# Patient Record
Sex: Female | Born: 1945 | Race: White | Hispanic: No | Marital: Single | Smoking: Never smoker
Health system: Southern US, Community
[De-identification: ages and names within clinical notes are randomized; demographics above are authoritative.]

## PROBLEM LIST (undated history)

## (undated) ENCOUNTER — Inpatient Hospital Stay: Admission: EM | Payer: Self-pay | Source: Home / Self Care

## (undated) DIAGNOSIS — K219 Gastro-esophageal reflux disease without esophagitis: Secondary | ICD-10-CM

## (undated) DIAGNOSIS — C801 Malignant (primary) neoplasm, unspecified: Secondary | ICD-10-CM

## (undated) DIAGNOSIS — G4730 Sleep apnea, unspecified: Secondary | ICD-10-CM

## (undated) DIAGNOSIS — F32A Depression, unspecified: Secondary | ICD-10-CM

## (undated) DIAGNOSIS — I2510 Atherosclerotic heart disease of native coronary artery without angina pectoris: Secondary | ICD-10-CM

## (undated) DIAGNOSIS — J45909 Unspecified asthma, uncomplicated: Secondary | ICD-10-CM

## (undated) DIAGNOSIS — G20 Parkinson's disease: Secondary | ICD-10-CM

## (undated) DIAGNOSIS — I10 Essential (primary) hypertension: Secondary | ICD-10-CM

## (undated) DIAGNOSIS — I8390 Asymptomatic varicose veins of unspecified lower extremity: Secondary | ICD-10-CM

## (undated) DIAGNOSIS — K3184 Gastroparesis: Secondary | ICD-10-CM

## (undated) DIAGNOSIS — H538 Other visual disturbances: Secondary | ICD-10-CM

## (undated) DIAGNOSIS — M1990 Unspecified osteoarthritis, unspecified site: Secondary | ICD-10-CM

## (undated) DIAGNOSIS — M797 Fibromyalgia: Secondary | ICD-10-CM

## (undated) DIAGNOSIS — E785 Hyperlipidemia, unspecified: Secondary | ICD-10-CM

## (undated) DIAGNOSIS — F419 Anxiety disorder, unspecified: Secondary | ICD-10-CM

## (undated) DIAGNOSIS — D333 Benign neoplasm of cranial nerves: Secondary | ICD-10-CM

## (undated) DIAGNOSIS — G20A1 Parkinson's disease without dyskinesia, without mention of fluctuations: Secondary | ICD-10-CM

## (undated) DIAGNOSIS — K449 Diaphragmatic hernia without obstruction or gangrene: Secondary | ICD-10-CM

## (undated) DIAGNOSIS — R51 Headache: Secondary | ICD-10-CM

## (undated) DIAGNOSIS — I209 Angina pectoris, unspecified: Secondary | ICD-10-CM

## (undated) DIAGNOSIS — F329 Major depressive disorder, single episode, unspecified: Secondary | ICD-10-CM

## (undated) HISTORY — PX: COLON SURGERY: SHX602

## (undated) HISTORY — PX: KNEE ARTHROSCOPY: SUR90

## (undated) HISTORY — DX: Anxiety disorder, unspecified: F41.9

## (undated) HISTORY — PX: PORTACATH PLACEMENT: SHX2246

## (undated) HISTORY — PX: HERNIA REPAIR: SHX51

## (undated) HISTORY — PX: MANDIBLE SURGERY: SHX707

## (undated) HISTORY — DX: Fibromyalgia: M79.7

## (undated) HISTORY — PX: CATARACT EXTRACTION: SUR2

## (undated) HISTORY — DX: Malignant (primary) neoplasm, unspecified: C80.1

## (undated) HISTORY — DX: Gastroparesis: K31.84

## (undated) HISTORY — PX: EXCISION MORTON'S NEUROMA: SHX5013

## (undated) HISTORY — PX: TONSILLECTOMY: SUR1361

## (undated) HISTORY — DX: Unspecified osteoarthritis, unspecified site: M19.90

## (undated) HISTORY — PX: JOINT REPLACEMENT: SHX530

## (undated) HISTORY — PX: APPENDECTOMY: SHX54

## (undated) HISTORY — PX: LAPAROTOMY: SHX154

## (undated) HISTORY — PX: CARDIAC CATHETERIZATION: SHX172

## (undated) HISTORY — DX: Hyperlipidemia, unspecified: E78.5

## (undated) HISTORY — DX: Atherosclerotic heart disease of native coronary artery without angina pectoris: I25.10

## (undated) HISTORY — DX: Morbid (severe) obesity due to excess calories: E66.01

## (undated) HISTORY — DX: Depression, unspecified: F32.A

## (undated) HISTORY — DX: Diaphragmatic hernia without obstruction or gangrene: K44.9

## (undated) HISTORY — DX: Gastro-esophageal reflux disease without esophagitis: K21.9

## (undated) HISTORY — DX: Major depressive disorder, single episode, unspecified: F32.9

## (undated) HISTORY — PX: EYE SURGERY: SHX253

## (undated) HISTORY — DX: Essential (primary) hypertension: I10

## (undated) HISTORY — DX: Asymptomatic varicose veins of unspecified lower extremity: I83.90

## (undated) HISTORY — PX: FETAL SURGERY FOR CONGENITAL HERNIA: SHX1618

## (undated) HISTORY — PX: OTHER SURGICAL HISTORY: SHX169

## (undated) HISTORY — PX: REPLACEMENT TOTAL KNEE: SUR1224

## (undated) HISTORY — DX: Other visual disturbances: H53.8

## (undated) HISTORY — PX: FINGER REPLANTATION: SHX639

## (undated) HISTORY — PX: BACK SURGERY: SHX140

---

## 1964-02-26 HISTORY — PX: CHOLECYSTECTOMY: SHX55

## 1991-02-26 HISTORY — PX: ABDOMINAL HYSTERECTOMY: SHX81

## 1991-02-26 HISTORY — PX: HYSTEROTOMY: SHX1776

## 2001-10-09 ENCOUNTER — Encounter: Payer: Self-pay | Admitting: General Surgery

## 2001-10-09 ENCOUNTER — Encounter: Admission: RE | Admit: 2001-10-09 | Discharge: 2001-10-09 | Payer: Self-pay | Admitting: General Surgery

## 2001-10-19 ENCOUNTER — Encounter: Admission: RE | Admit: 2001-10-19 | Discharge: 2001-10-19 | Payer: Self-pay | Admitting: Internal Medicine

## 2001-10-19 ENCOUNTER — Encounter: Payer: Self-pay | Admitting: Internal Medicine

## 2001-11-12 ENCOUNTER — Encounter: Admission: RE | Admit: 2001-11-12 | Discharge: 2001-11-12 | Payer: Self-pay | Admitting: General Surgery

## 2001-11-12 ENCOUNTER — Encounter: Payer: Self-pay | Admitting: General Surgery

## 2001-11-16 ENCOUNTER — Encounter (INDEPENDENT_AMBULATORY_CARE_PROVIDER_SITE_OTHER): Payer: Self-pay | Admitting: Specialist

## 2001-11-16 ENCOUNTER — Ambulatory Visit (HOSPITAL_BASED_OUTPATIENT_CLINIC_OR_DEPARTMENT_OTHER): Admission: RE | Admit: 2001-11-16 | Discharge: 2001-11-17 | Payer: Self-pay | Admitting: General Surgery

## 2001-12-23 ENCOUNTER — Inpatient Hospital Stay (HOSPITAL_COMMUNITY): Admission: RE | Admit: 2001-12-23 | Discharge: 2001-12-30 | Payer: Self-pay | Admitting: General Surgery

## 2002-03-30 ENCOUNTER — Encounter: Payer: Self-pay | Admitting: Internal Medicine

## 2002-03-30 ENCOUNTER — Encounter: Admission: RE | Admit: 2002-03-30 | Discharge: 2002-03-30 | Payer: Self-pay | Admitting: Internal Medicine

## 2002-10-21 ENCOUNTER — Encounter: Payer: Self-pay | Admitting: General Surgery

## 2002-10-21 ENCOUNTER — Encounter: Admission: RE | Admit: 2002-10-21 | Discharge: 2002-10-21 | Payer: Self-pay | Admitting: General Surgery

## 2004-07-06 ENCOUNTER — Ambulatory Visit: Payer: Self-pay | Admitting: Cardiology

## 2004-07-13 ENCOUNTER — Inpatient Hospital Stay (HOSPITAL_BASED_OUTPATIENT_CLINIC_OR_DEPARTMENT_OTHER): Admission: RE | Admit: 2004-07-13 | Discharge: 2004-07-13 | Payer: Self-pay | Admitting: Cardiology

## 2004-07-20 ENCOUNTER — Ambulatory Visit: Payer: Self-pay | Admitting: Cardiology

## 2004-07-25 ENCOUNTER — Ambulatory Visit: Payer: Self-pay | Admitting: Cardiology

## 2004-07-26 ENCOUNTER — Ambulatory Visit: Payer: Self-pay | Admitting: Cardiology

## 2004-08-17 ENCOUNTER — Ambulatory Visit: Payer: Self-pay | Admitting: Cardiology

## 2004-09-07 ENCOUNTER — Ambulatory Visit (HOSPITAL_COMMUNITY): Admission: RE | Admit: 2004-09-07 | Discharge: 2004-09-08 | Payer: Self-pay | Admitting: Cardiology

## 2004-09-07 ENCOUNTER — Ambulatory Visit: Payer: Self-pay | Admitting: Cardiology

## 2004-09-20 ENCOUNTER — Ambulatory Visit: Payer: Self-pay | Admitting: Cardiology

## 2004-09-26 ENCOUNTER — Ambulatory Visit: Payer: Self-pay | Admitting: Cardiology

## 2004-10-04 ENCOUNTER — Ambulatory Visit: Payer: Self-pay | Admitting: Cardiology

## 2005-10-31 ENCOUNTER — Encounter: Admission: RE | Admit: 2005-10-31 | Discharge: 2005-10-31 | Payer: Self-pay | Admitting: General Surgery

## 2005-11-21 ENCOUNTER — Ambulatory Visit: Payer: Self-pay | Admitting: Cardiology

## 2005-11-28 ENCOUNTER — Ambulatory Visit: Payer: Self-pay | Admitting: Cardiology

## 2006-01-13 ENCOUNTER — Ambulatory Visit: Payer: Self-pay

## 2006-01-22 HISTORY — PX: OTHER SURGICAL HISTORY: SHX169

## 2006-01-23 ENCOUNTER — Inpatient Hospital Stay (HOSPITAL_COMMUNITY): Admission: RE | Admit: 2006-01-23 | Discharge: 2006-01-25 | Payer: Self-pay | Admitting: General Surgery

## 2006-03-13 ENCOUNTER — Ambulatory Visit: Payer: Self-pay | Admitting: Cardiology

## 2006-05-20 ENCOUNTER — Ambulatory Visit: Payer: Self-pay | Admitting: Cardiology

## 2006-08-08 ENCOUNTER — Ambulatory Visit: Payer: Self-pay | Admitting: Cardiovascular Disease

## 2006-08-12 ENCOUNTER — Ambulatory Visit: Payer: Self-pay

## 2007-02-06 ENCOUNTER — Encounter: Payer: Self-pay | Admitting: Gastroenterology

## 2007-07-07 ENCOUNTER — Ambulatory Visit (HOSPITAL_COMMUNITY): Admission: RE | Admit: 2007-07-07 | Discharge: 2007-07-07 | Payer: Self-pay | Admitting: Internal Medicine

## 2007-07-13 ENCOUNTER — Encounter (HOSPITAL_COMMUNITY): Admission: RE | Admit: 2007-07-13 | Discharge: 2007-08-12 | Payer: Self-pay | Admitting: Internal Medicine

## 2007-08-05 ENCOUNTER — Ambulatory Visit: Payer: Self-pay | Admitting: Cardiovascular Disease

## 2007-08-05 ENCOUNTER — Inpatient Hospital Stay (HOSPITAL_COMMUNITY): Admission: EM | Admit: 2007-08-05 | Discharge: 2007-08-08 | Payer: Self-pay | Admitting: Emergency Medicine

## 2007-08-06 ENCOUNTER — Encounter: Payer: Self-pay | Admitting: Gastroenterology

## 2007-08-07 ENCOUNTER — Encounter: Payer: Self-pay | Admitting: Internal Medicine

## 2007-08-11 ENCOUNTER — Encounter: Payer: Self-pay | Admitting: Gastroenterology

## 2007-08-12 ENCOUNTER — Ambulatory Visit: Payer: Self-pay | Admitting: Gastroenterology

## 2007-09-22 ENCOUNTER — Encounter: Payer: Self-pay | Admitting: Gastroenterology

## 2007-09-23 ENCOUNTER — Ambulatory Visit: Payer: Self-pay | Admitting: Gastroenterology

## 2007-09-23 DIAGNOSIS — D126 Benign neoplasm of colon, unspecified: Secondary | ICD-10-CM

## 2007-09-23 DIAGNOSIS — R195 Other fecal abnormalities: Secondary | ICD-10-CM | POA: Insufficient documentation

## 2007-09-23 DIAGNOSIS — E119 Type 2 diabetes mellitus without complications: Secondary | ICD-10-CM | POA: Insufficient documentation

## 2007-09-23 DIAGNOSIS — K3184 Gastroparesis: Secondary | ICD-10-CM

## 2007-09-23 DIAGNOSIS — D509 Iron deficiency anemia, unspecified: Secondary | ICD-10-CM | POA: Insufficient documentation

## 2007-09-23 DIAGNOSIS — IMO0001 Reserved for inherently not codable concepts without codable children: Secondary | ICD-10-CM

## 2007-09-23 LAB — CONVERTED CEMR LAB
Basophils Absolute: 0.1 10*3/uL (ref 0.0–0.1)
Basophils Relative: 1.1 % (ref 0.0–3.0)
Eosinophils Absolute: 0.3 10*3/uL (ref 0.0–0.7)
Lymphocytes Relative: 19.5 % (ref 12.0–46.0)
MCHC: 32.6 g/dL (ref 30.0–36.0)
Neutrophils Relative %: 70.4 % (ref 43.0–77.0)
Platelets: 378 10*3/uL (ref 150–400)
RBC: 3.81 M/uL — ABNORMAL LOW (ref 3.87–5.11)
WBC: 8.3 10*3/uL (ref 4.5–10.5)

## 2007-11-11 ENCOUNTER — Encounter: Payer: Self-pay | Admitting: Gastroenterology

## 2007-11-13 ENCOUNTER — Ambulatory Visit: Payer: Self-pay | Admitting: Gastroenterology

## 2007-11-13 ENCOUNTER — Encounter: Payer: Self-pay | Admitting: Gastroenterology

## 2007-12-04 ENCOUNTER — Telehealth: Payer: Self-pay | Admitting: Gastroenterology

## 2007-12-07 DIAGNOSIS — C182 Malignant neoplasm of ascending colon: Secondary | ICD-10-CM

## 2007-12-08 ENCOUNTER — Ambulatory Visit: Payer: Self-pay | Admitting: Gastroenterology

## 2007-12-08 LAB — CONVERTED CEMR LAB: Creatinine, Ser: 1.3 mg/dL — ABNORMAL HIGH (ref 0.4–1.2)

## 2007-12-10 ENCOUNTER — Encounter: Payer: Self-pay | Admitting: Gastroenterology

## 2007-12-17 ENCOUNTER — Encounter: Payer: Self-pay | Admitting: Gastroenterology

## 2007-12-28 ENCOUNTER — Inpatient Hospital Stay (HOSPITAL_COMMUNITY): Admission: RE | Admit: 2007-12-28 | Discharge: 2007-12-31 | Payer: Self-pay | Admitting: Surgery

## 2007-12-28 ENCOUNTER — Encounter (INDEPENDENT_AMBULATORY_CARE_PROVIDER_SITE_OTHER): Payer: Self-pay | Admitting: Surgery

## 2007-12-28 HISTORY — PX: HEMICOLECTOMY: SHX854

## 2008-01-06 ENCOUNTER — Ambulatory Visit: Payer: Self-pay | Admitting: Hematology and Oncology

## 2008-01-07 ENCOUNTER — Inpatient Hospital Stay (HOSPITAL_COMMUNITY): Admission: AD | Admit: 2008-01-07 | Discharge: 2008-01-13 | Payer: Self-pay | Admitting: Surgery

## 2008-01-20 ENCOUNTER — Encounter: Payer: Self-pay | Admitting: Gastroenterology

## 2008-02-02 ENCOUNTER — Encounter: Payer: Self-pay | Admitting: Gastroenterology

## 2008-03-07 ENCOUNTER — Ambulatory Visit (HOSPITAL_COMMUNITY): Admission: RE | Admit: 2008-03-07 | Discharge: 2008-03-07 | Payer: Self-pay | Admitting: Internal Medicine

## 2008-05-25 ENCOUNTER — Encounter: Payer: Self-pay | Admitting: Gastroenterology

## 2008-09-13 ENCOUNTER — Ambulatory Visit: Payer: Self-pay | Admitting: Cardiovascular Disease

## 2008-09-13 ENCOUNTER — Encounter: Payer: Self-pay | Admitting: Cardiovascular Disease

## 2008-09-13 DIAGNOSIS — R079 Chest pain, unspecified: Secondary | ICD-10-CM | POA: Insufficient documentation

## 2008-09-14 DIAGNOSIS — I2510 Atherosclerotic heart disease of native coronary artery without angina pectoris: Secondary | ICD-10-CM | POA: Insufficient documentation

## 2008-09-14 LAB — CONVERTED CEMR LAB
Basophils Absolute: 0 10*3/uL (ref 0.0–0.1)
Basophils Relative: 0.1 % (ref 0.0–3.0)
Calcium: 10 mg/dL (ref 8.4–10.5)
Eosinophils Relative: 4.1 % (ref 0.0–5.0)
GFR calc non Af Amer: 76.39 mL/min (ref >60)
Glucose, Bld: 123 mg/dL — ABNORMAL HIGH (ref 70–99)
HCT: 37.4 % (ref 36.0–46.0)
Hemoglobin: 12.8 g/dL (ref 12.0–15.0)
Lymphocytes Relative: 34.9 % (ref 12.0–46.0)
Lymphs Abs: 2.3 10*3/uL (ref 0.7–4.0)
Monocytes Relative: 5.5 % (ref 3.0–12.0)
Neutro Abs: 3.7 10*3/uL (ref 1.4–7.7)
Potassium: 5.3 meq/L — ABNORMAL HIGH (ref 3.5–5.1)
RBC: 4.11 M/uL (ref 3.87–5.11)
RDW: 13 % (ref 11.5–14.6)
Sodium: 140 meq/L (ref 135–145)
WBC: 6.7 10*3/uL (ref 4.5–10.5)
aPTT: 29.3 s — ABNORMAL HIGH (ref 21.7–28.8)

## 2008-09-15 ENCOUNTER — Ambulatory Visit: Payer: Self-pay | Admitting: Cardiovascular Disease

## 2008-09-15 ENCOUNTER — Inpatient Hospital Stay (HOSPITAL_COMMUNITY): Admission: RE | Admit: 2008-09-15 | Discharge: 2008-09-16 | Payer: Self-pay | Admitting: Cardiovascular Disease

## 2008-09-20 ENCOUNTER — Telehealth (INDEPENDENT_AMBULATORY_CARE_PROVIDER_SITE_OTHER): Payer: Self-pay | Admitting: *Deleted

## 2008-09-21 ENCOUNTER — Encounter (INDEPENDENT_AMBULATORY_CARE_PROVIDER_SITE_OTHER): Payer: Self-pay | Admitting: Nurse Practitioner

## 2008-09-22 ENCOUNTER — Encounter (INDEPENDENT_AMBULATORY_CARE_PROVIDER_SITE_OTHER): Payer: Self-pay | Admitting: *Deleted

## 2008-09-27 ENCOUNTER — Ambulatory Visit: Payer: Self-pay | Admitting: Cardiology

## 2008-09-27 ENCOUNTER — Encounter: Payer: Self-pay | Admitting: Physician Assistant

## 2008-09-27 DIAGNOSIS — I5032 Chronic diastolic (congestive) heart failure: Secondary | ICD-10-CM | POA: Insufficient documentation

## 2008-10-10 ENCOUNTER — Telehealth: Payer: Self-pay | Admitting: Cardiovascular Disease

## 2008-10-12 ENCOUNTER — Encounter: Payer: Self-pay | Admitting: Cardiovascular Disease

## 2008-10-14 ENCOUNTER — Encounter: Payer: Self-pay | Admitting: Cardiovascular Disease

## 2008-10-26 ENCOUNTER — Ambulatory Visit: Payer: Self-pay | Admitting: Pulmonary Disease

## 2008-10-26 DIAGNOSIS — R059 Cough, unspecified: Secondary | ICD-10-CM | POA: Insufficient documentation

## 2008-10-26 DIAGNOSIS — R05 Cough: Secondary | ICD-10-CM

## 2008-11-11 ENCOUNTER — Encounter (INDEPENDENT_AMBULATORY_CARE_PROVIDER_SITE_OTHER): Payer: Self-pay | Admitting: *Deleted

## 2008-11-25 ENCOUNTER — Encounter (INDEPENDENT_AMBULATORY_CARE_PROVIDER_SITE_OTHER): Payer: Self-pay | Admitting: *Deleted

## 2008-12-14 ENCOUNTER — Ambulatory Visit: Payer: Self-pay | Admitting: Gastroenterology

## 2008-12-14 DIAGNOSIS — Z85038 Personal history of other malignant neoplasm of large intestine: Secondary | ICD-10-CM | POA: Insufficient documentation

## 2008-12-19 ENCOUNTER — Telehealth: Payer: Self-pay | Admitting: Gastroenterology

## 2008-12-27 ENCOUNTER — Encounter (INDEPENDENT_AMBULATORY_CARE_PROVIDER_SITE_OTHER): Payer: Self-pay | Admitting: *Deleted

## 2009-01-03 ENCOUNTER — Ambulatory Visit: Payer: Self-pay | Admitting: Gastroenterology

## 2009-01-03 ENCOUNTER — Encounter: Payer: Self-pay | Admitting: Gastroenterology

## 2009-01-12 ENCOUNTER — Ambulatory Visit (HOSPITAL_COMMUNITY): Admission: RE | Admit: 2009-01-12 | Discharge: 2009-01-12 | Payer: Self-pay | Admitting: Hematology and Oncology

## 2009-01-23 ENCOUNTER — Encounter: Payer: Self-pay | Admitting: Gastroenterology

## 2009-01-27 ENCOUNTER — Telehealth: Payer: Self-pay | Admitting: Cardiovascular Disease

## 2009-01-31 ENCOUNTER — Telehealth: Payer: Self-pay | Admitting: Cardiovascular Disease

## 2009-02-01 ENCOUNTER — Encounter (INDEPENDENT_AMBULATORY_CARE_PROVIDER_SITE_OTHER): Payer: Self-pay | Admitting: *Deleted

## 2009-02-06 ENCOUNTER — Ambulatory Visit: Payer: Self-pay | Admitting: Cardiovascular Disease

## 2009-02-06 DIAGNOSIS — I10 Essential (primary) hypertension: Secondary | ICD-10-CM | POA: Insufficient documentation

## 2009-02-07 ENCOUNTER — Telehealth (INDEPENDENT_AMBULATORY_CARE_PROVIDER_SITE_OTHER): Payer: Self-pay | Admitting: *Deleted

## 2009-02-08 ENCOUNTER — Ambulatory Visit: Payer: Self-pay | Admitting: Cardiology

## 2009-02-08 ENCOUNTER — Ambulatory Visit: Payer: Self-pay

## 2009-02-14 ENCOUNTER — Encounter (HOSPITAL_COMMUNITY): Admission: RE | Admit: 2009-02-14 | Discharge: 2009-05-01 | Payer: Self-pay | Admitting: Cardiovascular Disease

## 2009-02-14 ENCOUNTER — Encounter: Payer: Self-pay | Admitting: Cardiology

## 2009-02-14 ENCOUNTER — Ambulatory Visit: Payer: Self-pay

## 2009-02-22 ENCOUNTER — Encounter: Admission: RE | Admit: 2009-02-22 | Discharge: 2009-02-22 | Payer: Self-pay | Admitting: Surgery

## 2009-02-22 ENCOUNTER — Encounter (INDEPENDENT_AMBULATORY_CARE_PROVIDER_SITE_OTHER): Payer: Self-pay | Admitting: *Deleted

## 2009-02-27 ENCOUNTER — Telehealth: Payer: Self-pay | Admitting: Gastroenterology

## 2009-03-14 ENCOUNTER — Ambulatory Visit (HOSPITAL_COMMUNITY): Admission: RE | Admit: 2009-03-14 | Discharge: 2009-03-14 | Payer: Self-pay | Admitting: Anesthesiology

## 2009-03-15 ENCOUNTER — Ambulatory Visit: Payer: Self-pay | Admitting: Gastroenterology

## 2009-03-15 ENCOUNTER — Ambulatory Visit (HOSPITAL_COMMUNITY): Admission: RE | Admit: 2009-03-15 | Discharge: 2009-03-15 | Payer: Self-pay | Admitting: Gastroenterology

## 2009-03-16 ENCOUNTER — Inpatient Hospital Stay (HOSPITAL_COMMUNITY): Admission: RE | Admit: 2009-03-16 | Discharge: 2009-03-21 | Payer: Self-pay | Admitting: Surgery

## 2009-03-16 ENCOUNTER — Encounter (INDEPENDENT_AMBULATORY_CARE_PROVIDER_SITE_OTHER): Payer: Self-pay | Admitting: Surgery

## 2009-03-16 DIAGNOSIS — C189 Malignant neoplasm of colon, unspecified: Secondary | ICD-10-CM | POA: Insufficient documentation

## 2009-03-25 ENCOUNTER — Inpatient Hospital Stay (HOSPITAL_COMMUNITY): Admission: EM | Admit: 2009-03-25 | Discharge: 2009-03-30 | Payer: Self-pay | Admitting: Emergency Medicine

## 2009-03-25 ENCOUNTER — Encounter: Payer: Self-pay | Admitting: Emergency Medicine

## 2009-04-11 ENCOUNTER — Encounter: Payer: Self-pay | Admitting: Gastroenterology

## 2009-04-18 ENCOUNTER — Encounter: Payer: Self-pay | Admitting: Gastroenterology

## 2009-04-24 ENCOUNTER — Encounter: Payer: Self-pay | Admitting: Gastroenterology

## 2009-04-24 ENCOUNTER — Encounter: Payer: Self-pay | Admitting: Cardiovascular Disease

## 2009-04-24 ENCOUNTER — Telehealth: Payer: Self-pay | Admitting: Cardiovascular Disease

## 2009-05-03 ENCOUNTER — Ambulatory Visit (HOSPITAL_COMMUNITY): Admission: RE | Admit: 2009-05-03 | Discharge: 2009-05-03 | Payer: Self-pay | Admitting: Hematology and Oncology

## 2009-05-19 ENCOUNTER — Telehealth: Payer: Self-pay | Admitting: Cardiovascular Disease

## 2009-05-22 ENCOUNTER — Encounter: Payer: Self-pay | Admitting: Gastroenterology

## 2009-06-05 ENCOUNTER — Encounter: Payer: Self-pay | Admitting: Gastroenterology

## 2009-06-05 ENCOUNTER — Encounter: Payer: Self-pay | Admitting: Cardiovascular Disease

## 2009-07-10 ENCOUNTER — Encounter: Payer: Self-pay | Admitting: Gastroenterology

## 2009-08-04 ENCOUNTER — Encounter: Payer: Self-pay | Admitting: Cardiovascular Disease

## 2009-08-04 ENCOUNTER — Encounter: Payer: Self-pay | Admitting: Gastroenterology

## 2009-08-07 ENCOUNTER — Ambulatory Visit: Payer: Self-pay | Admitting: Cardiovascular Disease

## 2009-08-31 ENCOUNTER — Encounter: Payer: Self-pay | Admitting: Gastroenterology

## 2009-09-18 ENCOUNTER — Telehealth (INDEPENDENT_AMBULATORY_CARE_PROVIDER_SITE_OTHER): Payer: Self-pay | Admitting: *Deleted

## 2009-09-20 ENCOUNTER — Ambulatory Visit: Payer: Self-pay | Admitting: Gastroenterology

## 2009-12-22 ENCOUNTER — Ambulatory Visit (HOSPITAL_COMMUNITY): Admission: RE | Admit: 2009-12-22 | Discharge: 2009-12-22 | Payer: Self-pay | Admitting: Family Medicine

## 2010-01-12 ENCOUNTER — Encounter (INDEPENDENT_AMBULATORY_CARE_PROVIDER_SITE_OTHER): Payer: Self-pay | Admitting: *Deleted

## 2010-02-23 ENCOUNTER — Encounter (INDEPENDENT_AMBULATORY_CARE_PROVIDER_SITE_OTHER): Payer: Self-pay | Admitting: *Deleted

## 2010-03-01 ENCOUNTER — Encounter (INDEPENDENT_AMBULATORY_CARE_PROVIDER_SITE_OTHER): Payer: Self-pay | Admitting: *Deleted

## 2010-03-01 ENCOUNTER — Ambulatory Visit
Admission: RE | Admit: 2010-03-01 | Discharge: 2010-03-01 | Payer: Self-pay | Source: Home / Self Care | Attending: Gastroenterology | Admitting: Gastroenterology

## 2010-03-15 ENCOUNTER — Other Ambulatory Visit: Payer: Self-pay | Admitting: Gastroenterology

## 2010-03-15 ENCOUNTER — Ambulatory Visit
Admission: RE | Admit: 2010-03-15 | Discharge: 2010-03-15 | Payer: Self-pay | Source: Home / Self Care | Attending: Gastroenterology | Admitting: Gastroenterology

## 2010-03-18 ENCOUNTER — Encounter: Payer: Self-pay | Admitting: Gastroenterology

## 2010-03-19 LAB — GLUCOSE, CAPILLARY: Glucose-Capillary: 190 mg/dL — ABNORMAL HIGH (ref 70–99)

## 2010-03-21 ENCOUNTER — Encounter: Payer: Self-pay | Admitting: Gastroenterology

## 2010-03-29 NOTE — Miscellaneous (Signed)
Summary: [facility] previsit  Clinical Lists Changes  Medications: Added new medication of MOVIPREP 100 GM  SOLR (PEG-KCL-NACL-NASULF-NA ASC-C) As per prep instructions. - Signed Rx of MOVIPREP 100 GM  SOLR (PEG-KCL-NACL-NASULF-NA ASC-C) As per prep instructions.;  #1 x 0;  Signed;  Entered by: Karl Bales RN;  Authorized by: Louis Meckel MD;  Method used: Electronically to [facility] [address] [place], [place], [place]  [address], Ph: [phone] or [phone], Fax: [phone] Allergies: Removed allergy or adverse reaction of * IVP DYE Observations: Added new observation of ALLERGY REV: Done (03/01/2010 9:23)    Prescriptions: MOVIPREP 100 GM  SOLR (PEG-KCL-NACL-NASULF-NA ASC-C) As per prep instructions.  #1 x 0   Entered by:   Karl Bales RN   Authorized by:   Louis Meckel MD   Signed by:   Karl Bales RN on 03/01/2010   Method used:   Electronically to        [facility] [address] (retail)       [address]       [place], [place]  [address]       Ph: [phone] or [phone]       Fax: [phone]   RxID:   [id number]

## 2010-03-29 NOTE — Letter (Signed)
Summary: [facility]  [facility]   Imported By: Sherian Rein 07/27/2009 15:16:00  _____________________________________________________________________  External Attachment:    Type:   Image     Comment:   External Document

## 2010-03-29 NOTE — Letter (Signed)
Summary: [facility] Note  [facility] Note   Imported By: Roderic Ovens 05/11/2009 11:35:16  _____________________________________________________________________  External Attachment:    Type:   Image     Comment:   External Document

## 2010-03-29 NOTE — Progress Notes (Signed)
Summary: Need Plavix  ordered from [facility]  Phone Note Call from Patient Call back at [facility] Phone [phone]   Caller: Patient Summary of Call: Pt need her Plavix ordeer from [employer]. Initial call taken by: Judie Grieve,  May 19, 2009 1:56 PM  Follow-up for Phone Call        CMA s/w rep at [facility] and CMA ordered plavix.. Rep stated rx should be in our office in 3-5 days. Danielle Rankin, CMA  May 19, 2009 3:28 PM      Appended Document: Need Plavix  ordered from [facility] Plavix arrived in the office.  LMOM for pt that medication has been placed at the front desk for pick-up. Customer PO  number [phone].  Order number [id number].

## 2010-03-29 NOTE — Letter (Signed)
Summary: [facility]   Imported By: Lester [place] 04/28/2009 08:31:32  _____________________________________________________________________  External Attachment:    Type:   Image     Comment:   External Document

## 2010-03-29 NOTE — Procedures (Addendum)
Summary: Colonoscopy  Patient: [name] Note: All result statuses are Final unless otherwise noted.  Tests: (1) Colonoscopy (COL)   COL Colonoscopy           DONE     [facility]     [address]     [place], [place]  [address]           COLONOSCOPY PROCEDURE REPORT           PATIENT:  [name], [name]  MR#:  [id number]     BIRTHDATE:  1945-08-19, 65 yrs. old  GENDER:  female           ENDOSCOPIST:  Barbette Hair. Arlyce Dice, MD     Referred by:  Isabel Caprice, M.D.     [name], M.D.           PROCEDURE DATE:  03/15/2010     PROCEDURE:  Colonoscopy with snare polypectomy     ASA CLASS:  Class III     INDICATIONS:  1) screening  2) history of colon cancer h/o colon     Ca 2010; recurrent Ca 2011;           MEDICATIONS:   Fentanyl 100 mcg IV, Versed 9 mg IV           DESCRIPTION OF PROCEDURE:   After the risks benefits and     alternatives of the procedure were thoroughly explained, informed     consent was obtained.  Digital rectal exam was performed and     revealed no abnormalities.   The LB CF-[id number] endoscope     was introduced through the anus and advanced to the anastomosis,     without limitations.  The quality of the prep was excellent, using     MoviPrep.  The instrument was then slowly withdrawn as the colon     was fully examined.     <<PROCEDUREIMAGES>>           FINDINGS:  A sessile polyp was found in the sigmoid colon. It was     3 mm in size. It was found 28 cm from the point of entry. Polyp     was snared without cautery. Retrieval was successful (see     image11). snare polyp  This was otherwise a normal examination of     the colon (see image1, image2, image3, image6, image7, image12,     and image13).   Retroflexed views in the rectum revealed no     abnormalities.    The time to anastamosis =  5.0  minutes. The     scope was then withdrawn (time =  10.75  min) from the patient and     the procedure completed.           COMPLICATIONS:   None           ENDOSCOPIC IMPRESSION:     1) 3 mm sessile polyp in the sigmoid colon     2) Otherwise normal examination     RECOMMENDATIONS:     1) Colonoscopy 3 years           REPEAT EXAM:   3 year(s) Colonoscopy           ______________________________     Barbette Hair. Arlyce Dice, MD           CC:           n.     eSIGNED:   Barbette Hair. Arlyce Dice at  03/15/2010 09:34 AM           Kendzierski, [name], [phone]  Note: An exclamation mark (!) indicates a result that was not dispersed into the flowsheet. Document Creation Date: 03/15/2010 9:35 AM _______________________________________________________________________  (1) Order result status: Final Collection or observation date-time: 03/15/2010 09:27 Requested date-time:  Receipt date-time:  Reported date-time:  Referring Physician:   Ordering Physician: Melvia Heaps [phone]) Specimen Source:  Source: Launa Grill Order Number: [id number] Lab site:   Appended Document: Colonoscopy     Procedures Next Due Date:    Colonoscopy: 02/2013

## 2010-03-29 NOTE — Miscellaneous (Signed)
  Clinical Lists Changes  Observations: Added new observation of NUCLEAR NOS: Exercise Capacity: Lexiscan study with no exercise. ECG Impression: No significant ST segment change suggestive of ischemia. Overall Impression: There is mild apical thinning but  no sign of scar or ischemia. (02/08/2009 12:24)      Nuclear Study  Procedure date:  02/08/2009  Findings:      Exercise Capacity: Lexiscan study with no exercise. ECG Impression: No significant ST segment change suggestive of ischemia. Overall Impression: There is mild apical thinning but  no sign of scar or ischemia.

## 2010-03-29 NOTE — Progress Notes (Signed)
  Phone Note From Other Clinic   Caller: Lafonda Mosses [facility]  [phone] option [phone] Call For: Dr. Arlyce Dice Initial call taken by: Karna Christmas,  September 18, 2009 3:58 PM

## 2010-03-29 NOTE — Progress Notes (Signed)
Summary: COLON/TATTOO SCHEDULED  Phone Note From Other Clinic   Caller: Dr.Gross nurse--Alisha Summary of Call: Pt. had a Colonoscopy on 01-03-09, recurrent CA found.  Dr.Gross ordered a BE, site not identified. Dr.Gross wants pt. to have a Colonoscopy on 03-15-09 and have Dr.Kaplan tattoo the area for pt. surgery scheduled on 03-16-09. Elease Hashimoto will give pt. prep instructions and Plavix directions. The procedure is scheduled for 03-15-09 at 8:30am at [facility]. Message left for patient to callback.  Initial call taken by: Laureen Ochs LPN,  February 27, 2009 2:23 PM  Follow-up for Phone Call        All instructions reviewed with pt. by phone. Pt. instructed to call back as needed.  Follow-up by: Laureen Ochs LPN,  February 27, 2009 4:14 PM

## 2010-03-29 NOTE — Letter (Signed)
Summary: [facility]  [facility]   Imported By: Lester [place] 06/15/2009 09:49:15  _____________________________________________________________________  External Attachment:    Type:   Image     Comment:   External Document

## 2010-03-29 NOTE — Letter (Signed)
Summary: [facility]  [facility]   Imported By: Lester [place] 04/28/2009 08:49:22  _____________________________________________________________________  External Attachment:    Type:   Image     Comment:   External Document

## 2010-03-29 NOTE — Letter (Signed)
Summary: Results Letter  [facility]  [address] [place], [place] [address]   Phone: [phone]  Fax: [phone]        March 21, 2010 MRN: [id number]    [facility] [address] [place], [place]  [address]    Dear Ms. Keir,  Your colon biopsy results did not show any remarkable findings. In view of your history of colon cancer, however, I recommend followup colonoscopy in 3 years. Please continue with the recommendations previously discussed.  Should you have any further questions or immediate concers, feel free to contact me.  Sincerely,  Barbette Hair. Arlyce Dice, M.D., [facility]          Sincerely,  Louis Meckel MD  This letter has been electronically signed by your physician.  Appended Document: Results Letter LETTER MAILED

## 2010-03-29 NOTE — Assessment & Plan Note (Signed)
Summary: discuss colon//hx of COL cancer--ch.   History of Present Illness Visit Type: Follow-up Visit Primary GI MD: Melvia Heaps MD [facility] Primary Provider: [name], MD Requesting Provider: Isabel Caprice, MD Chief Complaint: Discuss colonoscopy/hx. of Colon Ca  c/o bilateral hernias hurting. History of Present Illness:   Ms. [name] has returned for followup of her colon cancer.  She underwent a segmental resection of the transverse colon in January, 2011.  This was followed by chemotherapy.  She has no specific GI complaints at this time.   GI Review of Systems      Denies abdominal pain, acid reflux, belching, bloating, chest pain, dysphagia with liquids, dysphagia with solids, heartburn, loss of appetite, nausea, vomiting, vomiting blood, weight loss, and  weight gain.        Denies anal fissure, black tarry stools, change in bowel habit, constipation, diarrhea, diverticulosis, fecal incontinence, heme positive stool, hemorrhoids, irritable bowel syndrome, jaundice, light color stool, liver problems, rectal bleeding, and  rectal pain.    Current Medications (verified): 1)  Miralax   Powd (Polyethylene Glycol 3350) .Marland KitchenMarland KitchenMarland Kitchen 17 Grams Daily 2)  Aspirin 81 Mg  Tbec (Aspirin) .... One Tablet Every Day 3)  Lantus 100 Unit/ml  Soln (Insulin Glargine) .... 55 Units Once A Day 4)  Metformin Hcl 1000 Mg Tabs (Metformin Hcl) .... 2 By Mouth Two Times A Day 5)  Multivitamins  Tabs (Multiple Vitamin) .Marland Kitchen.. 1 By Mouth Once Daily 6)  Folic Acid   Powd (Folic Acid) .Marland Kitchen.. 1 By Mouth Once Daily 7)  Fish Oil   Oil (Fish Oil) .Marland Kitchen.. 1 By Mouth Once Daily 8)  Nitroglycerin 0.4 Mg Subl (Nitroglycerin) .... One Tablet Under Tongue Every 5 Minutes As Needed For Chest Pain---May Repeat Times Three 9)  Furosemide 20 Mg Tabs (Furosemide) .... As Needed 10)  Crestor 10 Mg Tabs (Rosuvastatin Calcium) .... One Tablet By Mouth Once Daily 11)  Prozac 40 Mg Caps (Fluoxetine Hcl) .... Take 1 Capsule By Mouth Once A  Day 12)  Losartan Potassium 50 Mg Tabs (Losartan Potassium) .... Take One Tablet By Mouth Daily 13)  Vitamin D3 1000 Unit Tabs (Cholecalciferol) .... Take 1 Tablet By Mouth Once A Day 14)  Vitamin C 500 Mg  Tabs (Ascorbic Acid) .... Take 1 Tablet By Mouth Once A Day  Allergies (verified): 1)  ! Morphine 2)  ! Codeine 3)  ! Penicillin 4)  ! * Ivp Dye 5)  ! * Latex  Past History:  Past Medical History: Reviewed history from 02/06/2009 and no changes required. 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 Cataract Extraction Cholecystectomy Hernia Surgery Knee Arthroscopy Knee Replacement Tonsillectomy Right hemicolectomy 12/28/07 for adenocarcinoma of ascending colon Hysterectomy - 1993 Herniorrhaphy for recurrent ventral hernia -- 01/22/06 Laparotomy for small bowel obstruction resection of acoustic neuroma Stent Surgery   Review of Systems  The patient denies allergy/sinus, anemia, anxiety-new, arthritis/joint pain, back pain, blood in urine, breast changes/lumps, change in vision, confusion, cough, coughing up blood, depression-new, fainting, fatigue, fever, headaches-new, hearing problems, heart murmur, heart rhythm changes, itching,  muscle pains/cramps, night sweats, nosebleeds, shortness of breath, skin rash, sleeping problems, sore throat, swelling of feet/legs, swollen lymph glands, thirst - excessive, urination - excessive, urination  changes/pain, urine leakage, vision changes, and voice change.    Vital Signs:  Patient profile:   65 year old female Height:      65 inches Weight:      254 pounds BMI:     42.42 Pulse rate:   68 / minute Pulse rhythm:   regular BP sitting:   118 / 74  (left arm)  Vitals Entered By: Milford Cage NCMA (September 20, 2009 3:22 PM)   Impression & Recommendations:  Problem # 1:  PERSONAL HISTORY MALIG NEOPLASM LARGE INTESTINE (ICD-V10.05) She had a primary cancer  2 years ago and a metachronous lesion at last colonoscopy.  She will be scheduled  for followup colonoscopy in January, 2012.  Problem # 2:  CAD, NATIVE VESSEL (ICD-414.01) Assessment: Comment Only  Patient Instructions: 1)  Copy sent to : Isabel Caprice, MD/  [name], MD, Estelle Grumbles, MD 2)  Recall for colonoscopy in January 2012 3)  The medication list was reviewed and reconciled.  All changed / newly prescribed medications were explained.  A complete medication list was provided to the patient / caregiver.

## 2010-03-29 NOTE — Progress Notes (Signed)
Summary: come off plavix  Phone Note From Other Clinic   Caller: tina office [phone] Request: Talk with Nurse Details for Reason: pt need to come off plavix 7 days prior for porta cath.  Initial call taken by: Lorne Skeens,  April 24, 2009 2:48 PM  Follow-up for Phone Call        I spoke with Inetta Fermo and this pt is scheduled for porta cath placement on 05/03/09 at [facility].  Per protocol the pt would need to hold Plavix 7 days prior to procedure.  The pt can continue ASA.  Note should be faxed to [phone].  I will discuss this pt with Dr Excell Seltzer.  Julieta Gutting, RN, BSN  April 24, 2009 3:19 PM  Additional Follow-up for Phone Call Additional follow up Details #1::        this is ok. she should start plavix back after port-a-cath is placed Additional Follow-up by: Norva Karvonen, MD,  April 24, 2009 4:40 PM

## 2010-03-29 NOTE — Letter (Signed)
Summary: [facility]   [facility]   Imported By: Roderic Ovens 07/10/2009 13:49:28  _____________________________________________________________________  External Attachment:    Type:   Image     Comment:   External Document

## 2010-03-29 NOTE — Letter (Signed)
Summary: Diabetic Instructions  [facility]  [address]   [place], [place] [address]   Phone: [phone]  Fax: [phone]    [name] [birth year] MRN: [id number]    x    ORAL DIABETIC MEDICATION INSTRUCTIONS  The day before your procedure:   Take your diabetic pill as you do normally  The day of your procedure:   Do not take your diabetic pill    We will check your blood sugar levels during the admission process and again in Recovery before discharging you home  ________________________________________________________________________   x    INSULIN (LONG ACTING) MEDICATION INSTRUCTIONS (Lantus, NPH, 70/30, Humulin, Novolin-N)   The day before your procedure:   Take  your regular evening dose    The day of your procedure:   Do not take your morning dose    x     INSULIN (SHORT ACTING) MEDICATION INSTRUCTIONS (Regular, Humulog, Novolog)   The day before your procedure:   Do not take your evening dose   The day of your procedure:   Do not take your morning dose

## 2010-03-29 NOTE — Procedures (Signed)
Summary: Colonoscopy  Patient: [name] Note: All result statuses are Final unless otherwise noted.  Tests: (1) Colonoscopy (COL)   COL Colonoscopy           DONE     [facility]     [address] [place], [place]  [address]           COLONOSCOPY PROCEDURE REPORT           PATIENT:  [name]  MR#:  [id number]     BIRTHDATE:  1945/05/26, 64 yrs. old  GENDER:  female           ENDOSCOPIST:  Laura Hair. Arlyce Dice, MD     Referred by:           PROCEDURE DATE:  03/15/2009     PROCEDURE:  Colonoscopy with snare polypectomy, Colonoscopy with     submucosal injection     ASA CLASS:  Class II     INDICATIONS:  surgical marking recurrent adenoCa           MEDICATIONS:   Fentanyl 80 mg, Versed 8.5 mg IV, Benadryl 25 mg           DESCRIPTION OF PROCEDURE:   After the risks benefits and     alternatives of the procedure were thoroughly explained, informed     consent was obtained.  Digital rectal exam was performed and     revealed no abnormalities.   The [id number] ([id number]) endoscope was     introduced through the anus and advanced to the anastamosis,     limited by poor preparation.  Large amount of retained solid and     liquid stool  The quality of the prep was poor, using MiraLax.     The instrument was then slowly withdrawn as the colon was fully     examined.     <<PROCEDUREIMAGES>>           FINDINGS:  A mass was found. Previously described mass just distal     to anastamosis. submucosal injection 4cc [place] ink was injected in     4 separate areas around mass (see image002).  A sessile polyp was     found in the distal transverse colon. It was 3 mm in size. Polyp     was snared without cautery. Retrieval was successful. snare polyp     This was otherwise a normal examination of the colon (see image003,     image005, image006, and image008).   Retroflexed views in the     rectum revealed no abnormalities.    The scope was then withdrawn     from the patient  and the procedure completed.           COMPLICATIONS:  None           ENDOSCOPIC IMPRESSION:     1) Malignant Mass just distal to anastamosis     2) 3 mm sessile polyp in the distal transverse colon     3) Otherwise normal examination     RECOMMENDATIONS:     1)Surgery     2) reprep for surgery           REPEAT EXAM:  1 year           ______________________________     Laura Hair. Arlyce Dice, MD           CC:  Laura Soda, MD  n.     eSIGNED:   Barbette Hair. [name] at 03/15/2009 09:41 AM           [name], [id number]  Note: An exclamation mark (!) indicates a result that was not dispersed into the flowsheet. Document Creation Date: 03/15/2009 9:42 AM _______________________________________________________________________  (1) Order result status: Final Collection or observation date-time: 03/15/2009 09:29 Requested date-time:  Receipt date-time:  Reported date-time:  Referring Physician:   Ordering Physician: [name] [phone]) Specimen Source:  Source: Launa Grill Order Number: [id number] Lab site:   Appended Document: Colonoscopy Recall is in IDX for 02/2010.

## 2010-03-29 NOTE — Assessment & Plan Note (Signed)
Summary: 6 month rov   Visit Type:  6 months follow up Referring Provider:  n/a Primary Provider:  [name], MD  CC:  Some chest pains at night.  History of Present Illness: 65 year-old presenting for follow-management of her CAD. She underwent colon resection for recurrent colonic malignancy earlier this year. She finished chemotherapy last week, and is slowly regaining her strength. She denies chest pain, edema, orthopnea, or PND, but does complain of generalized fatigue and dyspnea with exertion.  Current Medications (verified): 1)  Miralax   Powd (Polyethylene Glycol 3350) .Marland KitchenMarland KitchenMarland Kitchen 17 Grams Daily 2)  Aspirin 81 Mg  Tbec (Aspirin) .... One Tablet Every Day 3)  Lantus 100 Unit/ml  Soln (Insulin Glargine) .... 55 Units Once A Day 4)  Metformin Hcl 1000 Mg Tabs (Metformin Hcl) .... 2 By Mouth Two Times A Day 5)  Multivitamins  Tabs (Multiple Vitamin) .Marland Kitchen.. 1 By Mouth Once Daily 6)  Folic Acid   Powd (Folic Acid) .Marland Kitchen.. 1 By Mouth Once Daily 7)  Fish Oil   Oil (Fish Oil) .Marland Kitchen.. 1 By Mouth Once Daily 8)  Plavix 75 Mg Tabs (Clopidogrel Bisulfate) .... Take One Daily 9)  Nitroglycerin 0.4 Mg Subl (Nitroglycerin) .... One Tablet Under Tongue Every 5 Minutes As Needed For Chest Pain---May Repeat Times Three 10)  Furosemide 20 Mg Tabs (Furosemide) .... As Needed 11)  Crestor 10 Mg Tabs (Rosuvastatin Calcium) .... One Tablet By Mouth Once Daily 12)  Prozac 40 Mg Caps (Fluoxetine Hcl) .... Take 1 Capsule By Mouth Once A Day 13)  Losartan Potassium 50 Mg Tabs (Losartan Potassium) .... Take One Tablet By Mouth Daily 14)  Vitamin D3 1000 Unit Tabs (Cholecalciferol) .... Take 1 Tablet By Mouth Once A Day 15)  Vitamin C 500 Mg  Tabs (Ascorbic Acid) .... Take 1 Tablet By Mouth Once A Day  Allergies: 1)  ! Morphine 2)  ! Codeine 3)  ! Penicillin 4)  ! * Ivp Dye 5)  ! * Latex  Past History:  Past medical history reviewed for relevance to current acute and chronic problems.  Past Medical  History: Reviewed history from 02/06/2009 and no changes required. 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 HEENT: normal Neck: normal carotid upstrokes without bruits, JVP normal Lungs: CTA CV: RRR without murmur or gallop Abd: soft, NT, positive BS, no bruit, no organomegaly Ext: no clubbing, cyanosis, or edema. peripheral pulses 2+ and equal Skin: warm and dry without rash  EKG  Procedure date:  08/07/2009  Findings:      NSR, HR 61 bpm, within normal limits  Impression & Recommendations:  Problem # 1:  CAD, NATIVE VESSEL (ICD-414.01) Stable without angina. She remains on dual antiplatelet Rx and seems to be tolerating this well. Continue current meds without changes.  The following medications were removed from the medication list:    Tenormin 50 Mg Tabs (Atenolol) ..... One tablet every day Her updated medication list for this problem includes:    Aspirin 81 Mg Tbec (Aspirin) ..... One tablet every day    Plavix 75 Mg Tabs (Clopidogrel bisulfate) .Marland Kitchen...  Take one daily    Nitroglycerin 0.4 Mg Subl (Nitroglycerin) ..... One tablet under tongue every 5 minutes as needed for chest pain---may repeat times three  Orders: EKG w/ Interpretation (93000)  Problem # 2:  HYPERTENSION, BENIGN (ICD-401.1) BP seems controlled at present.  The following medications were removed from the medication list:    Tenormin 50 Mg Tabs (Atenolol) ..... One tablet every day Her updated medication list for this problem includes:    Aspirin 81 Mg Tbec (Aspirin) ..... One tablet every day    Furosemide 20 Mg Tabs (Furosemide) .Marland Kitchen... As needed    Losartan Potassium 50 Mg Tabs (Losartan potassium) .Marland Kitchen... Take one tablet by mouth daily  BP today: 130/78 Prior BP: 160/80 (02/06/2009)  Labs Reviewed: K+: 5.3 (09/13/2008) Creat: : 0.8 (09/13/2008)     Problem # 3:  DIASTOLIC HEART FAILURE, CHRONIC (ICD-428.32) No signs of volume excess on exam today. Continue Losartan and as needed lasix.  The following medications were removed from the medication list:    Tenormin 50 Mg Tabs (Atenolol) ..... One tablet every day Her updated medication list for this problem includes:    Aspirin 81 Mg Tbec (Aspirin) ..... One tablet every day    Plavix 75 Mg Tabs (Clopidogrel bisulfate) .Marland Kitchen... Take one daily    Nitroglycerin 0.4 Mg Subl (Nitroglycerin) ..... One tablet under tongue every 5 minutes as needed for chest pain---may repeat times three    Furosemide 20 Mg Tabs (Furosemide) .Marland Kitchen... As needed    Losartan Potassium 50 Mg Tabs (Losartan potassium) .Marland Kitchen... Take one tablet by mouth daily  Patient Instructions: 1)  Your physician recommends that you continue on your current medications as directed. Please refer to the Current Medication list given to you today. 2)  Your physician wants you to follow-up in:   6 MONTHS. You will receive a reminder letter in the mail two months in advance. If you don't receive a letter, please call our office to schedule the follow-up appointment.

## 2010-03-29 NOTE — Letter (Signed)
Summary: [facility]   Imported By: Sherian Rein 06/13/2009 10:49:49  _____________________________________________________________________  External Attachment:    Type:   Image     Comment:   External Document

## 2010-03-29 NOTE — Letter (Signed)
Summary: [facility] Instructions  [facility]  [address] [place], [place] [address]   Phone: [phone]  Fax: [phone]       [name]    06/08/1945    MRN: [id number]        Procedure Day /Date: Thursday 03/15/2010     Arrival Time: 8:00AM     Procedure Time: 9:00AM     Location of Procedure:                    x   [facility] (4th Floor)                       PREPARATION FOR COLONOSCOPY WITH MOVIPREP   Starting 5 days prior to your procedure 03/10/2010 do not eat nuts, seeds, popcorn, corn, beans, peas,  salads, or any raw vegetables.  Do not take any fiber supplements (e.g. Metamucil, Citrucel, and Benefiber).  THE DAY BEFORE YOUR PROCEDURE         DATE: 1/18     DAY: Wednesday  1.  Drink clear liquids the entire day-NO SOLID FOOD  2.  Do not drink anything colored red or purple.  Avoid juices with pulp.  No orange juice.  3.  Drink at least 64 oz. (8 glasses) of fluid/clear liquids during the day to prevent dehydration and help the prep work efficiently.  CLEAR LIQUIDS INCLUDE: Water Jello Ice Popsicles Tea (sugar ok, no milk/cream) Powdered fruit flavored drinks Coffee (sugar ok, no milk/cream) Gatorade Juice: apple, white grape, white cranberry  Lemonade Clear bullion, consomm, broth Carbonated beverages (any kind) Strained chicken noodle soup Hard Candy                             4.  In the morning, mix first dose of MoviPrep solution:    Empty 1 Pouch A and 1 Pouch B into the disposable container    Add lukewarm drinking water to the top line of the container. Mix to dissolve    Refrigerate (mixed solution should be used within 24 hrs)  5.  Begin drinking the prep at 5:00 p.m. The MoviPrep container is divided by 4 marks.   Every 15 minutes drink the solution down to the next mark (approximately 8 oz) until the full liter is complete.   6.  Follow completed prep with 16 oz of clear liquid of your choice (Nothing  red or purple).  Continue to drink clear liquids until bedtime.  7.  Before going to bed, mix second dose of MoviPrep solution:    Empty 1 Pouch A and 1 Pouch B into the disposable container    Add lukewarm drinking water to the top line of the container. Mix to dissolve    Refrigerate  THE DAY OF YOUR PROCEDURE      DATE: 1/19     DAY: Thursday  Beginning at 4:00AM (5 hours before procedure):         1. Every 15 minutes, drink the solution down to the next mark (approx 8 oz) until the full liter is complete.  2. Follow completed prep with 16 oz. of clear liquid of your choice.    3. You may drink clear liquids until 7:00AM (2 HOURS BEFORE PROCEDURE).   MEDICATION INSTRUCTIONS  Unless otherwise instructed, you should take regular prescription medications with a small sip of water   as early as possible the  morning of your procedure.  Diabetic patients - see separate instructions.           OTHER INSTRUCTIONS  You will need a responsible adult at least 67 years of age to accompany you and drive you home.   This person must remain in the waiting room during your procedure.  Wear loose fitting clothing that is easily removed.  Leave jewelry and other valuables at home.  However, you may wish to bring a book to read or  an iPod/MP3 player to listen to music as you wait for your procedure to start.  Remove all body piercing jewelry and leave at home.  Total time from sign-in until discharge is approximately 2-3 hours.  You should go home directly after your procedure and rest.  You can resume normal activities the  day after your procedure.  The day of your procedure you should not:   Drive   Make legal decisions   Operate machinery   Drink alcohol   Return to work  You will receive specific instructions about eating, activities and medications before you leave.    The above instructions have been reviewed and explained to me by   Karl Bales RN   March 01, 2010 10:11 AM    I fully understand and can verbalize these instructions _____________________________ Date _________

## 2010-03-29 NOTE — Letter (Signed)
Summary: Pre Visit Letter Revised  [facility]  [address] [place], [place] [address]   Phone: [phone]  Fax: [phone]        01/12/2010 MRN: [id number] [facility] [address] [place], [place]  [address]             Procedure Date:  03-15-10   Welcome to the Gastroenterology Division at [facility].    You are scheduled to see a nurse for your pre-procedure visit on 03-01-10 at 10:00a.m. on the 3rd floor at [facility], [address]  We ask that you try to arrive at our office 15 minutes prior to your appointment time to allow for check-in.  Please take a minute to review the attached form.  If you answer "Yes" to one or more of the questions on the first page, we ask that you call the person listed at your earliest opportunity.  If you answer "No" to all of the questions, please complete the rest of the form and bring it to your appointment.    Your nurse visit will consist of discussing your medical and surgical history, your immediate family medical history, and your medications.   If you are unable to list all of your medications on the form, please bring the medication bottles to your appointment and we will list them.  We will need to be aware of both prescribed and over the counter drugs.  We will need to know exact dosage information as well.    Please be prepared to read and sign documents such as consent forms, a financial agreement, and acknowledgement forms.  If necessary, and with your consent, a friend or relative is welcome to sit-in on the nurse visit with you.  Please bring your insurance card so that we may make a copy of it.  If your insurance requires a referral to see a specialist, please bring your referral form from your primary care physician.  No co-pay is required for this nurse visit.     If you cannot keep your appointment, please call [phone] to cancel or reschedule prior to your appointment date.  This allows [place] the  opportunity to schedule an appointment for another patient in need of care.    Thank you for choosing [facility] for your medical needs.  We appreciate the opportunity to care for you.  Please visit [place] at our website  to learn more about our practice.  Sincerely, The Gastroenterology Division

## 2010-03-29 NOTE — Letter (Signed)
Summary: [facility]  [facility]   Imported By: Lester Live Oak 05/05/2009 11:04:46  _____________________________________________________________________  External Attachment:    Type:   Image     Comment:   External Document

## 2010-04-30 ENCOUNTER — Encounter: Payer: Self-pay | Admitting: Physician Assistant

## 2010-05-03 ENCOUNTER — Ambulatory Visit (INDEPENDENT_AMBULATORY_CARE_PROVIDER_SITE_OTHER): Payer: Medicare Other | Admitting: Physician Assistant

## 2010-05-03 ENCOUNTER — Encounter: Payer: Self-pay | Admitting: Physician Assistant

## 2010-05-03 DIAGNOSIS — Z01810 Encounter for preprocedural cardiovascular examination: Secondary | ICD-10-CM

## 2010-05-03 DIAGNOSIS — I2510 Atherosclerotic heart disease of native coronary artery without angina pectoris: Secondary | ICD-10-CM

## 2010-05-08 NOTE — Assessment & Plan Note (Addendum)
Summary: rov. hernia repair. gd   Visit Type:  Follow-up Referring Provider:  Karie Soda, MD Primary Provider:  [name], MD  CC:  headache-occ.  History of Present Illness: Primary Cardiologist:  Dr. [name]  [name] is a 65 yo female with a h/o CAD, history of drug eluting stent placement to the LAD with cutting balloon and kissing balloon angioplasty to the diagonal in 2006, status post drug-eluting stent placement to the LAD in July 2010, preserved LV function, diastolic heart failure, diabetes, hypertension, hyperlipidemia and colon cancer status post resection and chemotherapy.  Her last Myoview study was in December 2010 and demonstrated no scar or ischemia and EF 76%.  She presents for surgical clearance.  She has had several abdominal surgeries for cancer.  She now has a ventral hernia.  She has been off of Plavix since her last surgery about a year ago.  She had no problems with her surgery.  She is only somewhat active.  She denies exertional chest discomfort.  She has chronic dyspnea with exertion.  She probably describes NYHA class IIb symptoms.  She denies orthopnea, PND.  She has chronic pedal edema without change.  She denies syncope or palpitations.  She is able to achieve 4 METS or greater without chest discomfort or shortness of breath.  Current Medications (verified): 1)  Aspirin 81 Mg  Tbec (Aspirin) .... One Tablet Every Day 2)  Lantus 100 Unit/ml  Soln (Insulin Glargine) .... 55 Units Once A Day 3)  Metformin Hcl 1000 Mg Tabs (Metformin Hcl) .... 2 By Mouth Two Times A Day 4)  Multivitamins  Tabs (Multiple Vitamin) .Marland Kitchen.. 1 By Mouth Once Daily 5)  Fish Oil   Oil (Fish Oil) .Marland Kitchen.. 1 By Mouth Once Daily 6)  Nitroglycerin 0.4 Mg Subl (Nitroglycerin) .... One Tablet Under Tongue Every 5 Minutes As Needed For Chest Pain---May Repeat Times Three 7)  Furosemide 20 Mg Tabs (Furosemide) .... As Needed 8)  Crestor 10 Mg Tabs (Rosuvastatin Calcium) .... One Tablet By  Mouth Once Daily 9)  Prozac 40 Mg Caps (Fluoxetine Hcl) .... Take 1 Capsule By Mouth Once A Day 10)  Atenolol 50 Mg Tabs (Atenolol) .... Once Daily  Allergies (verified): 1)  ! Morphine 2)  ! Codeine 3)  ! Penicillin 4)  ! 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 Daily Caffeine Use-1 No tobacco Occasional alcohol intake Sedentary lifestyle Lives alone in [place] - has very little social support Occupation: Retired-Disability  Review of Systems       As per  the HPI.  All other systems reviewed and negative.   Vital Signs:  Patient profile:   65 year old female Height:      65 inches Weight:      258 pounds BMI:     43.09 Pulse rate:   68 / minute BP sitting:   145 / 55  (left arm) Cuff size:   large  Vitals Entered By: Caralee Ates CMA (May 03, 2010 3:42 PM)  Physical Exam  General:  Well nourished, well developed, in no acute distress HEENT: normal Neck: no JVD Cardiac:  normal S1, S2; RRR; no murmur Lungs:  clear to auscultation bilaterally, no wheezing, rhonchi or rales Abd: soft, nontender, no hepatomegaly Ext: 1+ bilateral edema Vascular: no carotid  bruits Skin: warm and dry Neuro:  CNs 2-12 intact, no focal abnormalities noted  Endo: No thyromegaly Psych: Normal affect   EKG  Procedure date:  05/03/2010  Findings:      normal sinus  rhythm Heart rate 66 Normal axis Nonspecific ST-T wave changes No significant change when compared to prior tracing  Impression & Recommendations:  Problem # 1:  CAD, NATIVE VESSEL (ICD-414.01) She is doing well without anginal symptoms.  She has actually been off of her Plavix for the last year or more.  She's had no problems with this.  She is able to achieve 4 METS without angina.  Her EKG is unchanged.  She had a functional study a little over one year ago that demonstrated no ischemia.  According to ACC/AHA guidelines, she requires no further cardiovascular workup prior to her noncardiac surgery.  If aspirin has to be discontinued for her surgery, this should be reinitiated postoperatively as soon as felt to be safe.  She should remain on her beta blocker throughout the perioperative period and our service is certainly available in the perioperative period as necessary.  Problem # 2:  PREOPERATIVE EXAMINATION (ICD-V72.84) As above.  Problem # 3:  DIASTOLIC HEART FAILURE, CHRONIC (ICD-428.32) Her volume is stable.  Close attention should be paid to her volume status throughout the perioperative period.  Problem # 4:  HYPERTENSION, BENIGN (ICD-401.1) Mildly elevated.  Continue to monitor.  Patient Instructions: 1)  Your physician recommends that you schedule a follow-up appointment in: 3 months with Dr. Excell Seltzer.  2)  Your physician recommends that you continue on your current medications as directed. Please refer to the Current Medication list given to you today.

## 2010-05-08 NOTE — Letter (Signed)
Summary: [facility] - Office Visit  [facility] - Office Visit   Imported By: Marylou Mccoy 05/03/2010 14:32:31  _____________________________________________________________________  External Attachment:    Type:   Image     Comment:   External Document

## 2010-05-13 LAB — CROSSMATCH: Antibody Screen: NEGATIVE

## 2010-05-13 LAB — GLUCOSE, CAPILLARY
Glucose-Capillary: 104 mg/dL — ABNORMAL HIGH (ref 70–99)
Glucose-Capillary: 105 mg/dL — ABNORMAL HIGH (ref 70–99)
Glucose-Capillary: 111 mg/dL — ABNORMAL HIGH (ref 70–99)
Glucose-Capillary: 112 mg/dL — ABNORMAL HIGH (ref 70–99)
Glucose-Capillary: 112 mg/dL — ABNORMAL HIGH (ref 70–99)
Glucose-Capillary: 114 mg/dL — ABNORMAL HIGH (ref 70–99)
Glucose-Capillary: 125 mg/dL — ABNORMAL HIGH (ref 70–99)
Glucose-Capillary: 127 mg/dL — ABNORMAL HIGH (ref 70–99)
Glucose-Capillary: 128 mg/dL — ABNORMAL HIGH (ref 70–99)
Glucose-Capillary: 131 mg/dL — ABNORMAL HIGH (ref 70–99)
Glucose-Capillary: 138 mg/dL — ABNORMAL HIGH (ref 70–99)
Glucose-Capillary: 139 mg/dL — ABNORMAL HIGH (ref 70–99)
Glucose-Capillary: 146 mg/dL — ABNORMAL HIGH (ref 70–99)
Glucose-Capillary: 149 mg/dL — ABNORMAL HIGH (ref 70–99)
Glucose-Capillary: 150 mg/dL — ABNORMAL HIGH (ref 70–99)
Glucose-Capillary: 163 mg/dL — ABNORMAL HIGH (ref 70–99)
Glucose-Capillary: 169 mg/dL — ABNORMAL HIGH (ref 70–99)
Glucose-Capillary: 176 mg/dL — ABNORMAL HIGH (ref 70–99)
Glucose-Capillary: 181 mg/dL — ABNORMAL HIGH (ref 70–99)
Glucose-Capillary: 192 mg/dL — ABNORMAL HIGH (ref 70–99)
Glucose-Capillary: 197 mg/dL — ABNORMAL HIGH (ref 70–99)
Glucose-Capillary: 214 mg/dL — ABNORMAL HIGH (ref 70–99)
Glucose-Capillary: 217 mg/dL — ABNORMAL HIGH (ref 70–99)
Glucose-Capillary: 219 mg/dL — ABNORMAL HIGH (ref 70–99)
Glucose-Capillary: 269 mg/dL — ABNORMAL HIGH (ref 70–99)
Glucose-Capillary: 279 mg/dL — ABNORMAL HIGH (ref 70–99)
Glucose-Capillary: 439 mg/dL — ABNORMAL HIGH (ref 70–99)
Glucose-Capillary: 468 mg/dL — ABNORMAL HIGH (ref 70–99)
Glucose-Capillary: 84 mg/dL (ref 70–99)

## 2010-05-13 LAB — DIFFERENTIAL
Basophils Absolute: 0 10*3/uL (ref 0.0–0.1)
Eosinophils Relative: 2 % (ref 0–5)
Lymphocytes Relative: 8 % — ABNORMAL LOW (ref 12–46)
Neutro Abs: 9.1 10*3/uL — ABNORMAL HIGH (ref 1.7–7.7)
Neutrophils Relative %: 87 % — ABNORMAL HIGH (ref 43–77)

## 2010-05-13 LAB — COMPREHENSIVE METABOLIC PANEL
ALT: 26 U/L (ref 0–35)
ALT: 28 U/L (ref 0–35)
AST: 32 U/L (ref 0–37)
Alkaline Phosphatase: 106 U/L (ref 39–117)
BUN: 17 mg/dL (ref 6–23)
CO2: 26 mEq/L (ref 19–32)
CO2: 27 mEq/L (ref 19–32)
Calcium: 8.2 mg/dL — ABNORMAL LOW (ref 8.4–10.5)
Calcium: 9.9 mg/dL (ref 8.4–10.5)
Creatinine, Ser: 1.05 mg/dL (ref 0.4–1.2)
GFR calc Af Amer: >60 mL/min (ref >60)
GFR calc non Af Amer: 50 mL/min — ABNORMAL LOW (ref >60)
GFR calc non Af Amer: 52 mL/min — ABNORMAL LOW (ref >60)
Glucose, Bld: 133 mg/dL — ABNORMAL HIGH (ref 70–99)
Glucose, Bld: 323 mg/dL — ABNORMAL HIGH (ref 70–99)
Potassium: 4.4 mEq/L (ref 3.5–5.1)
Sodium: 139 mEq/L (ref 135–145)
Total Protein: 4.4 g/dL — ABNORMAL LOW (ref 6.0–8.3)
Total Protein: 6.4 g/dL (ref 6.0–8.3)

## 2010-05-13 LAB — CBC
HCT: 28.1 % — ABNORMAL LOW (ref 36.0–46.0)
HCT: 28.6 % — ABNORMAL LOW (ref 36.0–46.0)
HCT: 30.9 % — ABNORMAL LOW (ref 36.0–46.0)
Hemoglobin: 10.7 g/dL — ABNORMAL LOW (ref 12.0–15.0)
Hemoglobin: 8.1 g/dL — ABNORMAL LOW (ref 12.0–15.0)
Hemoglobin: 9.5 g/dL — ABNORMAL LOW (ref 12.0–15.0)
Hemoglobin: 9.6 g/dL — ABNORMAL LOW (ref 12.0–15.0)
MCHC: 33 g/dL (ref 30.0–36.0)
MCHC: 33.5 g/dL (ref 30.0–36.0)
MCHC: 33.6 g/dL (ref 30.0–36.0)
MCHC: 33.9 g/dL (ref 30.0–36.0)
MCHC: 33.9 g/dL (ref 30.0–36.0)
MCHC: 34 g/dL (ref 30.0–36.0)
MCV: 86.1 fL (ref 78.0–100.0)
MCV: 86.1 fL (ref 78.0–100.0)
MCV: 86.6 fL (ref 78.0–100.0)
MCV: 88.1 fL (ref 78.0–100.0)
Platelets: 141 10*3/uL — ABNORMAL LOW (ref 150–400)
Platelets: 146 10*3/uL — ABNORMAL LOW (ref 150–400)
Platelets: 344 10*3/uL (ref 150–400)
RBC: 2.45 MIL/uL — ABNORMAL LOW (ref 3.87–5.11)
RBC: 2.77 MIL/uL — ABNORMAL LOW (ref 3.87–5.11)
RBC: 3.19 MIL/uL — ABNORMAL LOW (ref 3.87–5.11)
RBC: 3.26 MIL/uL — ABNORMAL LOW (ref 3.87–5.11)
RBC: 3.87 MIL/uL (ref 3.87–5.11)
RDW: 14.3 % (ref 11.5–15.5)
RDW: 14.9 % (ref 11.5–15.5)
RDW: 15 % (ref 11.5–15.5)
RDW: 15.2 % (ref 11.5–15.5)
WBC: 10.4 10*3/uL (ref 4.0–10.5)
WBC: 12.1 10*3/uL — ABNORMAL HIGH (ref 4.0–10.5)
WBC: 7.9 10*3/uL (ref 4.0–10.5)

## 2010-05-13 LAB — SAMPLE TO BLOOD BANK

## 2010-05-13 LAB — CREATININE, SERUM
Creatinine, Ser: 0.94 mg/dL (ref 0.4–1.2)
Creatinine, Ser: 1.42 mg/dL — ABNORMAL HIGH (ref 0.4–1.2)
Creatinine, Ser: 2.12 mg/dL — ABNORMAL HIGH (ref 0.4–1.2)
GFR calc Af Amer: 28 mL/min — ABNORMAL LOW (ref >60)
GFR calc Af Amer: 45 mL/min — ABNORMAL LOW (ref >60)
GFR calc non Af Amer: 60 mL/min — ABNORMAL LOW (ref >60)

## 2010-05-13 LAB — PREPARE PLATELETS

## 2010-05-13 LAB — BASIC METABOLIC PANEL
BUN: 18 mg/dL (ref 6–23)
BUN: 18 mg/dL (ref 6–23)
Calcium: 8.2 mg/dL — ABNORMAL LOW (ref 8.4–10.5)
GFR calc non Af Amer: 30 mL/min — ABNORMAL LOW (ref >60)
GFR calc non Af Amer: >60 mL/min (ref >60)
Glucose, Bld: 124 mg/dL — ABNORMAL HIGH (ref 70–99)
Glucose, Bld: 297 mg/dL — ABNORMAL HIGH (ref 70–99)
Potassium: 4.7 mEq/L (ref 3.5–5.1)

## 2010-05-13 LAB — HEMOGLOBIN AND HEMATOCRIT, BLOOD
HCT: 23.5 % — ABNORMAL LOW (ref 36.0–46.0)
HCT: 32.9 % — ABNORMAL LOW (ref 36.0–46.0)
Hemoglobin: 10.8 g/dL — ABNORMAL LOW (ref 12.0–15.0)
Hemoglobin: 7.7 g/dL — ABNORMAL LOW (ref 12.0–15.0)

## 2010-05-13 LAB — PREALBUMIN: Prealbumin: 5.7 mg/dL — ABNORMAL LOW (ref 18.0–45.0)

## 2010-05-14 LAB — URINALYSIS, ROUTINE W REFLEX MICROSCOPIC
Glucose, UA: NEGATIVE mg/dL
Ketones, ur: 15 mg/dL — AB
Protein, ur: NEGATIVE mg/dL

## 2010-05-14 LAB — COMPREHENSIVE METABOLIC PANEL
Albumin: 2.8 g/dL — ABNORMAL LOW (ref 3.5–5.2)
BUN: 12 mg/dL (ref 6–23)
Creatinine, Ser: 0.85 mg/dL (ref 0.4–1.2)
Potassium: 3.5 mEq/L (ref 3.5–5.1)
Total Protein: 5.9 g/dL — ABNORMAL LOW (ref 6.0–8.3)

## 2010-05-14 LAB — DIFFERENTIAL
Lymphocytes Relative: 8 % — ABNORMAL LOW (ref 12–46)
Lymphs Abs: 0.7 10*3/uL (ref 0.7–4.0)
Monocytes Absolute: 0.3 10*3/uL (ref 0.1–1.0)
Monocytes Relative: 3 % (ref 3–12)
Neutro Abs: 7.8 10*3/uL — ABNORMAL HIGH (ref 1.7–7.7)

## 2010-05-14 LAB — CBC
HCT: 36.7 % (ref 36.0–46.0)
MCV: 87.9 fL (ref 78.0–100.0)
Platelets: 383 10*3/uL (ref 150–400)
RDW: 15.4 % (ref 11.5–15.5)

## 2010-05-16 LAB — CBC
HCT: 24.4 % — ABNORMAL LOW (ref 36.0–46.0)
Hemoglobin: 8.2 g/dL — ABNORMAL LOW (ref 12.0–15.0)
Hemoglobin: 8.4 g/dL — ABNORMAL LOW (ref 12.0–15.0)
Hemoglobin: 9.3 g/dL — ABNORMAL LOW (ref 12.0–15.0)
MCHC: 33.2 g/dL (ref 30.0–36.0)
MCHC: 33.7 g/dL (ref 30.0–36.0)
MCHC: 34.2 g/dL (ref 30.0–36.0)
MCV: 87.9 fL (ref 78.0–100.0)
MCV: 88.2 fL (ref 78.0–100.0)
MCV: 88.6 fL (ref 78.0–100.0)
RBC: 2.79 MIL/uL — ABNORMAL LOW (ref 3.87–5.11)
RBC: 3.15 MIL/uL — ABNORMAL LOW (ref 3.87–5.11)
RDW: 15.6 % — ABNORMAL HIGH (ref 11.5–15.5)

## 2010-05-16 LAB — GLUCOSE, CAPILLARY
Glucose-Capillary: 134 mg/dL — ABNORMAL HIGH (ref 70–99)
Glucose-Capillary: 187 mg/dL — ABNORMAL HIGH (ref 70–99)
Glucose-Capillary: 193 mg/dL — ABNORMAL HIGH (ref 70–99)
Glucose-Capillary: 204 mg/dL — ABNORMAL HIGH (ref 70–99)

## 2010-05-20 LAB — CBC
HCT: 37.2 % (ref 36.0–46.0)
Platelets: 212 10*3/uL (ref 150–400)
WBC: 5.5 10*3/uL (ref 4.0–10.5)

## 2010-05-30 LAB — GLUCOSE, CAPILLARY
Glucose-Capillary: 118 mg/dL — ABNORMAL HIGH (ref 70–99)
Glucose-Capillary: 133 mg/dL — ABNORMAL HIGH (ref 70–99)

## 2010-05-31 ENCOUNTER — Emergency Department (HOSPITAL_COMMUNITY)
Admission: EM | Admit: 2010-05-31 | Discharge: 2010-05-31 | Disposition: A | Discharge status: Home / Self Care | Payer: Medicare Other | Attending: Emergency Medicine | Admitting: Emergency Medicine

## 2010-05-31 ENCOUNTER — Emergency Department (HOSPITAL_COMMUNITY): Payer: Medicare Other

## 2010-05-31 DIAGNOSIS — M545 Low back pain, unspecified: Secondary | ICD-10-CM | POA: Insufficient documentation

## 2010-05-31 DIAGNOSIS — M79609 Pain in unspecified limb: Secondary | ICD-10-CM | POA: Insufficient documentation

## 2010-05-31 DIAGNOSIS — E119 Type 2 diabetes mellitus without complications: Secondary | ICD-10-CM | POA: Insufficient documentation

## 2010-05-31 DIAGNOSIS — I10 Essential (primary) hypertension: Secondary | ICD-10-CM | POA: Insufficient documentation

## 2010-05-31 DIAGNOSIS — F411 Generalized anxiety disorder: Secondary | ICD-10-CM | POA: Insufficient documentation

## 2010-05-31 DIAGNOSIS — Z79899 Other long term (current) drug therapy: Secondary | ICD-10-CM | POA: Insufficient documentation

## 2010-05-31 DIAGNOSIS — Z794 Long term (current) use of insulin: Secondary | ICD-10-CM | POA: Insufficient documentation

## 2010-05-31 LAB — BASIC METABOLIC PANEL
GFR calc Af Amer: >60 mL/min (ref >60)
GFR calc non Af Amer: 55 mL/min — ABNORMAL LOW (ref >60)
Potassium: 4.5 mEq/L (ref 3.5–5.1)
Sodium: 136 mEq/L (ref 135–145)

## 2010-05-31 LAB — CBC
HCT: 39.6 % (ref 36.0–46.0)
Platelets: 203 10*3/uL (ref 150–400)
RDW: 13.6 % (ref 11.5–15.5)
WBC: 8.3 10*3/uL (ref 4.0–10.5)

## 2010-05-31 LAB — DIFFERENTIAL
Basophils Absolute: 0 10*3/uL (ref 0.0–0.1)
Basophils Relative: 1 % (ref 0–1)
Eosinophils Absolute: 0 10*3/uL (ref 0.0–0.7)
Eosinophils Relative: 1 % (ref 0–5)
Lymphocytes Relative: 12 % (ref 12–46)

## 2010-06-03 LAB — CBC
MCHC: 35 g/dL (ref 30.0–36.0)
RBC: 3.87 MIL/uL (ref 3.87–5.11)
WBC: 4.8 10*3/uL (ref 4.0–10.5)

## 2010-06-03 LAB — BASIC METABOLIC PANEL
Calcium: 10.2 mg/dL (ref 8.4–10.5)
Calcium: 9.9 mg/dL (ref 8.4–10.5)
Creatinine, Ser: 0.82 mg/dL (ref 0.4–1.2)
GFR calc Af Amer: >60 mL/min (ref >60)
GFR calc Af Amer: >60 mL/min (ref >60)
GFR calc non Af Amer: >60 mL/min (ref >60)
GFR calc non Af Amer: >60 mL/min (ref >60)
Potassium: 4.6 mEq/L (ref 3.5–5.1)
Sodium: 139 mEq/L (ref 135–145)

## 2010-06-03 LAB — GLUCOSE, CAPILLARY
Glucose-Capillary: 124 mg/dL — ABNORMAL HIGH (ref 70–99)
Glucose-Capillary: 145 mg/dL — ABNORMAL HIGH (ref 70–99)
Glucose-Capillary: 99 mg/dL (ref 70–99)

## 2010-06-03 LAB — POCT I-STAT 3, VENOUS BLOOD GAS (G3P V)
Bicarbonate: 25.7 mEq/L — ABNORMAL HIGH (ref 20.0–24.0)
pH, Ven: 7.357 — ABNORMAL HIGH (ref 7.250–7.300)
pO2, Ven: 43 mmHg (ref 30.0–45.0)

## 2010-06-06 ENCOUNTER — Telehealth: Payer: Self-pay | Admitting: Physician Assistant

## 2010-06-06 NOTE — Telephone Encounter (Signed)
All Cardiac faxed to [facility] @ [phone] 06/06/10/KM

## 2010-06-07 ENCOUNTER — Other Ambulatory Visit (HOSPITAL_COMMUNITY): Payer: Self-pay | Admitting: Internal Medicine

## 2010-06-07 DIAGNOSIS — M5416 Radiculopathy, lumbar region: Secondary | ICD-10-CM

## 2010-06-11 ENCOUNTER — Other Ambulatory Visit: Payer: Self-pay | Admitting: Surgery

## 2010-06-11 ENCOUNTER — Ambulatory Visit (HOSPITAL_COMMUNITY)
Admission: RE | Admit: 2010-06-11 | Discharge: 2010-06-11 | Disposition: A | Discharge status: Home / Self Care | Payer: Medicare Other | Source: Ambulatory Visit | Attending: Internal Medicine | Admitting: Internal Medicine

## 2010-06-11 ENCOUNTER — Encounter (HOSPITAL_COMMUNITY): Payer: Medicare Other | Attending: Surgery

## 2010-06-11 DIAGNOSIS — M5416 Radiculopathy, lumbar region: Secondary | ICD-10-CM

## 2010-06-11 DIAGNOSIS — Z01812 Encounter for preprocedural laboratory examination: Secondary | ICD-10-CM | POA: Insufficient documentation

## 2010-06-11 DIAGNOSIS — M5126 Other intervertebral disc displacement, lumbar region: Secondary | ICD-10-CM | POA: Insufficient documentation

## 2010-06-11 DIAGNOSIS — M79609 Pain in unspecified limb: Secondary | ICD-10-CM | POA: Insufficient documentation

## 2010-06-11 DIAGNOSIS — M545 Low back pain, unspecified: Secondary | ICD-10-CM | POA: Insufficient documentation

## 2010-06-11 DIAGNOSIS — Z01818 Encounter for other preprocedural examination: Secondary | ICD-10-CM | POA: Insufficient documentation

## 2010-06-11 LAB — CBC
MCH: 30.5 pg (ref 26.0–34.0)
Platelets: 184 10*3/uL (ref 150–400)
RBC: 4.4 MIL/uL (ref 3.87–5.11)
WBC: 5.8 10*3/uL (ref 4.0–10.5)

## 2010-06-11 LAB — BASIC METABOLIC PANEL
Chloride: 101 mEq/L (ref 96–112)
Creatinine, Ser: 0.8 mg/dL (ref 0.4–1.2)
GFR calc Af Amer: >60 mL/min (ref >60)

## 2010-06-11 LAB — SURGICAL PCR SCREEN
MRSA, PCR: NEGATIVE
Staphylococcus aureus: POSITIVE — AB

## 2010-06-19 ENCOUNTER — Inpatient Hospital Stay (HOSPITAL_COMMUNITY): Payer: Medicare Other

## 2010-06-19 ENCOUNTER — Inpatient Hospital Stay (HOSPITAL_COMMUNITY)
Admission: RE | Admit: 2010-06-19 | Discharge: 2010-06-20 | DRG: 491 | Disposition: A | Discharge status: Home / Self Care | Payer: Medicare Other | Source: Ambulatory Visit | Attending: Neurosurgery | Admitting: Neurosurgery

## 2010-06-19 ENCOUNTER — Telehealth: Payer: Self-pay | Admitting: Cardiovascular Disease

## 2010-06-19 DIAGNOSIS — Z79899 Other long term (current) drug therapy: Secondary | ICD-10-CM

## 2010-06-19 DIAGNOSIS — Z7982 Long term (current) use of aspirin: Secondary | ICD-10-CM

## 2010-06-19 DIAGNOSIS — M47817 Spondylosis without myelopathy or radiculopathy, lumbosacral region: Secondary | ICD-10-CM | POA: Diagnosis present

## 2010-06-19 DIAGNOSIS — M5126 Other intervertebral disc displacement, lumbar region: Principal | ICD-10-CM | POA: Diagnosis present

## 2010-06-19 DIAGNOSIS — Z01812 Encounter for preprocedural laboratory examination: Secondary | ICD-10-CM

## 2010-06-19 DIAGNOSIS — E669 Obesity, unspecified: Secondary | ICD-10-CM | POA: Diagnosis present

## 2010-06-19 DIAGNOSIS — I10 Essential (primary) hypertension: Secondary | ICD-10-CM | POA: Diagnosis present

## 2010-06-19 DIAGNOSIS — Z794 Long term (current) use of insulin: Secondary | ICD-10-CM

## 2010-06-19 DIAGNOSIS — E119 Type 2 diabetes mellitus without complications: Secondary | ICD-10-CM | POA: Diagnosis present

## 2010-06-19 LAB — GLUCOSE, CAPILLARY
Glucose-Capillary: 152 mg/dL — ABNORMAL HIGH (ref 70–99)
Glucose-Capillary: 151 mg/dL — ABNORMAL HIGH (ref 70–99)

## 2010-06-19 LAB — CBC
MCH: 31 pg (ref 26.0–34.0)
MCHC: 35.1 g/dL (ref 30.0–36.0)
Platelets: 231 10*3/uL (ref 150–400)
RBC: 4.51 MIL/uL (ref 3.87–5.11)
RDW: 14 % (ref 11.5–15.5)

## 2010-06-19 LAB — URINALYSIS, ROUTINE W REFLEX MICROSCOPIC
Bilirubin Urine: NEGATIVE
Glucose, UA: NEGATIVE mg/dL
Hgb urine dipstick: NEGATIVE
Ketones, ur: NEGATIVE mg/dL
Protein, ur: NEGATIVE mg/dL

## 2010-06-19 LAB — PROTIME-INR: Prothrombin Time: 12.9 seconds (ref 11.6–15.2)

## 2010-06-19 LAB — URINE MICROSCOPIC-ADD ON

## 2010-06-19 LAB — BASIC METABOLIC PANEL
Chloride: 104 mEq/L (ref 96–112)
GFR calc Af Amer: >60 mL/min (ref >60)
Potassium: 5.1 mEq/L (ref 3.5–5.1)

## 2010-06-19 NOTE — Telephone Encounter (Signed)
12,Stress faxed to [facility] @ [phone] 06/18/10/KM

## 2010-06-20 LAB — GLUCOSE, CAPILLARY

## 2010-06-21 ENCOUNTER — Ambulatory Visit (HOSPITAL_COMMUNITY): Admission: RE | Admit: 2010-06-21 | Payer: Medicare Other | Source: Ambulatory Visit | Admitting: Surgery

## 2010-06-22 LAB — GLUCOSE, CAPILLARY: Glucose-Capillary: 158 mg/dL — ABNORMAL HIGH (ref 70–99)

## 2010-06-25 NOTE — Op Note (Signed)
[name], [name]                ACCOUNT NO.:  [id number]  MEDICAL RECORD NO.:  [id number]           PATIENT TYPE:  I  LOCATION:  3524                         FACILITY:  [facility]  PHYSICIAN:  Clydene Fake, M.D.  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  PROCEDURES:  Decompressive laminectomy, decompression of right L3, L4, L5 roots (three levels), microdissection with microscope, and diskectomy.  SURGEON:  Clydene Fake, MD  ASSISTANT:  Hewitt Shorts, MD  ANESTHESIA:  General endotracheal tube anesthesia.  ESTIMATED BLOOD LOSS:  Minimal.  BLOOD GIVEN:  None.  DRAINS:  None.  COMPLICATIONS:  None.  REASON FOR PROCEDURE:  The patient is a 65 year old woman who was had back and right leg pain, numbness, and weakness with right dorsiflexion and EHL at 4/5 strength, decreased sensation in the right L5 distribution.  MRI was done showing laterally severe stenosis at the L3- 4 and L4-5 level and on top of that a disk herniation with extruded fragments below the disk space at 3-4 on the right.  The patient was brought in for decompression of lumbar spine.  PROCEDURE IN DETAIL:  The patient was brought to the operating room, general anesthesia was induced.  The patient was placed in prone position on Wilson frame with all pressure points padded.  The patient was prepped and draped in sterile fashion.  Site of incision was injected with 20 mL of 1% lidocaine with epinephrine.  Incision was made in the midline.  Lower lumbar spine incision taken down the fascia. Hemostasis obtained with Bovie cauterization.  The fascia was incised. On the  right side, subperiosteal dissection was done over spinous process of lamina up to the facets and we could see 2 interspaces. Markers were placed at both interspaces and x-rays were obtained showing that the markers were at the L4-5 and L5-S1 space.  We dissected up one more space and dissected the spinous process of the lamina up to the lateral facet at L3-4 and L4-5, and placed self-retaining retractor, so we could see the L3-4 level and L4-5 level, placed markers at these 2 interspaces.  Took another x-ray, this did confirm our positioning. Microscope was brought in for microdissection.  At this point, a high- speed drill was used to start a decompressive semi-hemilaminectomy removing the bottom part of the L3 lamina, removed facet from the top of the L4 lamina, and also at L4-5 level, and the bottom part of the L4 lamina medial facet and top of the L5 lamina.  This was then completed with Kerrison punches decompressing the central canal.  Hypertrophic ligaments and all gutters was also removed, also decompressing the canal and the nerve roots.  We made sure that the foramen with the L3 root was well decompressed and explored the disk space at L3-4 and then found to have a large extruded  fragment caudal to the disk space up under the dura at the 4 root.  Using various nerve hooks, we were able to mobilize the herniated disk and then removed the disk herniation.  Attention also at L4-5 level, removed hypertrophic ligaments decompressing the canal of the L4 root, explored the disk space, and found disk bulge at the L4-5 level that was left intact.  I made sure a foraminotomy was done over the L5 root making sure we removed all the hypertrophic ligaments posteriorly and laterally.  When we were finished, we had good decompression of the central canal in the L3, L4, and L5 roots and there were no further fragments of disk.  Even at L3-4 disk, we did not enter the disk space, just removed  the free fragments.  We irrigated it with antibiotic solution, good hemostasis with Gelfoam and thrombin.  A small piece of Gelfoam was left in the defect where the disk herniation was for hemostasis.  We had good decompression of the nerve roots. Retractors were removed.  We had good hemostasis.  The fascia was closed with 0 Vicryl interrupted sutures.  Subcutaneous tissue closed with 2-0 and 3-0 Vicryl interrupted sutures.  Skin closed with benzoin and Steri- Strips, dressing was placed.  The patient was placed back in the supine position, awoken from anesthesia, and transferred to the recovery room in stable condition.          ______________________________ Clydene Fake, M.D.     JRH/MEDQ  D:  06/19/2010  T:  06/20/2010  Job:  [id number]  Electronically Signed by Colon Branch M.D. on 06/25/2010 08:22:05 AM
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
## 2010-07-10 NOTE — Consult Note (Signed)
[name], [name]                ACCOUNT NO.:  [id number]   MEDICAL RECORD NO.:  [id number]          PATIENT TYPE:   LOCATION:                                 FACILITY:   PHYSICIAN:  Barbette Hair. Arlyce Dice, MD,FACG   DATE OF BIRTH:   DATE OF CONSULTATION:  DATE OF DISCHARGE:                                 CONSULTATION   REASON FOR CONSULTATION:  Anemia.   HISTORY OF PRESENT ILLNESS:  [name] is a 65 year old white female  admitted with shortness of breath and weakness.  She has a history of  coronary artery disease, diabetes, hypertension, and fibromyalgias.  She  was noted to have a microcytic anemia.  In the emergency room,  hemoglobin was 8.6.  It apparently was over 12, six months ago.  Heme-  occult positive stools were noted.  [name] does take Motrin at least  once a week, if not more frequently.  She does complain of dysphagia to  solids and frequent pyrosis.  She underwent a colonoscopy approximately  6 months ago that apparently was negative.  This was preformed at [facility].  She denies overt rectal bleeding, melena, or abdominal  pain.  She has been constipated for several months.  With straining of  her bowel movement, she may see minimal amounts on the toilet tissue.  She takes aspirin daily.  There is on prior history of ulcer disease.   PAST MEDICAL HISTORY:  Pertinent for coronary artery disease, diabetes,  and hypertension.  She is status post right BKA.  She is status post  multiple ventral hernia repair.   MEDICATIONS:  Insulin, atenolol, HCTZ, Cymbalta, Plavix, Lipitor, and  Janumet.   ALLERGIES:  She is allergic to PENICILLIN, CODEINE, and MORPHINE.   PHYSICAL EXAMINATION:  GENERAL:  She is an alert female in acute  distress.  VITAL SIGNS:  Stable.  She is anicteric.  HEENT:  Within normal limits.  CHEST:  Clear.  There is 1/6 early systolic murmur at the left sternal  border.  ABDOMEN:  Without mass, tenderness, or organomegaly.  EXTREMITIES:  There is no peripheral cyanosis, clubbing, or edema.  RECTAL:  Deferred.   LABORATORY DATA:  Pertinent for hemoglobin is 8.6, MCV of 69, and  platelet count 371,000.   IMPRESSION:  1. Iron deficiency anemia secondary to chronic gastrointestinal bleed      loss.  I suspect this may be NSAID - related are causing her active      peptic disease or ulceration or erosions anywhere along the GI      track.  AVMs are less likely.  It is unlikely that she has a      colonic neoplasm in view of her recent colonoscopy.  2. Dysphagia.  There is rule out esophageal stricture, be it peptic or      malignant.  3. Coronary artery disease.  4. Hypertension.  5. Diabetes.   RECOMMENDATIONS:  1. Hold Plavix.  2. Protonix 40 mg a day.  3. Upper endoscopy with possible dilation.  4. To consider small bowel enteroscopy and/or capsule endoscopy  pending results of the above.      Barbette Hair. Arlyce Dice, MD,FACG  Electronically Signed     RDK/MEDQ  D:  08/05/2007  T:  08/06/2007  Job:  [id number]

## 2010-07-10 NOTE — Discharge Summary (Signed)
[name], [name]                ACCOUNT NO.:  [id number]   MEDICAL RECORD NO.:  [id number]          PATIENT TYPE:  INP   LOCATION:  3737                         FACILITY:  [facility]   PHYSICIAN:  [name], MDDATE OF BIRTH:  04-21-1945   DATE OF ADMISSION:  08/05/2007  DATE OF DISCHARGE:  08/08/2007                               DISCHARGE SUMMARY   ADDENDUM   In addition to the previously listed medications, she will be on Nu-Iron  150 mg p.o. b.i.d.      Theodore Demark, PA-C      Laura Buckles. Bensimhon, MD  Electronically Signed    RB/MEDQ  D:  08/08/2007  T:  08/08/2007  Job:  [id number]
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
## 2010-07-10 NOTE — Op Note (Signed)
[name], [name]                ACCOUNT NO.:  [id number]   MEDICAL RECORD NO.:  [id number]          PATIENT TYPE:  INP   LOCATION:  3737                         FACILITY:  [facility]   PHYSICIAN:  Iva Boop, MD,FACGDATE OF BIRTH:  02-27-1945   DATE OF PROCEDURE:  08/07/2007  DATE OF DISCHARGE:  08/08/2007                               OPERATIVE REPORT   PROCEDURE REPORT:  Capsule endoscopy of the small intestine.   REASON FOR REFERRAL/INDICATIONS:  Occult blood loss anemia with  unrevealing EGD this hospitalization and recent outpatient colonoscopy,  unrevealing.   PROCEDURE DATA:  Small bowel passage time 4 hours 24 minutes.  The  capsule views started in the stomach, so gastric passage time is not  calculated.   FINDINGS:  1. Completed study.  2. Fair prep with some retained small-bowel material obscuring      portions of the mucosa.  3. Possible ulcer versus adherent debris (adherent debris is favored)      in the mid small bowel.  4. Otherwise, unremarkable.   SUMMARY AND RECOMMENDATIONS:  1. Possible ulcer in mid small bowel, though I suspect this is      adherent debris.  2. Otherwise, no cause of anemia seen.  3. If there are persistent anemia or blood loss issues, we would      consider repeat capsule study.      Iva Boop, MD,FACG  Electronically Signed     CEG/MEDQ  D:  08/11/2007  T:  08/12/2007  Job:  [id number]

## 2010-07-10 NOTE — H&P (Signed)
[name], [name]                ACCOUNT NO.:  [id number]   MEDICAL RECORD NO.:  [id number]          PATIENT TYPE:  INP   LOCATION:  3737                         FACILITY:  [facility]   PHYSICIAN:  Noralyn Pick. Eden Emms, MD, FACCDATE OF BIRTH:  07/18/1945   DATE OF ADMISSION:  08/05/2007  DATE OF DISCHARGE:                              HISTORY & PHYSICAL   PRIMARY CARDIOLOGIST:  Learta Codding, MD,FACC   PRIMARY CARE PHYSICIAN:  Catalina Pizza, M.D.   Ms. Tamas is a very pleasant 65 year old Caucasian female who resides in  [place],  [place], is followed by Dr. Andee Lineman.  She has a known  history of coronary artery disease status post previous cardiac  catheterization x2 in 2006, initially had a PCI with drug-eluting stent  to the LAD and then months later returned for a kissing balloon  angioplasty to the diagonal.  She had residual disease, 70%, to the RCA.  She states she has done well since that time, has not really experienced  any episodes of chest discomfort, has had problems with indigestion,  states she underwent a barium swallow about 3 weeks ago at [facility], was told she had a large hiatal hernia and is pending GI  evaluation by Dr. Karilyn Cota and a possible EGD; this has not been scheduled  yet.  Ms. Karstens comes in today stating she has had about 5 weeks of  shortness of breath that has gradually got worse, more pronounced with  exertion.  She states when she sits in a recliner resting she feels  fine, but the minute she gets up to do anything else it become very  laboring for her.  Initially, just has the shortness of breath and  fatigue, yesterday she was cleaning up her bedroom, getting ready for  family to come  and visit, experienced sudden onset of heaviness across  her chest that radiated down both her arms.  She became diaphoretic and  nauseated.  She states these are the exact same symptoms she had prior  to her intervention in 2006.  In the last two nights, she has  been so  anxious with her breathing that she has actually slept in the recliner.  She denies any edema, orthopnea, or PND; she states it has just been  more anxiety-related secondary to the shortness of breath, and she lives  alone.   PAST MEDICAL HISTORY:  1. Coronary artery disease status post PCI with a drug-eluting stent      to the LAD in 2006, followup PCI to the diagonal with a kissing      balloon PTCA, residual disease 70% to the RCA.  2. Type 2 diabetes.  3. Hypertension.  4. Dyslipidemia.  5. Fibromyalgia.  6. Obesity.  7. Normal ejection fraction.  8. Status post cholecystectomy.  9. History of depression/anxiety.  10.Laparoscopic repair of ventral incisional hernia.  11.Hiatal hernia/GERD.   SOCIAL HISTORY:  The patient lives in [place] alone.  She is on disability,  has never been married, and has no children; denies any drug, herbal  medication, EtOH, or tobacco use;  tries to follow a diabetic diet;  sedentary lifestyle.   FAMILY HISTORY:  Mother deceased secondary to complications of CHF;  otherwise family noncontributory.   REVIEW OF SYSTEMS:  Positive for sweats and blurred vision when  hyperglycemic, chest pain, shortness of breath, dyspnea on exertion,  cough, increased frequency, nocturia, generalized weakness, depression,  anxiety, myalgia, GERD symptoms, polydipsia, and heat intolerance.  All  other systems reviewed and negative.   ALLERGIES:  Include CODEINE, MORPHINE, and PENICILLIN.   MEDICATIONS:  Include:  1. Lantus insulin 48 units subcu b.i.d.  2. Plavix 75.  3. Atenolol.  4. Aspirin.  5. Janumet.  6. Cymbalta 60 mg daily.  7. Tekturna.   PHYSICAL EXAMINATION:  VITAL SIGNS:  Temperature 98.2, heart rate  initially was 104, currently is 95; respirations initially documented at  32, currently 20; blood pressure 121/53, sat 97% on room air.  GENERAL:  In no acute distress, somewhat anxious.  HEENT: Unremarkable.  NECK:  Supple without  lymphadenopathy, no bruits.  No JVD.  CARDIOVASCULAR:  Exam reveals S1 and S2, slightly tachycardic.  LUNGS:  Clear to auscultation.  Distant breath sounds bilaterally.  SKIN:  Currently warm and dry.  ABDOMEN:  Soft, nontender, positive bowel sounds.  Obese.  LOWER EXTREMITIES:  Without clubbing, cyanosis, or edema.  She has 2+ DP  bilaterally.  NEUROLOGICAL:  Alert and oriented x3.   Chest x-ray show no acute findings.  EKG:  Sinus tachycardia at a rate  of 108 without acute ST or T-wave changes.  Lab work available at this  time for a set of point of care is negative.  Chemistry:  Sodium 136,  potassium 4.9, chloride 104, BUN 32, creatinine 1.4, glucose 218.  CBC  is pending.   IMPRESSION:  1. Unstable angina.  2. Diabetes.  3. Hypertension.  4. Obesity.  5. Dyslipidemia.  6. Known history of coronary artery disease status post previous      intervention.   PLAN:  Admit the patient, cycle enzymes; continue nitroglycerin and  heparin; resume statin if no intolerance; continue aspirin, beta-  blocker, and Plavix and schedule for cath in the a.m.  Dr. Charlton Haws  has been into examine and assess the patient and agrees with plan of  care.      Dorian Pod, ACNP      Noralyn Pick. Eden Emms, MD, [facility]  Electronically Signed    MB/MEDQ  D:  08/05/2007  T:  08/06/2007  Job:  [id number]   cc:   Catalina Pizza, M.D.
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
## 2010-07-10 NOTE — Letter (Signed)
August 11, 2006    Catalina Pizza, M.D.  [address]  [place],  [place] [address]   RE:  Laura, [name]  MRN:  [id number]  /  DOB:  [birth year]   Dear [name]:   It was my pleasure to see [name] as an outpatient at the [facility]  Cardiology Office for evaluation of her peripheral vascular disease.   HISTORY OF PRESENT ILLNESS:  As you know [name] is a 65 year old  woman who presents with bilateral leg pain, left worse than right.  She  has also had a lot of swelling in the left leg.  She describes a long-  standing history of left leg swelling as well as painful varicosities.  She has tried some compression wraps but has not tolerated them well.  She also complains of numbness in her feet, knee pain, she has had both  knees replaced and complained of her left leg giving out with walking.  Sometimes she can walk for up to an hour at a very slow pace without  symptoms and other times she has symptoms at very short distances.  She  has similar symptoms in the right leg but they are not as much of a  problem and do not affect her as much as the left leg.  She also has had  some difficulty with gait problems and vertigo.  Her pain is diffuse and  involves the calf as well as the posterior thigh area.  She has not had  problems with ulcers or nonhealing wounds.   PAST MEDICAL HISTORY IS PERTINENT FOR THE FOLLOWING:  1. Coronary artery disease.  She has undergone percutaneous coronary      intervention in the past by Dr. Juanda Chance.  She has not had a recent      evaluation as she has been asymptomatic from a cardiac standpoint.  2. Type 2 diabetes.  3. Essential hypertension.  4. Dyslipidemia.  5. Bilateral total knee arthroplasty; right knee in 2005, left knee in      2006.  6. Acoustic neuroma 1993.  7. Hysterectomy in 1993.  8. Cholecystectomy remotely.  9. Morton's neuroma surgery remotely.  10.Cataract surgery.  11.Fibromyalgia.   CURRENT MEDICATIONS INCLUDE:  1. Diovan 160 mg  daily.  2. Lipitor 20 mg daily.  3. Atenolol 50 mg daily.  4. Metformin 850 mg twice daily.  5. Insulin as needed.  6. Cymbalta 60 mg daily.  7. Lyrica 75 mg daily.  8. Calcium plus D 600 mg daily.  9. Multivitamins daily.  10.Aspirin 325 mg daily.   ALLERGIES INCLUDE:  CODEINE, PENICILLIN, AND MORPHINE.   FAMILY HISTORY:  There is no documented history of coronary artery  disease in the family.  The patient's mother died of congestive heart  failure.   SOCIAL HISTORY:  The patient lives alone.  She is single.  She has been  retired since 1994 and is on disability.  She does not smoke cigarettes  or drink alcohol.  She does not do any regular exercise.   REVIEW OF SYSTEMS:  A complete 12-point review of systems was performed.  Pertinent positives included constipation, fatigue, reflux, anxiety,  depression, dizziness and shortness of breath.   PHYSICAL EXAMINATION:  GENERAL:  The patient is alert and oriented.  She  is an obese white female in no acute distress.  VITAL SIGNS:  Her weight is 243 pounds, blood pressure 130/72 in the  right arm, 120/70 in the left arm, heart rate is  80, respiratory rate is  16.  HEENT:  Normal.  NECK:  Normal carotid upstrokes without bruits.  Jugular venous pressure  is normal.  No thyromegaly or thyroid nodules.  LUNGS:  Clear to auscultation bilaterally.  HEART:  The apex is not palpable.  The heart is regular rate and rhythm  without murmurs or gallops.  ABDOMEN:  Soft, obese, nontender.  No organomegaly.  No abdominal  bruits.  BACK:  No CVA tenderness.  EXTREMITIES:  Femoral pulses are 2+ and equal bilaterally without  bruits.  There is 2+ edema in the left lower extremity and 1+ edema in  the right lower extremity.  The edema is pretibial in location.  There  is a large engorged varicosity in the medial aspect of the left leg.  It  is exquisitely tender to palpation.  Posterior tibial and dorsalis pedis  pulses are obtainable by  Doppler in the left foot, on the right foot the  dorsalis pedis pulse is 2+, the posterior tibial pulse is obtainable by  Doppler.  SKIN:  Warm and dry.  There are some stasis changes in the lower legs  but no rashes.  Sensation to light touch in the plantar aspect of the  feet is decreased.  NEUROLOGIC:  Cranial nerves II-XII are intact.  Strength is 5/5 and  equal in the arms and legs bilaterally.   STUDIES:  Venous duplex from November 2007 showed no DVT although there  was low flow in the superficial femoral vein on the left, so there was  some question in that area.  There was moderate venous incompetence  bilaterally.  ABIs performed in [place] were 0.94 on the right and  0.69 on the left.   Hemoglobin A1c was 8.3.  Total cholesterol was 172 with an LDL of 95 and  an HDL of 33.   ASSESSMENT:  [name] is a 65 year old woman with multiple medical  problems presenting for evaluation of leg pain.  I suspect her leg pain  is multifactorial.  She clearly has both venous and arterial disease  involving the left leg.  She also has fibromyalgia and has prior knee  replacements.  In this type of patient with multiple problems there is  always some question to the benefit of treating peripheral arterial  disease.  This is especially the case in the absence of clear-cut  claudication.  I do think it would be helpful to better define her  arterial disease and I have requested a lower extremity arterial duplex  scan.  This may help in decision making regarding revascularization.  I  also think she may benefit from treatment of her venous disease.  She  has an exquisitely tender varicosity in the left leg and after her  arterial disease is better defined I may consider referral to the CVTS  group for their opinion on her venous disease.   [name], you appear to be aggressively treating [name]' cardiac and  vascular risk factors as her blood pressure is under ideal control.  I  know her  diabetes has been very difficult to treat.  She is on  appropriate antiplatelet therapy with aspirin.  I do not think she would  obtain much benefit from Pletal at this point.   Thanks again for allowing me to see [name].  I will be in touch after  her arterial studies are completed.  I also may send her for a vascular  surgery evaluation for treatment of her venous disease.  Sincerely,      [name]. Excell Seltzer, MD  Electronically Signed    MDC/MedQ  DD: 08/11/2006  DT: 08/11/2006  Job #: [id number]

## 2010-07-10 NOTE — Discharge Summary (Signed)
[name], [name]                ACCOUNT NO.:  [id number]   MEDICAL RECORD NO.:  [id number]          PATIENT TYPE:  INP   LOCATION:  3737                         FACILITY:  [facility]   PHYSICIAN:  Bevelyn Buckles. Bensimhon, MDDATE OF BIRTH:  1945-05-15   DATE OF ADMISSION:  08/05/2007  DATE OF DISCHARGE:  08/08/2007                               DISCHARGE SUMMARY   PROCEDURES:  1. Cardiac catheterization.  2. Coronary arteriogram.  3. Left ventriculogram.  4. Esophagogastroduodenoscopy.  5. Capsule endoscopy.   PRIMARY/FINAL DISCHARGE DIAGNOSES:  Chest pain, no critical coronary  artery disease at catheterization and cardiac risk factor reduction  recommended.   SECONDARY DIAGNOSES:  1. Anemia, felt secondary to blood loss and iron deficiency.  2. Heme-positive stools, capsule endoscopy results pending, no      significant abnormality on esophagogastroduodenoscopy.  3. Diabetes.  4. Obesity.  5. Hypertension.  6. Fibromyalgia.  7. Status post ventral hernia repair.  8. Status post drug-eluting stent to the left anterior descending in      2006.  9. Status post percutaneous transluminal coronary angioplasty to the      diagonal.  10.Reported history of a hiatal hernia, not seen on      esophagogastroduodenoscopy.  11.Status post cholecystectomy.  12.History of depression and anxiety.  13.Allergy or intolerance to CODEINE, MORPHINE, and PENICILLIN.   TIME OF DISCHARGE:  42 minutes.   HOSPITAL COURSE:  [name] is a 65 year old female with a history of  coronary artery disease.  She had dyspnea on exertion for several weeks  and also developed chest pain.  She was admitted for further evaluation  and treatment.   Her initial CBC showed a hemoglobin of 8.6.  Her hematocrit was 26.8.  She received 1 unit of packed red cell.  Her hemoglobin improved to 9.5  but was drifting back down slightly to 8.8 with a hematocrit of 27 at  discharge.  She will be followed closely by GI.   Fecal occult blood was positive x2.  A GI consult was called, and she  had an EGD which showed no significant abnormalities.  A capsule  endoscopy was performed that is pending at the time of dictation.  GI  will follow her anemia closely.  She has also been started on iron  supplementation as her iron level was less than 10.   Cardiac catheterization was performed to define her anatomy and showed  nonobstructive coronary artery disease with a patent stent.  Her left  ventricular function was normal.  Cardiac risk factor reduction is  recommended.  She had a lipid profile which showed a total cholesterol  of 151, triglycerides of 216, HDL of 33, and LDL of 75.  She is on  Lipitor 20 mg a day, and no medication changes are made at this time.  She is encouraged to follow a strict diabetic diet and get good diabetes  control through her primary care physician.   By August 08, 2007, [name] had some slight weakness with ambulation but  felt that she was doing much better.  She is encouraged to  increase her  activity as tolerated and considered stable for discharge with close  outpatient followup.   DISCHARGE INSTRUCTIONS:  1. Her activity level is to be increased gradually.  2. She is to call our office for any problems with the cath site.  3. She is to follow up with Dr. Andee Lineman and our office will call her      with an appointment.  4. She is to follow up with Dr. Arlyce Dice next week with a CBC.  5. She is to follow up with Dr. Margo Aye as well.   DISCHARGE MEDICATIONS:  1. MiraLax 17 g daily.  2. Tekturna HCT 150/25 daily.  3. Aspirin 81 mg daily.  4. Tenormin 50 mg daily.  5. Colace 100 mg 1 or 2 tablets daily p.r.n.  6. Cymbalta 60 mg daily.  7. Janumet 50/1000 twice daily, restart August 08, 2007.  8. Lantus 48 units b.i.d.  9. Lipitor 20 mg daily.  10.Plavix is on hold.      Theodore Demark, PA-C      Bevelyn Buckles. Bensimhon, MD  Electronically Signed    RB/MEDQ  D:   08/08/2007  T:  08/08/2007  Job:  [id number]   cc:   Barbette Hair. Arlyce Dice, MD,FACG  Catalina Pizza, M.D.

## 2010-07-10 NOTE — Discharge Summary (Signed)
NAMEKENNEDY, BRINES                ACCOUNT NO.:  [id number]   MEDICAL RECORD NO.:  [id number]          PATIENT TYPE:  INP   LOCATION:  2502                         FACILITY:  [facility]   PHYSICIAN:  Veverly Fells. Excell Seltzer, MD  DATE OF BIRTH:  1945-07-09   DATE OF ADMISSION:  09/15/2008  DATE OF DISCHARGE:  09/16/2008                               DISCHARGE SUMMARY   PRIMARY CARDIOLOGIST:  Veverly Fells. Excell Seltzer, MD   DISCHARGING DIAGNOSES:  1. Coronary artery disease, class III angina, status post percutaneous      coronary intervention to the left anterior descending with a Xience      drug-eluting stent.  The patient with severe single-vessel disease,      underwent percutaneous coronary intervention with intracoronary      vascular ultrasound guidance.  Nonobstructive right coronary artery      stenosis.  Normal left circumflex.  Normal left ventricular      function.  2. Diastolic heart failure, acute on chronic exacerbation, initiation      of low-dose diuretic at the time of discharge.  3. Hypertension, stable.  4. Type 2 diabetes.   Ms. [name] is a 65 year old female with known history of coronary artery  disease and partial colectomy for colon cancer last year.  Over the last  few months, she has had increased dyspnea with exertion and chest  discomfort.  She was seen in the office by Dr. Excell Seltzer recently who felt  the patient warranted further diagnostic evaluation.  The patient  admitted and underwent cardiac catheterization on September 15, 2008, results  as stated above.  The patient tolerated the procedure without  complications.  Dr. Excell Seltzer in to see the patient on the day of  discharge.  Vital signs stable.  Hematocrit 34.9, potassium 4.2, and  creatinine 0.8.  The patient is being discharged home on formal optimal  blood pressure control.  We will add lisinopril 5 mg daily.  For CHF, we  will add Lasix 20 mg daily and 10 of KCl daily.  The patient will  require some financial  assistance with the cost of her Plavix.  We will  have Case management supply a 2-week prescription card in the assistance  program with [employer].  I suspect the patient will qualify.   Other medications at time of discharge include:  1. Nitroglycerin p.r.n.  2. Aspirin 325.  3. Furosemide 20.  4. K-Dur half a tablet of the 20 daily.  5. Plavix 75.  6. Lisinopril 5.  7. Actos 45 daily.  8. Atenolol 50 daily.  9. Folic acid daily.  10.Iron tablet daily.  11.Lantus insulin 55 units daily.  12.MiraLax p.r.n.  13.Multivitamin daily.  14.Omega-3 fish oil daily.   She has prescriptions for Plavix, nitroglycerin, furosemide, KCl, and  lisinopril.   Duration of discharge encounter is over 30 minutes.      Dorian Pod, ACNP      Veverly Fells. Excell Seltzer, MD  Electronically Signed    MB/MEDQ  D:  09/16/2008  T:  09/17/2008  Job:  [id number]   cc:   Humberto Leep.  Cassie Freer., M.D.

## 2010-07-10 NOTE — Op Note (Signed)
[name], [name]                ACCOUNT NO.:  [id number]   MEDICAL RECORD NO.:  [id number]          PATIENT TYPE:  INP   LOCATION:  0002                         FACILITY:  [facility]   PHYSICIAN:  Ardeth Sportsman, MD     DATE OF BIRTH:  1945-08-14   DATE OF PROCEDURE:  DATE OF DISCHARGE:                               OPERATIVE REPORT   PRIMARY CARE PHYSICIAN:  Catalina Pizza, MD   GASTROENTEROLOGIST:  Barbette Hair. Arlyce Dice, MD, [facility]   CARDIOLOGIST:  Learta Codding, MD, [facility]   SURGEON:  Ardeth Sportsman, MD   ASSISTANT:  Anselm Pancoast. Zachery Dakins, MD   PREOPERATIVE DIAGNOSES:  1. Cecal cancer.  2. History of numerous abdominal surgeries with recurrent abdominal      hernias.   POSTOPERATIVE DIAGNOSES:  1. Descending colon cancer.  2. History of numerous abdominal surgeries with recurrent abdominal      hernias.  3. Supraumbilical midline incisional hernias.   PROCEDURES PERFORMED:  1. Laparoscopic lysis of adhesions x3 hours (equals 3/4 of the case).  2. Laparoscopically-assisted right hemicolectomy with ileotransverse      colon anastomosis.  3. Primary closure of supraumbilical midline incisional hernias (Swiss-      cheese type).   ANESTHESIA:  1. General anesthesia.  2. Local anesthetic and field block around all port sites and      extraction site.  3. On-Q pain pump with bupivicaine.   DRAINS:  None.   ESTIMATED BLOOD LOSS:  300 mL.   COMPLICATIONS:  No major complications.   INDICATIONS:  [name] is a pleasant 65 year old morbidly obese female  who has had numerous abdominal surgeries in the past, including numerous  incisional hernias and lysis of adhesions for bowel obstruction.  She  had anemia, and was found on colonoscopy to have a mass in her proximal  right colon about 2.5 cm in size with biopsy concerning for  adenocarcinoma.   The anatomy and physiology of the digestive tract was explained.  Pathophysiology of colon cancer was explained.  Options discussed  and  recommendation was made for a segmental colonic resection.  Though I  would try and start doing this laparoscopically, it had been known that  in numerous prior surgeries that there is a high likelihood of  converting to open.  Risks, benefits, and alternatives were discussed in  detail and questions were answered.  She and her family agreed to  proceed.   PERIOPERATIVE FINDINGS:  She had dense adhesions of greater omentum and  small bowel to almost her entire anterior abdominal wall, sparing just  lateral to the medial clavicular line.  She had numerous pieces of mesh  in her abdomen with some evidence of some midline hernias as well.  She  had a mixture of concrete-like adhesions in certain areas and very soft,  friable, fibrinous tissue in other areas.  There was no evidence of any  bowel obstruction.  She had an obvious mass in her right proximal colon.  There was no evidence of any metastatic disease.   DESCRIPTION OF PROCEDURE:  Informed consent was confirmed.  The patient  received IV clindamycin and ciprofloxacin given her numerous allergies.  She received subcutaneous heparin just prior to surgery.  She received  oral Entereg just prior to surgery.  She had sequential compression  devices active during the entire case.  She underwent general anesthesia  without any difficulty.  She was positioned supine with arms tucked with  arm slides.  Her Foley catheter was sterilely placed.  Her abdomen and  panniculus were prepped and draped in sterile fashion.   Entry was gained into the abdomen in the left upper quadrant along the  left subcostal ridge after orogastric tube was placed and the patient  was placed in left side up and reverse Trendelenburg for optical entry  technique.  The capnoperitoneum to to 15 mmHg, we had good  intraabdominal insufflation.  There was no evidence of any  intraabdominal injury.  Over time, we ended up placing numerous 5 mm  ports on the left  flank, left lower quadrant, right lower quadrant,  right lower flank, and right upper quadrant to get peripheral views.   She had dense adhesions of omentum as well as numerous loops of small  bowel adherent to her anterior abdominal wall.  Careful meticulous  dissection was done to free these off.  At one point in the right lower  quadrant there was a segment of mesh that was densely adherent to the  small bowel, and I did have to trim about a centimeter strip of mesh  with the small bowel to avoid any fistulae or injuries.  There was no  evidence of any enterotomies.  Meticulous care was done.   Eventually, after switching from different quadrants and different  angles, I was able to whittle away and free off the adhesions.  The most  dense areas were in the superior midline between her midline incision  and her right upper quadrant paramedian incision, where I also saw some  Swiss cheese patch of some hernias with some omentum, but no bowel  within it.  She also had dense adhesions around her umbilicus with mesh  intact.  Her right lower quadrant area had the most dense area of small  bowel adhesions as well.  This is where there were some dense adhesions  of the mesh, but I eventually freed those off.   This was primarily done with cold scissors with an occasional focused  cautery or LigaSure, but over 95% of it was done with just cold sharp  scissors to avoid enterotomies.   Enough working space was created such that a capnoperitoneum was  evacuated and a supraumbilical 6 cm incision was made.  A GelPort was  placed and capnoperitoneum was reintroduced.  The greater omentum  attachments to small bowel were freed off, primarily laparoscopically.  Later in the case, after eviscerating the greater omentum, we freed off  numerous adhesions of small bowel to greater omentum as well.  Tried to  avoid over-freeing.  There was no evidence of any bowel obstruction.  Greater omentum was  freed off the right colon.  Greater omentum was  freed off the proximal transverse to mid colon as well using LigaSure.   The right colon and ileum were freed in a lateral and medial fashion by  placing tension on the right colon towards the left side and freeing  it  and rolling off, the retromesenteric adhesions freed off.  Some duodenal  sweep could be seen and was kept swept posteriorly.  She did have some  moderate dense adhesions from prior open cholecystectomy, but these were  able to be carefully freed off and skeletonized.  Over time I was able  to completely normalize it.   Capnoperitoneum was evacuated through the ports.  The right colon was  eviscerated.  I was not able to get the entire hepatic flexure and  proximal transverse colon freed off, so I had to return capnoperitoneum  and do some further dissection and superior to inferior mobilization,  but eventually I was able to free that off well and re-eviscerate.  A  side-to-side anastomosis of distal ileum, about 8 cm proximal to the  ileocecal valve, was made to the mid transverse colon, sparing a good  branch of the middle colic artery.  The staple defect was closed using a  TA 60.  The mesentery was taken in a ray-like fashion using primary  LigaSure, and then also using 2-0 silk ties at the base of the ileocecal  and right middle colic branches as well.  This was done with good  result.  Specimen was sent off intact.  I could obviously palpate the  mass in the mid ascending colon.  The mesenteric defect was  reapproximated using 2-0 figure-of-eight silk stitches to completely  close the defect well.  Specimen was sent.   Capnoperitoneum was reinstituted and camera inspection revealed good  hemostasis.  Copious irrigation was done.  Over 4 liters of irrigation  was done with good clean return.  Hemostasis was good.  There was no  evidence of any enterotomies or any  other abnormalities.  The greater  omentum laid  well.   Capnoperitoneum was evacuated.  Part of the right lower quadrant mesh  had peeled back a little bit, and so it was reattached using #1 Novofil  towards the midline, taking bite through the subcutaneous fascia,  through the mesh, down the mesh, and then back in through the mesh and  then brought out to fascia again for a good U-type stitch.  That helped  re-tack the mesh well and help retighten any potential right lower  quadrant defect.   The midline wound was closed using #1 PDS looped stitch x2.  Doing that,  I was able to reclose some of the Swiss cheese hernia defects in the  superior part of the wound well.  The wound was irrigated copiously with  saline.  Port sites in the midline were approximated using staples.  A  wick and triple antibiotic ointment was placed on the  wound.  Sterile dressing was applied.  Note:  An On-Q pain pump had been  placed with the tails in the preperitoneal plane prior to closing the  fascia.   The patient was extubated and sent to the recovery room in stable  condition.      Ardeth Sportsman, MD  Electronically Signed     SCG/MEDQ  D:  12/28/2007  T:  12/28/2007  Job:  [id number]   cc:   Catalina Pizza, M.D.  Fax: [phone]   Barbette Hair. Arlyce Dice, MD,FACG  [address]  [place]  [place] [address]   Learta Codding, MD,FACC  [address]  [place], [place] [address]

## 2010-07-10 NOTE — Discharge Summary (Signed)
[name], [name]                ACCOUNT NO.:  [id number]   MEDICAL RECORD NO.:  [id number]          PATIENT TYPE:  INP   LOCATION:  2502                         FACILITY:  [facility]   PHYSICIAN:  Veverly Fells. Excell Seltzer, MD  DATE OF BIRTH:  [birth year]   DATE OF ADMISSION:  09/15/2008  DATE OF DISCHARGE:                               DISCHARGE SUMMARY   ADDENDUM   DISCHARGE DIAGNOSIS:  Coronary artery disease, status post cardiac  catheterization on this admission.      Dorian Pod, ACNP      Veverly Fells. Excell Seltzer, MD  Electronically Signed    MB/MEDQ  D:  09/16/2008  T:  09/16/2008  Job:  [id number]

## 2010-07-13 NOTE — Op Note (Signed)
NAMESENAIDA, CHILCOTE                ACCOUNT NO.:  [id number]   MEDICAL RECORD NO.:  [id number]          PATIENT TYPE:  AMB   LOCATION:  DAY                          FACILITY:  [facility]   PHYSICIAN:  Sharlet Salina T. Hoxworth, M.D.DATE OF BIRTH:  11-Sep-1945   DATE OF PROCEDURE:  01/22/2006  DATE OF DISCHARGE:                               OPERATIVE REPORT   PREOPERATIVE DIAGNOSIS:  Recurrent ventral incisional hernia.   POSTOPERATIVE DIAGNOSIS:  Recurrent ventral incisional hernia.   SURGICAL PROCEDURE:  Laparoscopic repair of recurrent ventral incisional  hernia.   SURGEON:  Lorne Skeens. Hoxworth, M.D.   ANESTHESIA:  General.   BRIEF HISTORY:  Jesse Hirst is a 65 year old female with diabetes and  morbid obesity.  She has a history of multiple abdominal surgeries  including cholecystectomy through a right paramedian incision,  hysterectomy through a low midline incision, laparotomy for small bowel  obstruction and subsequently repair of a lower abdominal incisional  hernia with mesh at [facility] in 2002.  She had a postoperative  bowel obstruction at that time.  She developed a very large seroma  several months postoperatively, which I had drained.  She now recently  presents with a recurrent painful bulge in the right lower abdomen.  CT  scan has confirmed a hernia in the right anterolateral pelvis containing  small bowel and omentum.  She is difficult to examine due to obesity,  but does have tenderness and apparent reducible mass in that area.  After extensive discussion regarding options, we have elected proceed  with laparoscopic and possible open repair with mesh.  The nature of the  procedure, its indications, risks of bleeding, infection, bowel injury,  recurrence, cardiorespiratory complications and the high likelihood of  converting to an open procedure were discussed and understood.  She is  now brought to the operating room for this procedure.   DESCRIPTION OF  OPERATION:  The patient was brought to the operating room  and placed in supine position on the operating table and general  orotracheal anesthesia was induced.  She received broad-spectrum  preoperative antibiotics.  PAS were placed.  Foley catheter was placed.  The abdomen was widely sterilely prepped and draped with Betadine and  Ioban drape.  Correct patient and procedure were verified.  Abdominal  access was obtained in the left upper quadrant laterally with an open  Hasson technique without difficulty and pneumoperitoneum established.  Video laparoscopy revealed extensive adhesions all along the midline.  Under direct vision, two 5-mm trocars were placed in the left midabdomen  and left lower quadrant.  Following this, an extensive adhesiolysis  ensued.  The majority of the adhesions were omentum.  As I initially  worked toward the midline, these were fairly filmy omental adhesions;  however, we soon encountered the large piece of apparent polypropylene  mesh that had been placed previously and this was either intra-abdominal  or a bridging piece of mesh, as it was exposed to the peritoneal cavity.  There were very dense adhesions all along this large piece of mesh in  the midline and extending over toward the right  midabdomen.  There were  some small bowel adhesions to this as well.  However, we had excellent  visualization and we felt that the adhesiolysis, although very tedious,  was progressing satisfactorily without undue risk of bowel injury.  Therefore, I did persistent with the extensive adhesiolysis, eventually  completely dissecting the omentum and several loops of small bowel back  off the mesh and exposing lateral to the mesh in the right lower  quadrant a good-sized hernia defect.  Further adhesions were completely  taken down in the lower abdomen and right abdomen more laterally,  completely exposing the defect.  There were some omental adhesions up  into it that were  taken down.  This was a discrete defect, again  occurring at the lateral edge of the previously placed mesh in the right  lower quadrant, and it measured 11 x 6 cm in diameter.  The total time  for the adhesiolysis was 3 hours.  Following this, a piece of Proceed  dual-sided mesh, 20 x 15 cm, was chosen for broad coverage of the  defect.  Six stay sutures were placed in corresponding areas on the  anterior abdominal wall and marked and small stab incisions made.  The  mesh was introduced unfurled and the stay sutures brought up through the  anterior abdominal wall.  These were tied down into place with very nice  deployment of the mesh widely around the defect.  The mesh was then  tacked circumferentially with concentric circles of Endotaks, securely  anchoring it.  Again, there was broad coverage for at least 5 cm in all  directions from the edge of the defect.  The abdomen was then again  inspected for any evidence of bowel injuries and none was seen.  A small  amount of blood was suctioned and there was no evidence of bleeding.  Following this, all CO2 was evacuated, trocars removed and a mattress  suture was secured in the left upper quadrant.  The skin incisions were  closed with interrupted subcuticular 5-0 Monocryl and Dermabond.  Sponge, needle and instrument count were correct.  The patient was taken  to Recovery in satisfactory, having tolerated the procedure well.      Lorne Skeens. Hoxworth, M.D.  Electronically Signed     BTH/MEDQ  D:  01/22/2006  T:  01/23/2006  Job:  [id number]

## 2010-07-13 NOTE — Cardiovascular Report (Signed)
[name] NO.:  [id number]   MEDICAL RECORD NO.:  [id number]          PATIENT TYPE:  OIB   LOCATION:  2899                         FACILITY:  [facility]   PHYSICIAN:  Charlies Constable, M.D. [facility] DATE OF BIRTH:  01/01/1946   DATE OF PROCEDURE:  09/07/2004  DATE OF DISCHARGE:                              CARDIAC CATHETERIZATION   CLINICAL HISTORY:  [name] is 65 years old and has documented coronary  disease at catheterization about two months ago.  At that time she had a 70-  80% LAD stenosis and 70% stenosis in the diagonal branch and a 70% stenosis  in the right coronary artery.  We were not certain if these were tight  enough to cause ischemia and planned outpatient functional study.  I do not  believe she ever had the functional study but she did have continued chest  pain and was brought back today for consideration for intervention.   PROCEDURE:  The procedure was performed via the right femoral artery using  arterial sheath and 6-French preformed coronary catheters.  A front wall  arterial puncture was performed and Omnipaque contrast was used.  After  completion of the diagnostic study, made a decision to proceed with  intravascular ultrasound of the left anterior descending artery.  Patient  was given Angiomax bolus and infusion and was given 600 mg of Plavix.  We  passed an [employer] wire down the LAD and advanced an IVUS catheter  down to the LAD and did automatic pullback.  This documented that the lesion  located between the diagonal branch and septal perforator was quite tight  with dimensions of about 1.5 x 1.8.  The distal reference was 2.75 and the  proximal reference was 3.  The lesion at the diagonal branch had about 180  degrees of calcium just opposite the diagonal branch.   We elected to proceed with intervention on the bifurcation in the LAD and  diagonal branch.  We passed a second [employer] wire down the diagonal  branch.   We dilated the main channel with a 2.5 x 15 mm Maverick performing  two inflations up to 10 atmospheres for 30 seconds.  We then dilated the  diagonal branch with a 2.5 x 10 mm cutting balloon performing three  inflations up to 8 atmospheres for 30 seconds.  We debated about doing a  crush technique, but decided against it and stented the LAD with a 2.5 x 23  mm CYPHER stent deploying this with one inflation up to 16 atmospheres for  30 seconds.  We then advanced a wire through the stent into the diagonal  branch.  This was accomplished with some difficulty.  We used an Marketing executive.  We then dilated the side branch with a 2.5 x 15 mm Maverick  performing two inflations up to 10 atmospheres for 30 seconds.  We then  dilated the LAD stent with a 3 x 20 mm Quantum Maverick performing two  inflations up to 16 atmospheres for 30 seconds.  We then did kissing balloon  passing the 2.5 x 15 mm Maverick down in the diagonal branch leaving the 3 x  20 mm Quantum Maverick in the main LAD and performed one inflation up to 8  atmospheres for 30 seconds with both balloons.  Final diagnostic study was  then performed through the guiding catheter.  Patient tolerated procedure  well and left the laboratory in satisfactory condition.   RESULTS:  Left main coronary artery:  Left main coronary artery was free of  significant disease.   Left anterior descending artery:  Left anterior descending artery gave rise  to a diagonal branch and two septal perforators.  There was 70% stenosis at  the ostium of the diagonal branch.  There was 80% stenosis at the LAD at the  septal perforator with segmental disease extending proximal to the diagonal  branch.   Circumflex artery:  The circumflex artery gave rise to two marginal branches  and a posterolateral branch.  These vessels were free of significant  disease.   Right coronary artery:  The right coronary artery is a very large vessel,  gave rise to a  right ventricular branch, a posterior descending branch, and  four posterolateral branches.  There was 70% narrowing in the mid right  coronary artery at the right ventricular branch.   LEFT VENTRICULOGRAM:  The left ventriculogram performed in the RAO  projection showed good wall motion with no areas of hypokinesis.  The  estimated ejection fraction was 60%.   Following stenting of the LAD the stenosis improved from 80% to 0%.   Following PTCA of the diagonal branch the stenosis improved from 70% to 40%  following kissing balloon angioplasty.   CONCLUSIONS:  1.  Coronary artery disease with 80% narrowing in the proximal left anterior      descending, 70% narrowing in the ostium of the first diagonal branch, no      significant obstruction in the circumflex artery, 70% narrowing in the      mid right coronary artery, and normal left ventricular function.  2.  Successful PCI of a bifurcation lesion of the left anterior descending      and diagonal branch with improvement in the left anterior descending      stenosis from 80% to 0% using a CYPHER stent and improvement in the      diagonal branch from 70% to 40% using a cutting balloon and kissing      balloon angioplasty.   DISPOSITION:  Patient return to postanesthesia care unit for further  observation.       BB/MEDQ  D:  09/07/2004  T:  09/07/2004  Job:  [id number]   cc:   [name], M.D.   CP Lab   [name], M.D. [facility]

## 2010-07-13 NOTE — Discharge Summary (Signed)
NAME:  [name], [name]                          ACCOUNT NO.:  [id number]   MEDICAL RECORD NO.:  [id number]                   PATIENT TYPE:  INP   LOCATION:  0342                                 FACILITY:  [facility]   PHYSICIAN:  Sharlet Salina T. Hoxworth, M.D.          DATE OF BIRTH:  11-30-1945   DATE OF ADMISSION:  12/22/2001  DATE OF DISCHARGE:  12/30/2001                                 DISCHARGE SUMMARY   DISCHARGE DIAGNOSIS:  Infected postoperative seroma.   OPERATIONS AND PROCEDURES:  Incision and drainage of abdominal wall seroma  on 12/22/01.   HISTORY OF PRESENT ILLNESS:  The patient is a 65 year old female who  underwent a repair of a large ventral abdominal wall hernia in 12/02, at  [facility].  Since that time, she has had an  uncomfortable bulge in her lower abdomen that gradually increased.  She  presented to my office in August, and was found to have a massive abdominal  wall seroma and several liters of fluid were drained.  This subsequently  recurred after a number of aspirations, and seroma casts were placed in the  office.  These drained moderately well, but she now has developed recurrence  of her seroma associated with redness of the abdominal wall and fever.  The  patient is admitted for open drainage of an apparent infected seroma.   PAST MEDICAL HISTORY:  1. Number of abdominal procedures, including cholecystectomy, hysterectomy,     laparotomy for bowel obstruction.  2. Arthroscopy.  3. Nasal septum repair.  4. Tonsillectomy.  5. She is treated for adult onset diabetes mellitus, oral agent controlled.  6. Hypertension.  7. Hyperlipidemia.   MEDICATIONS:  1. Glucophage 850 mg b.i.d.  2. Vioxx 25 mg q.d.  3. Phenergan 25 mg p.r.n.  4. Amaryl 4 mg q.d.  5. Atenolol 50 mg q.d.  6. Altace 5 mg q.d.  7. Clarinex 5 mg q.d.  8. Lipitor 5 mg q.d.   ALLERGIES:  1. MORPHINE.  2. PENICILLIN.  3. CODEINE.   SOCIAL HISTORY:  Please see  detailed dictated office note.   FAMILY HISTORY:  Please see detailed dictated office note.   REVIEW OF SYMPTOMS:  Please see detailed dictated office note.   PHYSICAL EXAMINATION:  GENERAL:  She is an overweight white female in no  acute distress.  ABDOMEN:  There is a large lower abdominal midline fluid collection with  overlying erythema and some tenderness.   HOSPITAL COURSE:  The patient was admitted the morning of her procedure and  underwent open drainage of a large infected seroma.  The cavity was  debrided, and packed with Betadine gauze.  Following this, she underwent  daily moist saline gauze wound packing.  She immediately felt better, and  the erythema of her abdominal wall resolved along with IV antibiotics.  A  VAC drain was placed on 12/28/01, with good function.  She continued to  improve, and was switched over to p.o. Keflex on 12/29/01.  She was  discharged home on 12/30/01, with the [facility] in place.   DISCHARGE MEDICATIONS:  1. She will continue Keflex for another week.  2. Regular medications.   WOUND CARE:  Home health with assist with management of the VAC drain.   FOLLOWUP:  She is to return to my office in approximately one week.                                                Lorne Skeens. Hoxworth, M.D.    Tory Emerald  D:  02/09/2002  T:  02/09/2002  Job:  [id number]

## 2010-07-13 NOTE — Assessment & Plan Note (Signed)
[facility]                          [facility] OFFICE NOTE   NAME:[name], [name]                       MRN:          [id number]  DATE:05/20/2006                            DOB:          Dec 23, 1945    HISTORY OF PRESENT ILLNESS:  The patient is a pleasant 65 year old  female with history of two vessel coronary artery disease.  The patient  was recently seen on March 13, 2006.  She was actually doing quite  well.  We actually made a decision to stop her Plavix.  She was more  than a year and a half out from her coronary intervention.  However, the  patient called our office back due to a single episode of chest pain  with left shoulder pain that she experienced prior to a doctor's visit.  The patient stated that she was rather anxious that day.  She did  develop some tightness in the arm.  It lasted approximately five  minutes.  Interestingly, there were no exertional symptoms, however, and  she reported no exertional chest pain or shortness of breath.  She also  has had no recurrent problems since this single event.  She was referred  back to our office to discuss the need for any further work-up.  An EKG  in the office today demonstrates a normal sinus rhythm with no acute  changes.  The patient, in the interim, has been doing quite well.  She  did not have any p.r.n. nitroglycerin available.   MEDICATIONS:  1. Diovan/hydrochlorothiazide 320 mg p.o. daily.  2. Atenolol 50 mg p.o. daily.  3. Vitamin B12.  4. Glucophage 850 mg b.i.d.  5. Multivitamin daily.  6. Calcium.  7. Plavix has been discontinued.  8. Enteric coated aspirin 325 daily.  9. Lipitor 20 mg p.o. daily.  10.Insulin 20 units every evening.   PHYSICAL EXAMINATION:  VITAL SIGNS:  Blood pressure 140/77, heart rate  61 beats per minute, weight 247 pounds.  NECK:  Normal carotid upstroke, no carotid bruits.  LUNGS:  Clear breath sounds bilaterally.  CARDIOVASCULAR:  Regular  rate and rhythm, normal S1 and S2.  No murmurs,  rubs, or gallops.  ABDOMEN:  Soft, nontender, no rebounds or guarding.  Good bowel sounds.  EXTREMITIES:  No clubbing, cyanosis, or edema.  NEUROLOGIC:  Patient alert and oriented.  Grossly nonfocal.   PROBLEM LIST:  1. Coronary artery disease.      a.     Status post drug eluting stent to the left anterior       descending and percutaneous coronary intervention to the diagonal       vessel June 2006.      b.     Residual 70% right coronary artery disease.      c.     Single episode of chest pain, no exertional symptoms.      d.     Normal left ventricular function.  2. Obesity.  3. Diabetes mellitus.  4. Status post hernia surgery.   PLAN:  1. I do not think that patient has symptoms that are necessarily  consistent with angina.  It was a single episode and there are no      exertional symptoms.  Continue on her current medical regimen.  2. Patient can follow up with [place] in six months.  3. Plavix can still be held in the interim.  If the patient on future      Cardiolite study, has evidence of ischemia, certainly at that point      in time will reconsider keeping the patient indefinitely on Plavix      drug therapy.     Laura Codding, MD,FACC  Electronically Signed    GED/MedQ  DD: 05/20/2006  DT: 05/20/2006  Job #: [id number]

## 2010-07-13 NOTE — Discharge Summary (Signed)
[name], [name]                  ACCOUNT NO.:  [id number]   MEDICAL RECORD NO.:  [id number]          PATIENT TYPE:  OIB   LOCATION:  6526                         FACILITY:  [facility]   PHYSICIAN:  [place] Bing, M.D.  DATE OF BIRTH:  07/09/1945   DATE OF ADMISSION:  09/07/2004  DATE OF DISCHARGE:  09/08/2004                                 DISCHARGE SUMMARY   PROCEDURES:  1.  Cardiac catheterization.  2.  Coronary arteriogram.  3.  Left ventriculogram.  4.  Percutaneous transluminal coronary angiography with Cypher stent to one      vessel.   DISCHARGE DIAGNOSES:  1.  Unstable anginal pain, status post percutaneous transluminal coronary      angiography and Cypher stent to the left anterior descending reducing      the stenosis from 80% to 0 and percutaneous transluminal coronary      angiography to the diagonal reducing the stenosis from 70% to 40%.  2.  Residual nonobstructive coronary artery disease of 70% in the right      coronary artery.  3.  Preserved left ventricular function with an ejection fraction of 60%.  4.  Allergy or intolerance to codeine, morphine and penicillin.  5.  Diabetes mellitus.  6.  Hypertension.  7.  Hyperlipidemia.  8.  A headache which was exacerbated by Imdur.  9.  Obesity.  10. Status post cholecystectomy.  11. History of Morton neuroma of the left foot.  12. Status post bilateral knee replacement.  13. Status post hernia repair.  14. Status post left eye cataract surgery.  15. History of depression.   HOSPITAL COURSE:  [name] is a 65 year old female who had a cardiac  catheterization in May, and medical management was felt the best option.  She was seen in the office on August 17, 2004, for recurrent anginal symptoms  and scheduled for repeat catheterization and possible intervention.  She was  admitted for this on September 07, 2004.   At the cardiac catheterization, she had an 80% LAD treated with PTCA and  Cypher stent.  Diagonal was  treated with PTCA as well. Residual RCA disease  is to be evaluated as an outpatient.   The next day, her catheterization site was without significant oozing or  hematoma.  She was mildly anemic postprocedure with a hemoglobin that went  from 12.7-11.4.  This was felt secondary to blood loss during procedure.  Her Glucophage is on hold and she will restart that in 48 hours.  Her chest  x-ray had some subsegmental atelectasis at the left base, but otherwise no  acute disease.  [name] was evaluated by Dr. Dietrich Pates and considered  stable for discharge on September 08, 2004.   ACTIVITY:  Activity level and wound care is to be per the  postcatheterization discharge instruction sheet.  She is to call our office  for problems with the cath site.   FOLLOW UP:  She is to follow up with Suszanne Conners. Elise Benne, P.A. for Dr. Andee Lineman  on July 27, at 2:15 p.m. and with Dr. Doyne Keel as needed.  DISCHARGE MEDICATIONS:  1.  Atenolol 50 mg q.d  2.  Celexa 20 mg q.d.  3.  Aspirin 325 mg q.d.  4.  Actos 45 mg q.d.  5.  Glucophage 850 mg b.i.d., restart September 10, 2004.  6.  Vytorin 10/20 mg q.d.  7.  Plavix 75 mg q.d.  8.  Nitroglycerin sublingual p.r.n.       RB/MEDQ  D:  09/08/2004  T:  09/08/2004  Job:  [id number]   cc:   [facility]   Dr. Doyne Keel

## 2010-07-13 NOTE — Assessment & Plan Note (Signed)
[facility]                            [facility]ICE NOTE   NAME:[name], [name]                         MRN:          [id number]  DATE:11/21/2005                            DOB:          1945-03-20    REASON FOR PROCEDURE:  Forrest Moron, M.D.   HISTORY OF PRESENT ILLNESS:  The patient is a 65 year old female with a  history of two-vessel coronary artery disease.  The patient is status post  Cypher stent to the LAD for an 80% stenosis and PCI for a diagonal lesion of  70%.  The patient has residual nonobstructive 70% stenosis in the right  coronary artery disease.  Previous stress testing showed no ischemia in this  distribution.  The patient has been doing relatively well.  Unfortunately  she has gained some weight. She has increased shortness of breath which she  states is a little bit similar to the symptoms she experienced when  presenting with initial two-vessel coronary artery disease.  She has some  swelling in the left leg and has significant superficial varicosities.  She  denies any orthopnea, PND. She has been tested in the past for sleep apnea.  She was found to have a deviated nasal septum and this has been corrected.  She denies, however, any substernal chest pain.   Her EKG in the office today demonstrates  normal sinus rhythm.  No acute  ischemic changes.   MEDICATIONS:  1. Atenolol 50 mg p.o. daily.  2. Aspirin 325 mg p.o. daily.  3. Glucophage 850 mg p.o. b.i.d.  4. Multivitamin.  5. Calcium and vitamin D.  6. Plavix 75 mg a day.  7. Lipitor 20 mg daily.  8. Fish oil.  9. Glimepiride 4 mg daily.  10.Red yeast rice.   PHYSICAL EXAMINATION:  VITAL SIGNS:  Blood pressure 128/70, heart rate 61  beats per minute, and weight 270 pounds.  NECK:  Normal carotid upstrokes.  No carotid bruits.  LUNGS:  Clear.  Breath sounds bilaterally.  HEART:  Regular rate and rhythm.  Normal S1 and S2.  No murmurs, rubs or  gallops.  ABDOMEN:  Soft.  EXTREMITIES:  No cyanosis or clubbing.  There is 1-2+ peripheral pitting  edema but more so on the left than on the right.  The patient also has  significant varicosities bilaterally.  She is status post left knee surgery  with a well-healed scar.   PROBLEM LIST:  1. Coronary artery disease.  See details as outlined above.      a.     Status post drug-eluting stent to the left anterior descending       and percutaneous coronary intervention to a diagonal vessel.      b.     Residual 70% coronary artery disease.  2. Normal left ventricular systolic function.  3. Obesity.  4. Diabetes mellitus.  5. Antiplatelet therapy with Plavix due to drug-eluting stent.  6. Schedule for hernia surgery.   PLAN:  1. The patient does report increased dyspnea.  Will make sure that it is  not related to nonspecific symptoms related to her underlying coronary      artery disease. S he will be scheduled for a stress test which is a      four-minute adenosine low-level exercise Cardiolite protocol to be done      in the next couple of days.  2. If the stress study is within normal limits, the patient can safely      proceed with hernia surgery.  3. Instruct the patient to stop Plavix one week before surgery as      indicated.  She will need to resume Plavix, however, as soon as      possible after surgery when felt safe by Dr. Johna Sheriff.  4. Otherwise the patient can follow up with [place] in six months.       Learta Codding, MD,FACC     GED/MedQ  DD:  11/21/2005  DT:  11/23/2005  Job #:  [id number]   cc:   Wende Crease MD  Lorne Skeens. Hoxworth, M.D.

## 2010-07-13 NOTE — Cardiovascular Report (Signed)
NAMEPARKER, SAWATZKY NO.:  [id number]   MEDICAL RECORD NO.:  [id number]          PATIENT TYPE:  OIB   LOCATION:  6501                         FACILITY:  [facility]   PHYSICIAN:  Charlies Constable, M.D. [facility] DATE OF BIRTH:  28-May-1945   DATE OF PROCEDURE:  07/13/2004  DATE OF DISCHARGE:                              CARDIAC CATHETERIZATION   CLINICAL HISTORY:  Ms. Drummonds is 65 years old and has diabetes,  hyperlipidemia, hypertension, and positive family history of coronary  disease.  She has had symptoms of chest pain radiating to the neck area with  activity such as walking up stairs.  She was seen in consultation by Dr.  Andee Lineman who arranged for her to be evaluated with coronary angiography in the  outpatient laboratory.   PROCEDURE:  The procedure was performed via the right femoral artery using  arterial sheath and 6 French preformed coronary catheters. A front wall  arterial puncture was performed and Omnipaque contrast was used.  Nitroglycerin was given to further evaluate the lesions in the LAD and right  coronary artery.  The patient tolerated the procedure well and left the  laboratory in satisfactory condition.   RESULTS:  Left main coronary artery:  The left main coronary was free of  significant disease.   Left anterior descending artery:  The left anterior descending artery gave  rise to a diagonal branch and two septal perforators.  There was 70%  stenosis in the ostial portion of the diagonal branch.  There was 70%  stenosis in the proximal LAD just after the septal perforator.  There was  mild calcification in this area.   Circumflex artery:  The circumflex artery gave rise to a large marginal,  small marginal, and small posterior lateral branch.  These vessels were free  of significant disease.   Right coronary artery:  The right coronary artery is a moderate size vessel  that gave rise to a right ventricular branch, posterior descending branch,  and  three posterior lateral branches.  There was 70% narrowing in the mid  portion of the vessel right at the take off of the acute marginal branch.   LEFT VENTRICULOGRAM:  The left ventriculogram performed in the RAO  projection showed good wall motion with no areas of hypokinesis.  The  estimated ejection fraction was 60%.   HEMODYNAMIC DATA:  Aortic pressure was 157/77 with a mean of 113.  Left  ventricular pressure was 157/29.   CONCLUSIONS:  Coronary artery disease with 70% stenosis in the proximal left  anterior descending artery, 70% stenosis in the diagonal branch of the left  anterior descending, no significant obstruction of the circumflex artery,  70% narrowing in the mid right coronary artery, and normal left ventricular  function.   RECOMMENDATIONS:  I am not certain if the lesions are tight enough to cause  ischemia.  Her symptoms sounded fairly typical for angina.  I will discuss  the options with Dr. Andee Lineman.  We might consider a functional  testing and then percutaneous coronary intervention of the LAD or right  coronary artery  depending on those findings.  We might consider just  bringing her back for IVUS with intention to probably inform angioplasty on  the LAD lesion.  Also, review these pictures with Dr. Gerri Spore.      BB/MEDQ  D:  07/13/2004  T:  07/13/2004  Job:  [id number]   cc:   Dr. Aaron Mose, M.D. [facility]

## 2010-07-13 NOTE — Op Note (Signed)
NAME:  [name], [name]                          ACCOUNT NO.:  [id number]   MEDICAL RECORD NO.:  [id number]                   PATIENT TYPE:  AMB   LOCATION:  DSC                                  FACILITY:  [facility]   PHYSICIAN:  Sharlet Salina T. Hoxworth, M.D.          DATE OF BIRTH:  Oct 17, 1945   DATE OF PROCEDURE:  11/16/2001  DATE OF DISCHARGE:                                 OPERATIVE REPORT   PREOPERATIVE DIAGNOSIS:  Abdominal wound seroma.   POSTOPERATIVE DIAGNOSIS:  Abdominal wound seroma.   PROCEDURE:  Evacuation/drainage of wound seroma.   SURGEON:  Lorne Skeens. Hoxworth, M.D.   ANESTHESIA:  General.   BRIEF HISTORY:  The patient is a 65 year old white female with diabetes and  obesity who in December 2002 underwent repair of a large incarcerated,  recurrent ventral abdominal hernia using mesh at [facility].  Postoperatively she had developed progressive swelling in her lower abdomen.  She presented to my office several weeks ago with a large mass in the low  midline in the area of her previous incision.  CT scan revealed a very large  seroma with intact mesh and fascia at the hernia repair site.  At that time  I aspirated over 3 L of serous fluid in the office and placed a Seroma Cath  drain.  This has continued to drain persistently and in recent days the  drainage has become cloudy and the seroma is increasing in size clinically.  I have recommended open incision and drainage under general anesthesia in  the operating room.  The nature of the procedure, its indications and risks  of bleeding, infection, and recurrent seroma were discussed and understood.  She is now brought to the operating room for the procedure.   DESCRIPTION OF PROCEDURE:  The patient was brought to the operating room and  placed in the supine position on the operating table, and general  endotracheal anesthesia was induced.  She received broad-spectrum  preoperative antibiotics.  The entire abdomen was  sterilely prepped and  draped.  There was a large mass in the abdominal wall at and below the  umbilicus.  I made an approximately 4 cm incision near the umbilicus in the  old midline incision, and dissection was carried down through the  subcutaneous tissue onto the capsule of the seroma, which was opened.  A  large amount of fluid, approximately 1 L to 1200 cc, was drained.  This was  cloudy but had no odor.  The seroma cavity contained a large amount of tan,  shaggy material consistent with old organizing clot.  Using blunt dissection  this was completely evacuated and another approximately 500 cc volume of  this material was removed from this large cavity.  There appeared to be good  fibrous encapsulation of the mesh at the base of the cavity, and I did not  see any exposed mesh in any area.  The debris  from the cavity was  meticulously debrided and the cavity irrigated with saline and then  antibiotic solution.  Two 19 French Blake drains were placed through  separate stab wounds of the lower abdomen into the cavity and sutured in  place with 3-0 nylon.  The cavity was further irrigated with antibiotic  solution and then the skin closed with staples.  Sponge, needle, and  instrument counts were correct.  Dry sterile dressings were applied and the  patient taken to recovery in good condition.                                               Lorne Skeens. Hoxworth, M.D.   Tory Emerald  D:  11/16/2001  T:  11/17/2001  Job:  [id number]

## 2010-07-13 NOTE — Op Note (Signed)
   NAME:  [name], [name]                          ACCOUNT NO.:  [id number]   MEDICAL RECORD NO.:  [id number]                   PATIENT TYPE:  INP   LOCATION:  0342                                 FACILITY[facility]  [facility]   PHYSICIAN:  Sharlet Salina T. Hoxworth, M.D.          DATE OF BIRTH:  Sep 26, 1945   DATE OF PROCEDURE:  12/22/2001  DATE OF DISCHARGE:                                 OPERATIVE REPORT   PREOPERATIVE DIAGNOSIS:  Abdominal wall abscess.   POSTOPERATIVE DIAGNOSIS:  Abdominal wall abscess.   PROCEDURE:  Incision and drainage of abdominal wall abscess.   SURGEON:  Lorne Skeens. Hoxworth, M.D.   ANESTHESIA:  General.   INDICATIONS:  The patient is a 65 year old white female who has a history of  a large incarcerated incisional hernia repaired with mesh at [facility] in December of last year.  She subsequently developed a large  chronic seroma which drained with the catheters several weeks ago.  She now  has developed recurrent seroma with evidence of infection with fever and  erythema of the abdominal wall.  Open drainage has been recommended and  accepted.  The nature of the procedure, indications and risks and bleeding  and infection were discussed and understood.  She is now brought to the  operating room for this procedure.   DESCRIPTION OF PROCEDURE:  The patient was brought to the operating room and  placed in the supine position and general endotracheal anesthesia was  induced.  She received preoperative antibiotics.  The abdomen was sterilely  prepped and draped.  The previous midline incision was used excising the old  scar down into the subcutaneous tissue and into the infected seroma.  A  large amount of thin, purulent fluid was drained.  The cavity was quite  large.  There was no real debris in the cavity.  The mesh appeared to be  well incorporated.  The cavity was irrigated with warm saline and then  packed with two Betadine soaked Kerlix gauze.  Sponge and  needle counts were  correct.  Dry sterile dressings were applied, and the patient was taken to  the recovery room in good condition.                                               Lorne Skeens. Hoxworth, M.D.    Tory Emerald  D:  12/23/2001  T:  12/23/2001  Job:  [id number]

## 2010-07-13 NOTE — Assessment & Plan Note (Signed)
[facility]                          [facility] OFFICE NOTE   NAME:[name], Laura MARSAN                       MRN:          [id number]  DATE:03/13/2006                            DOB:          20-Jan-1946    HISTORY OF PRESENT ILLNESS:  The patient is a 65 year old female with  history of two-vessel coronary artery disease.  The patient is status  post Cypher stent to the LAD and has residual nonobstructive disease in  the right coronary artery.  She has been doing quite well.  She reports  no recurrence of substernal chest pain, shortness of breath, orthopnea,  PND.  She has been on a weight loss reducing diet and has lost  approximately 30 pounds.  The patient states that she feels much better  in regards to dyspnea and exercise tolerance.  She does see Dr. Margo Aye now  in [place], who started her recently on insulin.  The patient has had  for years difficulty in controlling her blood pressure.  However, from a  cardiovascular perspective she appears to be doing well.  She did have  significant hypertension more recently and her  Diovan/hydrochlorothiazide was doubled in dose.  She does not recall,  however, her current dose.   MEDICATIONS:  1. Diovan/hydrochlorothiazide dose unknown.  2. Atenolol 50 mg p.o. daily.  3. Vitamin B12.  4. Glucophage 850 mg p.o. b.i.d.  5. Multivitamin.  6. Calcium.  7. Vitamin D.  8. Enteric-coated aspirin 325 daily.  9. Lipitor 20 mg p.o. daily.  10.Insulin 20 units q.p.m.   PHYSICAL EXAMINATION:  VITAL SIGNS:  Blood pressure 148/70, heart rate  72 beats per minute, weight is 260 pounds.  NECK:  Normal carotid upstroke, no carotid bruits.  LUNGS:  Clear breath sounds bilaterally.  HEART:  Regular rate and rhythm, normal S1 and S2.  No murmur, rubs or  gallops.  ABDOMEN:  Soft.  EXTREMITIES:  No cyanosis, clubbing or edema.  NEUROLOGIC:  The patient is alert, oriented, and grossly nonfocal.   PROBLEM LIST:  1. Coronary artery disease.      a.     Status post drug-eluting stent to the left anterior       descending artery and percutaneous coronary intervention to a       diagonal vessel.      b.     Residual 70% right coronary artery disease.      c.     No recurrent chest pain.      d.     Normal left ventricular function.  2. Obesity, but with weight loss.  3. Diabetes mellitus.  4. Status post hernia surgery.   PLAN:  1. The patient continues to do well from a cardiovascular perspective.      No further ischemia testing is needed.  2. The patient can stop Plavix.  3. The patient will follow up with [place] in 6 months.     Learta Codding, MD,FACC  Electronically Signed    GED/MedQ  DD: 03/13/2006  DT: 03/13/2006  Job #: [id number]

## 2010-07-25 ENCOUNTER — Encounter: Payer: Self-pay | Admitting: Cardiovascular Disease

## 2010-08-07 ENCOUNTER — Ambulatory Visit: Payer: Medicare Other | Admitting: Cardiovascular Disease

## 2010-09-04 ENCOUNTER — Encounter (HOSPITAL_COMMUNITY): Payer: Medicare Other

## 2010-09-04 ENCOUNTER — Other Ambulatory Visit (INDEPENDENT_AMBULATORY_CARE_PROVIDER_SITE_OTHER): Payer: Self-pay | Admitting: Surgery

## 2010-09-04 LAB — BASIC METABOLIC PANEL
BUN: 20 mg/dL (ref 6–23)
Chloride: 98 mEq/L (ref 96–112)
GFR calc Af Amer: >60 mL/min (ref >60)
GFR calc non Af Amer: >60 mL/min (ref >60)
Potassium: 5.3 mEq/L — ABNORMAL HIGH (ref 3.5–5.1)
Sodium: 135 mEq/L (ref 135–145)

## 2010-09-04 LAB — CBC
HCT: 42.2 % (ref 36.0–46.0)
MCHC: 33.2 g/dL (ref 30.0–36.0)
Platelets: 230 10*3/uL (ref 150–400)
RDW: 14.3 % (ref 11.5–15.5)
WBC: 7.8 10*3/uL (ref 4.0–10.5)

## 2010-09-04 LAB — SURGICAL PCR SCREEN: MRSA, PCR: NEGATIVE

## 2010-09-11 ENCOUNTER — Inpatient Hospital Stay (HOSPITAL_COMMUNITY)
Admission: RE | Admit: 2010-09-11 | Discharge: 2010-09-14 | DRG: 336 | Disposition: A | Discharge status: Skilled Nursing Facility | Payer: Medicare Other | Source: Ambulatory Visit | Attending: Surgery | Admitting: Surgery

## 2010-09-11 DIAGNOSIS — Z01812 Encounter for preprocedural laboratory examination: Secondary | ICD-10-CM

## 2010-09-11 DIAGNOSIS — Z9889 Other specified postprocedural states: Secondary | ICD-10-CM

## 2010-09-11 DIAGNOSIS — I10 Essential (primary) hypertension: Secondary | ICD-10-CM | POA: Diagnosis present

## 2010-09-11 DIAGNOSIS — K631 Perforation of intestine (nontraumatic): Secondary | ICD-10-CM

## 2010-09-11 DIAGNOSIS — C182 Malignant neoplasm of ascending colon: Secondary | ICD-10-CM | POA: Diagnosis present

## 2010-09-11 DIAGNOSIS — K660 Peritoneal adhesions (postprocedural) (postinfection): Secondary | ICD-10-CM | POA: Diagnosis present

## 2010-09-11 DIAGNOSIS — I2510 Atherosclerotic heart disease of native coronary artery without angina pectoris: Secondary | ICD-10-CM | POA: Diagnosis present

## 2010-09-11 DIAGNOSIS — E119 Type 2 diabetes mellitus without complications: Secondary | ICD-10-CM | POA: Diagnosis present

## 2010-09-11 DIAGNOSIS — K432 Incisional hernia without obstruction or gangrene: Secondary | ICD-10-CM

## 2010-09-11 DIAGNOSIS — IMO0001 Reserved for inherently not codable concepts without codable children: Secondary | ICD-10-CM | POA: Diagnosis present

## 2010-09-11 DIAGNOSIS — C184 Malignant neoplasm of transverse colon: Secondary | ICD-10-CM | POA: Diagnosis present

## 2010-09-11 DIAGNOSIS — Z9861 Coronary angioplasty status: Secondary | ICD-10-CM

## 2010-09-11 DIAGNOSIS — K3184 Gastroparesis: Secondary | ICD-10-CM | POA: Diagnosis present

## 2010-09-11 LAB — GLUCOSE, CAPILLARY
Glucose-Capillary: 171 mg/dL — ABNORMAL HIGH (ref 70–99)
Glucose-Capillary: 193 mg/dL — ABNORMAL HIGH (ref 70–99)
Glucose-Capillary: 206 mg/dL — ABNORMAL HIGH (ref 70–99)

## 2010-09-11 LAB — HEMOGLOBIN A1C: Mean Plasma Glucose: 197 mg/dL — ABNORMAL HIGH (ref <117)

## 2010-09-11 LAB — GLUCOSE, RANDOM: Glucose, Bld: 153 mg/dL — ABNORMAL HIGH (ref 70–99)

## 2010-09-12 LAB — CBC
HCT: 34.6 % — ABNORMAL LOW (ref 36.0–46.0)
MCH: 29.6 pg (ref 26.0–34.0)
MCV: 88.9 fL (ref 78.0–100.0)
RBC: 3.89 MIL/uL (ref 3.87–5.11)
WBC: 9.5 10*3/uL (ref 4.0–10.5)

## 2010-09-12 LAB — GLUCOSE, CAPILLARY
Glucose-Capillary: 194 mg/dL — ABNORMAL HIGH (ref 70–99)
Glucose-Capillary: 191 mg/dL — ABNORMAL HIGH (ref 70–99)
Glucose-Capillary: 227 mg/dL — ABNORMAL HIGH (ref 70–99)
Glucose-Capillary: 166 mg/dL — ABNORMAL HIGH (ref 70–99)
Glucose-Capillary: 143 mg/dL — ABNORMAL HIGH (ref 70–99)

## 2010-09-12 LAB — POTASSIUM: Potassium: 4.7 mEq/L (ref 3.5–5.1)

## 2010-09-12 LAB — CREATININE, SERUM: GFR calc Af Amer: >60 mL/min (ref >60)

## 2010-09-13 LAB — CBC
HCT: 32.4 % — ABNORMAL LOW (ref 36.0–46.0)
MCH: 28.8 pg (ref 26.0–34.0)
MCHC: 31.8 g/dL (ref 30.0–36.0)
MCV: 90.5 fL (ref 78.0–100.0)
Platelets: 175 10*3/uL (ref 150–400)
RDW: 14.9 % (ref 11.5–15.5)

## 2010-09-13 LAB — GLUCOSE, CAPILLARY
Glucose-Capillary: 184 mg/dL — ABNORMAL HIGH (ref 70–99)
Glucose-Capillary: 112 mg/dL — ABNORMAL HIGH (ref 70–99)
Glucose-Capillary: 148 mg/dL — ABNORMAL HIGH (ref 70–99)

## 2010-09-13 LAB — CREATININE, SERUM: GFR calc Af Amer: >60 mL/min (ref >60)

## 2010-09-13 NOTE — Op Note (Signed)
[name], [name]                ACCOUNT NO.:  [id number]  MEDICAL RECORD NO.:  [id number]  LOCATION:  DAYL                         FACILITY:  [facility]  PHYSICIAN:  Ardeth Sportsman, MD     DATE OF BIRTH:  1945-12-31  DATE OF PROCEDURE:  09/11/2010 DATE OF DISCHARGE:                              OPERATIVE REPORT   PRIMARY CARE PHYSICIAN:  Kingsley Callander. Ouida Sills, MD  ONCOLOGIST:  Lynett Fish, M.D.  SURGEON:  Ardeth Sportsman, MD  ASSISTANT:  RN.  PREOPERATIVE DIAGNOSIS:  Recurrent ventral incisional hernia status post numerous abdominal surgeries and prior hernia repair.  POSTOPERATIVE DIAGNOSIS:  Recurrent ventral incisional hernia status post numerous abdominal surgeries and prior hernia repair (massive loss of domain).  PROCEDURE PERFORMED: 1. Laparoscopic lysis of adhesions x3 hours. 2. Laparoscopic enterotomy repair. 3. No hernia repair done.  ANESTHESIA: 1. General anesthesia. 2. Local anesthetic in a field block on the port site.  SPECIMENS:  None.  DRAINS:  None.  ESTIMATED BLOOD LOSS:  100 mL.  COMPLICATIONS:  Small bowel enterotomy identified and repaired.  INDICATIONS:  [name] is a 65 year old morbidly obese female who has had numerous prior abdominal surgeries (cholecystectomy, hysterectomy, open lysis of adhesions for small-bowel obstruction with ventral hernia repair, infraumbilical ventral hernia repair one open and one laparoscopic, lap-assisted right colectomy in 2009, lap-assisted right transverse colectomy in 2011 with status post adjuvant chemotherapy. She has developed recurrent hernias.  She has been having some discomfort of this.  No episodes of incarceration or strangulation.  I had a long discussion with the patient about her options.  I cautioned her with the numerous abdominal surgeries, she is at risk for small bowel injury or other problems.  However, she has discomfort and wished to have consideration of repair.  Offered to try and  approach laparoscopically with understanding that it may be converted to open or she may need a more definite repair.  However, upon exam, it seems she just had 2 supraumbilical spots.  Technique, risks, benefits, and alternatives were discussed in detail. She wished to proceed.  She got cardiac clearance.  OPERATIVE FINDINGS:  She had a significant loss of domain/diastasis. Infraumbilically, her old hernia repairs and old mesh had some eventration in the loss of domain.  Infraumbilically, she had 2 functional major hernias.  Supraumbilically, she had numerous ventral hernias.  No meshing was involved there.  She had large swiss-cheese defects in the region of 20 x 30 cm.  She had a 1 cm enterotomy that I repaired primarily with interrupted silk stitches.  DESCRIPTION OF PROCEDURE:  Informed consent was confirmed.  The patient received IV clindamycin and gentamicin given her penicillin and other allergies.  She underwent general anesthesia without difficulty.  She had a Foley catheter still in place.  She was positioned supine, her arms were tucked.  Her abdomen and mons pubis were hooked, prepped and draped in sterile fashion.  Surgical timeout was then planned.  I placed a 5 mm port in the right upper quadrant.  Then using optical entry technique with the patient in steep reverse Trendelenburg and right side up.  I did this and her more symptomatic  defect seemed to be more left paramedian supraumbilical.  I was able to induce carbon dioxide insufflation.  On camera inspection, we noted dense adhesions medially but superiorly and laterally I did have an open pocket.  I did try to come around but the whole right side of the abdomen was covered in adhesions.  I could look into the left upper quadrant and follow that and she had less adhesions in the left upper quadrant.  I could see 2 obvious infraumbilical hernias, they were not incarcerated.  I placed 5 mm ports in the left upper  quadrant x3 especially on the flank.  I did some further lysis of adhesions.  I placed another port in the left lower quadrant.  I did sharp dissection to help free attachments off the anterior abdominal wall.  They were rather dense adhesions with a few pockets of thinner filmy adhesions.  I mostly did this with cold scissors.  In the few areas where there was some more thick omentum that was densely adhered and I was certain that I did not have any bowel nearby, I did use some focused cautery on the scissors.  She did get some oozing from this.  The central area had extremely dense adhesions with numerous loops of small bowel dense and adhered to old mesh as well as into the midline.  That took most of the time.  Eventually, was able to free up the left lower quadrant then the pelvis, and then the right upper quadrant and gradually after alternating cameras between the left side and the right side superiorly and inferiorly come around until I could eventually free.  Note during the dissection, in the central area, I did see a small dot of bile.  I did inspection and found an enterotomy.  It was less than 1 cm in size.  I repaired it transversely using 2-0 silk interrupted stitches using laparoscopic repair.  I did meticulous inspection with [place].  There were a couple of mildly lax areas so I put more stitches there until I had a good repair.  The lumen looked patent. I did copious irrigation and hemostasis was good.  I completed lysis of adhesions and eventually I freed off the small bowel and abdominal contents off the abdominal wall.  I did copious irrigation of several liters to release old blood.  I inspected the bowel, although it was somewhat frozen centrally for it had been adhered to the old mesh and old incisions.  I could not find any enterotomies or abnormalities, I took quite sometime double checking.  I looked at the enterotomy repair.  It looked healthy, intact, and  viable.  I looked up and found that there were more than just a few hernias in the upper quadrant but there was some a massive loss of domain transversely round 20 cm.  I extended vertically for about 30 cm.  There were a few bands of some fascia bridging intact fascia along the midline and transversely sort of splinting up into about the second pockets. Infraumbilically, the old mesh was actually pretty well intact.  It had eventrated some but the more obvious center defects were more superiorly.  I called my partner, Dr. Luretha Murphy, in.  He inspected as well.  At this point because of the enterotomy, I was not comfortable placing mesh in at this moment given the large complexity of the case.  Dr. Daphine Deutscher agreed.  I also felt a laparoscopic approach would not be very feasible as  I would have to use a 25 x 35 cm mesh x2 and there was no way I could get the ports into evaluate.  She has not had any definite evidence of incarceration or strangulation. I did not feel component separation or open portion of incision open repair, especially when the enterotomy was feasible at this time.  He agreed.  We therefore decided to abort the hernia repair.  I did again copious irrigation with a few more liters of saline.  I inspected the enterotomy repair and it was healthy and intact.  I was hoping to try and get some omentum mobilized and lay an omental patch over the enterotomy repair just to help protect and protecting its abdominal wall.  I did mobilize some of the remaining greater omentum, left upper quadrant, however, was rather stuck up there and I could not get a good tongue to reach down in the enterotomy repair without alot of tension, so I aborted that.  Hemostasis was good.  I evacuated carbon dioxide and removed the 5 mm ports.  I closed the skin with Monocryl.  We did gauze and paper tape given her problems with Tegaderm.  She has been extubated in the recovery room.  She has  no family here available, nor did she have anybody she wished me to call and talk to.  I will talk to her later.  I have to refer her to a major hernia repair center to see the risks and benefits if it is worth more intense and morbid open component separation repair that would be required to ultimately repair this.     Ardeth Sportsman, MD     SCG/MEDQ  D:  09/11/2010  T:  09/11/2010  Job:  [id number]  Electronically Signed by Karie Soda MD on 09/13/2010 08:53:01 AM

## 2010-09-14 ENCOUNTER — Encounter: Payer: Self-pay | Admitting: Cardiovascular Disease

## 2010-09-14 ENCOUNTER — Inpatient Hospital Stay
Admission: RE | Admit: 2010-09-14 | Discharge: 2010-09-29 | Disposition: A | Discharge status: Home / Self Care | Payer: Self-pay | Source: Ambulatory Visit | Attending: Internal Medicine | Admitting: Internal Medicine

## 2010-09-14 LAB — GLUCOSE, CAPILLARY: Glucose-Capillary: 136 mg/dL — ABNORMAL HIGH (ref 70–99)

## 2010-09-14 LAB — CBC
MCV: 89 fL (ref 78.0–100.0)
Platelets: 208 10*3/uL (ref 150–400)
RDW: 14.6 % (ref 11.5–15.5)
WBC: 5.3 10*3/uL (ref 4.0–10.5)

## 2010-09-14 LAB — CREATININE, SERUM
Creatinine, Ser: 0.86 mg/dL (ref 0.50–1.10)
GFR calc Af Amer: >60 mL/min (ref >60)

## 2010-09-18 ENCOUNTER — Ambulatory Visit: Payer: Medicare Other | Admitting: Cardiovascular Disease

## 2010-09-18 LAB — GLUCOSE, CAPILLARY
Glucose-Capillary: 170 mg/dL — ABNORMAL HIGH (ref 70–99)
Glucose-Capillary: 130 mg/dL — ABNORMAL HIGH (ref 70–99)
Glucose-Capillary: 99 mg/dL (ref 70–99)
Glucose-Capillary: 190 mg/dL — ABNORMAL HIGH (ref 70–99)
Glucose-Capillary: 78 mg/dL (ref 70–99)
Glucose-Capillary: 187 mg/dL — ABNORMAL HIGH (ref 70–99)
Glucose-Capillary: 130 mg/dL — ABNORMAL HIGH (ref 70–99)
Glucose-Capillary: 161 mg/dL — ABNORMAL HIGH (ref 70–99)
Glucose-Capillary: 133 mg/dL — ABNORMAL HIGH (ref 70–99)

## 2010-09-19 LAB — GLUCOSE, CAPILLARY
Glucose-Capillary: 102 mg/dL — ABNORMAL HIGH (ref 70–99)
Glucose-Capillary: 108 mg/dL — ABNORMAL HIGH (ref 70–99)
Glucose-Capillary: 115 mg/dL — ABNORMAL HIGH (ref 70–99)

## 2010-09-19 NOTE — Discharge Summary (Signed)
NAMESHANELL, ADEN                ACCOUNT NO.:  [id number]  MEDICAL RECORD NO.:  [id number]  LOCATION:  1539                         FACILITY:  [facility]  PHYSICIAN:  Ardeth Sportsman, MD     DATE OF BIRTH:  [birth year]  DATE OF ADMISSION:  09/11/2010 DATE OF DISCHARGE:  09/14/2010                              DISCHARGE SUMMARY   PRIMARY CARE PHYSICIAN:  Kingsley Callander. Ouida Sills, MD  DATE OF DISCHARGE:  09/14/2010  PRINCIPAL DISCHARGE DIAGNOSES:  Recurrent ventral incisional herniae.  PROCEDURE PERFORMED:  Laparoscopic lysis of adhesions and enterotomy repair on September 11, 2010.  Hernia repair was not done.  Other diagnoses include: 1. Coronary artery disease, followed by Dr. [name] of     Cardiology. 2. 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 Isabel Caprice. 3. Diabetes, insulin requiring. 4. Hypertension. 5. Gastroparesis. 6. Fibromyalgia. 7. Status post hysterectomy. 8. Status post cholecystectomy. 9. Status post breast biopsy. 10.Status post tonsillectomy. 11.Status post jaw bone surgery. 12.Status post surgery of a deviated septum. 13.Status post open ventral hernia repair in 1990s. 14.Status post exploratory laparotomy, lysis of adhesions, and ventral     hernia repair with mesh in early 2000s. 15.Status post laparoscopic ventral hernia repair with mesh in 2007.  MEDICATIONS:  They are noted in the system and include nitroglycerin, methadone, aspirin, atenolol, Prozac, lisinopril, fish oil, multivitamin, metformin, Humalog 25 mg q.a.c., Lantus 40 units b.i.d., Crestor.  SUMMARY OF HOSPITAL COURSE:  [name] is a 65 year old morbidly obese diabetic female who has required recent surgeries for colon cancer with chemotherapy and so far seems to be a survivor of this.  She has developed recurrent herniae.  They seem to be primarily umbilical.  I took her to the operating room.  I did  lysis of adhesions and she had extensive dense adhesions.  Did have a small enterotomy, which I had to repair.  She had a massive defect, 20 x 30 cm, Swiss cheese defect.  I felt this defect was too large to repair laparoscopically.  Given the enterotomy, I then felt it was appropriate to place a mesh.  I discussed the case with Dr. Daphine Deutscher intraoperatively and he agreed.  Therefore, the case was aborted.  Postoperatively, she was advanced on her diet.  She mobilized.  We backed off her IV fluids.  We removed her Foley catheter.  By postop day #3, she was walking in the hallways, had adequate pain control and was eating well.  We then felt it would be reasonable to discharge home with following instructions:  1. She is to return to clinic to see me in about 2 to 3 weeks to make     sure she is continuing to improve. 2. She may shower with the incision keeping clean and dry and do     dressings p.r.n.  She has problems with lot of adhesive tapes     especially Tegaderm, so paper tape is applied since that seems to     control things. 3. She should follow up with Dr. [name], her  primary care     physician, for diabetic management.  Her hemoglobin A1c was     elevated around 8.  This may be readjusted.  We backed off a little     bit in the hospital and her sugars run from 140 to 200, that needs     to be monitored and followed. 4. She may need to be referred to a major hernia center.  Given her     elevated BMI, this is probably going to be a challenge for     candidacy.  We will keep an abdominal binder for now. 5. She should call if she has worsening fever, chills, sweats, nausea,     pain, uncontrolled diarrhea, etc.     Ardeth Sportsman, MD     SCG/MEDQ  D:  09/13/2010  T:  09/13/2010  Job:  [id number]  Electronically Signed by Karie Soda MD on 09/19/2010 08:27:06 AM

## 2010-09-20 LAB — GLUCOSE, CAPILLARY: Glucose-Capillary: 81 mg/dL (ref 70–99)

## 2010-09-21 LAB — GLUCOSE, CAPILLARY
Glucose-Capillary: 135 mg/dL — ABNORMAL HIGH (ref 70–99)
Glucose-Capillary: 147 mg/dL — ABNORMAL HIGH (ref 70–99)

## 2010-09-21 NOTE — H&P (Signed)
[name], [name]                ACCOUNT NO.:  [id number]  MEDICAL RECORD NO.:  [id number]  LOCATION:                                 FACILITY:  PHYSICIAN:  Kingsley Callander. Ouida Sills, MD       DATE OF BIRTH:  1945-08-04  DATE OF ADMISSION: DATE OF DISCHARGE:  LH                             HISTORY & PHYSICAL   HISTORY OF PRESENT ILLNESS:  This patient is a 65 year old white female, who was transferred from [name] to the [facility] after having an aborted procedure to repair of ventral hernia.  She states that hernia development was too extensive for next to be placed.  She had a laparoscopic procedure.  She was deconditioned and was essentially 2 weeks ago at home and care for herself following this procedure.  She had recently been recovering from back surgery as well by Dr. Sylvie Farrier. She has received physical therapy.  She had continued her chronic pain medications.  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  MEDICATIONS: 1. Lisinopril 40 mg daily. 2. Atenolol 50 mg daily. 3. Metformin 1000 mg b.i.d. 4. Prozac 40 mg daily. 5. Multivitamin daily. 6. Fish oil daily. 7. Aspirin 81 mg daily. 8. Flexeril 10 mg p.r.n. 9. Lantus 40 units b.i.d. 10.Humalog 25 units t.i.d. 11.Methadone 5 mg b.i.d.  ALLERGIES:  MORPHINE, CODEINE, and PENICILLIN.  FAMILY HISTORY:  Her mother died at 30 of heart failure.  Father died at 68 of cirrhosis.  Her brother has had neuropathy.  SOCIAL HISTORY:  She does not use tobacco, alcohol, or drugs.  REVIEW OF SYSTEMS:  Noncontributory.  PHYSICAL EXAM:  GENERAL:  Alert and in no distress. HEENT:  Eyes, nose, and pharynx are unremarkable. NECK:  Supple  with no JVD or thyromegaly. LUNGS:  Clear. HEART:  Regular with no murmurs. ABDOMEN:  Reveals healing laparoscopy wound.  She is overweight. Abdomen is nontender with no palpable organomegaly. BACK:  Her back wound is healing well.  She is walking with a walker. EXTREMITIES:  No cyanosis, clubbing, or edema. NEURO:  At baseline. LYMPH NODES:  No cervical or supraclavicular enlargement.  IMPRESSION: 1. Status post laparoscopy for hernia repair, which was not completed. 2. Recent back surgery.  Continue physical therapy. 3. Diabetes.  Continue Lantus and Humalog. 4. Coronary artery disease status post two stents in 2010 and a stent     in 2006, stable. 5. Colon cancer.  She is followed closely by Dr. Lafayette Dragon in the Cancer     Clinic at [facility].  Her most recent colonoscopy was January 2012.     She has previously had a bowel resection in January 2011. 6. Fibromyalgia. 7. Status post bilateral knee replacement. 8. Hypertension.  Continue current treatment. 9. History of depression.  Continue Prozac.     Kingsley Callander. Ouida Sills, MD     ROF/MEDQ  D:  09/21/2010  T:  09/21/2010  Job:  [id number]

## 2010-09-22 LAB — GLUCOSE, CAPILLARY: Glucose-Capillary: 110 mg/dL — ABNORMAL HIGH (ref 70–99)

## 2010-09-23 LAB — GLUCOSE, CAPILLARY
Glucose-Capillary: 99 mg/dL (ref 70–99)
Glucose-Capillary: 84 mg/dL (ref 70–99)
Glucose-Capillary: 186 mg/dL — ABNORMAL HIGH (ref 70–99)

## 2010-09-25 ENCOUNTER — Encounter (INDEPENDENT_AMBULATORY_CARE_PROVIDER_SITE_OTHER): Payer: Self-pay | Admitting: General Surgery

## 2010-09-26 ENCOUNTER — Ambulatory Visit (INDEPENDENT_AMBULATORY_CARE_PROVIDER_SITE_OTHER): Payer: Medicare Other | Admitting: Surgery

## 2010-09-26 ENCOUNTER — Encounter (INDEPENDENT_AMBULATORY_CARE_PROVIDER_SITE_OTHER): Payer: Self-pay | Admitting: Surgery

## 2010-09-26 VITALS — Ht 62.0 in | Wt 237.0 lb

## 2010-09-26 DIAGNOSIS — K432 Incisional hernia without obstruction or gangrene: Secondary | ICD-10-CM

## 2010-09-26 LAB — GLUCOSE, CAPILLARY
Glucose-Capillary: 115 mg/dL — ABNORMAL HIGH (ref 70–99)
Glucose-Capillary: 153 mg/dL — ABNORMAL HIGH (ref 70–99)
Glucose-Capillary: 141 mg/dL — ABNORMAL HIGH (ref 70–99)
Glucose-Capillary: 203 mg/dL — ABNORMAL HIGH (ref 70–99)

## 2010-09-26 NOTE — Progress Notes (Signed)
Subjective:     Patient ID: [name], female   DOB: 06/17/1945, 65 y.o.   MRN: [id number]  HPI  Return visit: Status post laparoscopic lysis of adhesions and intraoperative enterotomy repair 18Jul2012. Unable to perform a ventral hernia repair laparoscopically due to the large defect  Patient comes today feeling tired, but gradually getting stronger. She is at the [facility] nursing facility. She is getting physical therapy there. She is walking with a walker. She feels tired but she is gradually improving. No fevers or sweats. No nausea or vomiting. She is tolerating p.o. She's having 2-3 bowel movements a day. She comes today with a friend who wishes to be a health care facilitator (? Health care power of attorney,).  She is debating about considering an open repair. We had seen referral to [facility]. There reviewing. Dr. Dwain Sarna in our group declined to offer open component separation repair  given her morbid obesity.  Chooses the binder. She thinks it helps. She wonders if she continue with the.  Review of Systems  Gastrointestinal: Positive for abdominal pain. Negative for nausea, vomiting, diarrhea, constipation, abdominal distention and rectal pain.  Genitourinary: Negative for dysuria, urgency, difficulty urinating and pelvic pain.  All other systems reviewed and are negative.       Objective:   Physical Exam  Constitutional: She is oriented to person, place, and time. She appears well-developed and well-nourished. No distress.  HENT:  Head: Normocephalic.  Mouth/Throat: Oropharynx is clear and moist. No oropharyngeal exudate.  Eyes: Conjunctivae and EOM are normal. Pupils are equal, round, and reactive to light. No scleral icterus.  Neck: Normal range of motion. Neck supple. No tracheal deviation present.  Cardiovascular: Normal rate, regular rhythm and intact distal pulses.   Pulmonary/Chest: Effort normal and breath sounds normal. No respiratory distress. She exhibits no  tenderness.  Abdominal: Soft. She exhibits no distension and no mass. There is no tenderness. Hernia confirmed negative in the right inguinal area and confirmed negative in the left inguinal area.       Morbidly obese. Obvious ventral hernias left upper abdomen. A large. It is soft and reducible. Infraumbilically fullness but no evidence of incarcerated hernias. Incisions are well-healed. Normal healing ridges.  Musculoskeletal: Normal range of motion.  Lymphadenopathy:    She has no cervical adenopathy.       Right: No inguinal adenopathy present.       Left: No inguinal adenopathy present.  Neurological: She is alert and oriented to person, place, and time. No cranial nerve deficit. She exhibits normal muscle tone. Coordination normal.  Skin: Skin is warm and dry. No rash noted. She is not diaphoretic. No erythema.  Psychiatric: She has a normal mood and affect. Her behavior is normal. Judgment and thought content normal.       Assessment:     3rd recurrence of ventral hernias in a morbidly obese and diabetic with numerous chronic health issues.. Loss of domain. Supraumbilical and infraumbilical hernias in numerous locations. All large defects. Soft and reducible.   Plan:     I discussed with the patient and her friend. I think is a good idea to get a second opinion and either [facility] or [facility] to consider an open component separation repair. However, given her numerous risk factors, I worry about the increased operative risk. She had a moderately dense adhesions as well.  Another reasonable option is to wear her abdominal binder, trying to lose weight and live her life. The defects are large, my  hope is that the risk of incarceration and strangulation is low.   She wishes to get stronger. She is open to the idea of consideration of an open repair. But she does not seem enthusiastic to go through the process  We have already sent paperwork to [facility] and waiting to see if they will consider  talking to her. Our group is not comfortable offering the open component separation repair at this time  Otherwise return clinic p.r.n..  Followup colonoscopy in 2015 with Dr. Arlyce Dice as already planned given her history of colon cancers in 2009 and 2011.

## 2010-09-27 LAB — GLUCOSE, CAPILLARY
Glucose-Capillary: 117 mg/dL — ABNORMAL HIGH (ref 70–99)
Glucose-Capillary: 139 mg/dL — ABNORMAL HIGH (ref 70–99)
Glucose-Capillary: 147 mg/dL — ABNORMAL HIGH (ref 70–99)

## 2010-09-28 LAB — GLUCOSE, CAPILLARY: Glucose-Capillary: 126 mg/dL — ABNORMAL HIGH (ref 70–99)

## 2010-09-29 LAB — GLUCOSE, CAPILLARY
Glucose-Capillary: 131 mg/dL — ABNORMAL HIGH (ref 70–99)
Glucose-Capillary: 111 mg/dL — ABNORMAL HIGH (ref 70–99)
Glucose-Capillary: 162 mg/dL — ABNORMAL HIGH (ref 70–99)

## 2010-09-30 LAB — GLUCOSE, CAPILLARY: Glucose-Capillary: 188 mg/dL — ABNORMAL HIGH (ref 70–99)

## 2010-11-22 LAB — CBC
HCT: 26.8 — ABNORMAL LOW
HCT: 27.5 — ABNORMAL LOW
Hemoglobin: 8.6 — ABNORMAL LOW
Hemoglobin: 9 — ABNORMAL LOW
Hemoglobin: 9.5 — ABNORMAL LOW
MCHC: 32.1
MCHC: 32.4
MCHC: 32.6
MCHC: 32.9
MCV: 69.5 — ABNORMAL LOW
MCV: 71.9 — ABNORMAL LOW
RBC: 3.76 — ABNORMAL LOW
RBC: 3.8 — ABNORMAL LOW
RBC: 3.85 — ABNORMAL LOW
RBC: 3.97
RDW: 17.1 — ABNORMAL HIGH
RDW: 19.4 — ABNORMAL HIGH
RDW: 19.4 — ABNORMAL HIGH
WBC: 7.7

## 2010-11-22 LAB — BASIC METABOLIC PANEL
CO2: 28
Calcium: 9.8
Chloride: 106
GFR calc Af Amer: >60
Glucose, Bld: 162 — ABNORMAL HIGH
Potassium: 4.6
Sodium: 138

## 2010-11-22 LAB — COMPREHENSIVE METABOLIC PANEL
ALT: 17
AST: 18
Alkaline Phosphatase: 88
CO2: 26
Calcium: 10.1
Chloride: 106
GFR calc Af Amer: 52 — ABNORMAL LOW
GFR calc non Af Amer: 43 — ABNORMAL LOW
Glucose, Bld: 187 — ABNORMAL HIGH
Sodium: 139
Total Bilirubin: 0.5

## 2010-11-22 LAB — CK TOTAL AND CKMB (NOT AT ARMC)
CK, MB: 0.9
Relative Index: INVALID

## 2010-11-22 LAB — DIFFERENTIAL
Basophils Absolute: 0
Basophils Relative: 0
Eosinophils Absolute: 0.3
Lymphocytes Relative: 17
Lymphs Abs: 1.6
Monocytes Absolute: 0.9
Neutro Abs: 6.7

## 2010-11-22 LAB — CROSSMATCH
ABO/RH(D): O POS
Antibody Screen: NEGATIVE

## 2010-11-22 LAB — IRON AND TIBC: UIBC: 389

## 2010-11-22 LAB — VITAMIN B12: Vitamin B-12: 337 (ref 211–911)

## 2010-11-22 LAB — POCT I-STAT, CHEM 8
BUN: 32 — ABNORMAL HIGH
Calcium, Ion: 1.32
Chloride: 104
Creatinine, Ser: 1.4 — ABNORMAL HIGH
Glucose, Bld: 218 — ABNORMAL HIGH
TCO2: 24

## 2010-11-22 LAB — TROPONIN I: Troponin I: 0.01

## 2010-11-22 LAB — FOLATE: Folate: >20

## 2010-11-22 LAB — POCT CARDIAC MARKERS
CKMB, poc: 1.3
Troponin i, poc: <0.05

## 2010-11-22 LAB — PROTIME-INR: Prothrombin Time: 12.5

## 2010-11-22 LAB — TSH: TSH: 3.996

## 2010-11-22 LAB — LIPID PANEL: Cholesterol: 151

## 2010-11-22 LAB — ABO/RH: ABO/RH(D): O POS

## 2010-11-22 LAB — B-NATRIURETIC PEPTIDE (CONVERTED LAB): Pro B Natriuretic peptide (BNP): 30

## 2010-11-22 LAB — RETICULOCYTES: Retic Ct Pct: 1.9

## 2010-11-26 LAB — BASIC METABOLIC PANEL
BUN: 22
Chloride: 102
Creatinine, Ser: 1.26 — ABNORMAL HIGH
GFR calc non Af Amer: 43 — ABNORMAL LOW
Glucose, Bld: 151 — ABNORMAL HIGH
Potassium: 4.3

## 2010-11-26 LAB — CBC
HCT: 26.6 — ABNORMAL LOW
MCV: 75.1 — ABNORMAL LOW
Platelets: 354
RDW: 18 — ABNORMAL HIGH

## 2010-11-26 LAB — GLUCOSE, CAPILLARY: Glucose-Capillary: 210 — ABNORMAL HIGH

## 2010-11-27 LAB — CREATININE, SERUM
Creatinine, Ser: 1.26 — ABNORMAL HIGH
Creatinine, Ser: 1.43 — ABNORMAL HIGH
GFR calc Af Amer: 52 — ABNORMAL LOW
GFR calc Af Amer: >60
GFR calc non Af Amer: 37 — ABNORMAL LOW
GFR calc non Af Amer: 43 — ABNORMAL LOW
GFR calc non Af Amer: 45 — ABNORMAL LOW

## 2010-11-27 LAB — HEMOGLOBIN AND HEMATOCRIT, BLOOD
HCT: 24.3 — ABNORMAL LOW
Hemoglobin: 7.8 — CL

## 2010-11-27 LAB — GLUCOSE, CAPILLARY
Glucose-Capillary: 104 — ABNORMAL HIGH
Glucose-Capillary: 104 — ABNORMAL HIGH
Glucose-Capillary: 111 — ABNORMAL HIGH
Glucose-Capillary: 112 — ABNORMAL HIGH
Glucose-Capillary: 115 — ABNORMAL HIGH
Glucose-Capillary: 118 — ABNORMAL HIGH
Glucose-Capillary: 125 — ABNORMAL HIGH
Glucose-Capillary: 126 — ABNORMAL HIGH
Glucose-Capillary: 129 — ABNORMAL HIGH
Glucose-Capillary: 129 — ABNORMAL HIGH
Glucose-Capillary: 131 — ABNORMAL HIGH
Glucose-Capillary: 138 — ABNORMAL HIGH
Glucose-Capillary: 142 — ABNORMAL HIGH
Glucose-Capillary: 151 — ABNORMAL HIGH
Glucose-Capillary: 172 — ABNORMAL HIGH
Glucose-Capillary: 174 — ABNORMAL HIGH
Glucose-Capillary: 182 — ABNORMAL HIGH
Glucose-Capillary: 216 — ABNORMAL HIGH
Glucose-Capillary: 32 — CL
Glucose-Capillary: 63 — ABNORMAL LOW
Glucose-Capillary: 76
Glucose-Capillary: 81
Glucose-Capillary: 93
Glucose-Capillary: 93
Glucose-Capillary: 99

## 2010-11-27 LAB — BASIC METABOLIC PANEL
BUN: 13
BUN: 15
CO2: 28
Chloride: 104
Creatinine, Ser: 1.08
Creatinine, Ser: 1.25 — ABNORMAL HIGH
GFR calc Af Amer: >60
GFR calc non Af Amer: 51 — ABNORMAL LOW
Glucose, Bld: 86
Potassium: 3.7
Potassium: 3.9

## 2010-11-27 LAB — CBC
HCT: 21.5 — ABNORMAL LOW
HCT: 25.6 — ABNORMAL LOW
HCT: 29.8 — ABNORMAL LOW
Hemoglobin: 7.1 — CL
Hemoglobin: 8.5 — ABNORMAL LOW
MCHC: 32.8
MCHC: 32.9
MCV: 77.7 — ABNORMAL LOW
MCV: 79.2
Platelets: 289
Platelets: 346
Platelets: 413 — ABNORMAL HIGH
RBC: 3.46 — ABNORMAL LOW
RBC: 3.77 — ABNORMAL LOW
RDW: 18.5 — ABNORMAL HIGH
RDW: 20 — ABNORMAL HIGH
WBC: 6.2
WBC: 6.6
WBC: 8.6
WBC: 9.5

## 2010-11-27 LAB — TYPE AND SCREEN: ABO/RH(D): O POS

## 2010-11-27 LAB — MAGNESIUM: Magnesium: 2

## 2010-11-27 LAB — POTASSIUM
Potassium: 2.9 — ABNORMAL LOW
Potassium: 3.7

## 2011-05-08 ENCOUNTER — Other Ambulatory Visit (HOSPITAL_COMMUNITY): Payer: Self-pay | Admitting: Internal Medicine

## 2011-05-08 DIAGNOSIS — I639 Cerebral infarction, unspecified: Secondary | ICD-10-CM

## 2011-05-08 DIAGNOSIS — R200 Anesthesia of skin: Secondary | ICD-10-CM

## 2011-05-08 DIAGNOSIS — R531 Weakness: Secondary | ICD-10-CM

## 2011-05-10 ENCOUNTER — Ambulatory Visit (HOSPITAL_COMMUNITY)
Admission: RE | Admit: 2011-05-10 | Discharge: 2011-05-10 | Disposition: A | Discharge status: Home / Self Care | Payer: Medicare Other | Source: Ambulatory Visit | Attending: Internal Medicine | Admitting: Internal Medicine

## 2011-05-10 DIAGNOSIS — R209 Unspecified disturbances of skin sensation: Secondary | ICD-10-CM | POA: Insufficient documentation

## 2011-05-10 DIAGNOSIS — R200 Anesthesia of skin: Secondary | ICD-10-CM

## 2011-05-10 DIAGNOSIS — R531 Weakness: Secondary | ICD-10-CM

## 2011-05-10 DIAGNOSIS — R42 Dizziness and giddiness: Secondary | ICD-10-CM | POA: Insufficient documentation

## 2011-05-10 DIAGNOSIS — I639 Cerebral infarction, unspecified: Secondary | ICD-10-CM

## 2011-05-10 DIAGNOSIS — R4789 Other speech disturbances: Secondary | ICD-10-CM | POA: Insufficient documentation

## 2011-05-27 ENCOUNTER — Ambulatory Visit: Payer: No Typology Code available for payment source | Admitting: Cardiovascular Disease

## 2011-06-27 ENCOUNTER — Ambulatory Visit: Payer: Self-pay | Admitting: Cardiovascular Disease

## 2011-07-11 ENCOUNTER — Encounter: Payer: Medicare Other | Admitting: Hematology and Oncology

## 2011-08-21 ENCOUNTER — Encounter: Payer: Medicare Other | Admitting: Hematology and Oncology

## 2011-08-21 DIAGNOSIS — C189 Malignant neoplasm of colon, unspecified: Secondary | ICD-10-CM

## 2011-08-21 DIAGNOSIS — Z452 Encounter for adjustment and management of vascular access device: Secondary | ICD-10-CM

## 2011-09-17 ENCOUNTER — Encounter: Payer: Medicare Other | Admitting: Hematology and Oncology

## 2011-09-17 DIAGNOSIS — E785 Hyperlipidemia, unspecified: Secondary | ICD-10-CM

## 2011-09-17 DIAGNOSIS — E1142 Type 2 diabetes mellitus with diabetic polyneuropathy: Secondary | ICD-10-CM

## 2011-09-17 DIAGNOSIS — C180 Malignant neoplasm of cecum: Secondary | ICD-10-CM

## 2011-09-17 DIAGNOSIS — E1149 Type 2 diabetes mellitus with other diabetic neurological complication: Secondary | ICD-10-CM

## 2011-10-01 ENCOUNTER — Encounter (HOSPITAL_COMMUNITY): Payer: Self-pay | Admitting: Pharmacy Technician

## 2011-10-02 ENCOUNTER — Encounter: Payer: Medicare Other | Admitting: Hematology and Oncology

## 2011-10-03 ENCOUNTER — Encounter (HOSPITAL_COMMUNITY): Payer: Self-pay

## 2011-10-03 ENCOUNTER — Encounter (HOSPITAL_COMMUNITY)
Admission: RE | Admit: 2011-10-03 | Discharge: 2011-10-03 | Disposition: A | Discharge status: Home / Self Care | Payer: Medicare Other | Source: Ambulatory Visit | Attending: Orthopaedic Surgery | Admitting: Orthopaedic Surgery

## 2011-10-03 HISTORY — DX: Benign neoplasm of cranial nerves: D33.3

## 2011-10-03 LAB — URINE MICROSCOPIC-ADD ON

## 2011-10-03 LAB — URINALYSIS, ROUTINE W REFLEX MICROSCOPIC
Glucose, UA: 100 mg/dL — AB
Specific Gravity, Urine: >1.03 — ABNORMAL HIGH (ref 1.005–1.030)
pH: 5.5 (ref 5.0–8.0)

## 2011-10-03 NOTE — Patient Instructions (Signed)
20 [name]  10/03/2011   Your procedure is scheduled on:  10/08/11  Report to [facility] at 06:15 AM.  Call this number if you have problems the morning of surgery: [phone]   Remember:   Do not eat food:After Midnight.  May have clear liquids:until Midnight .  Clear liquids include soda, tea, black coffee, apple or grape juice, broth.  Take these medicines the morning of surgery with A SIP OF WATER: Atenolol, Cozaar, and Prozac.   Do not wear jewelry, make-up or nail polish.  Do not wear lotions, powders, or perfumes. You may wear deodorant.  Do not shave 48 hours prior to surgery. Men may shave face and neck.  Do not bring valuables to the hospital.  Contacts, dentures or bridgework may not be worn into surgery.  Leave suitcase in the car. After surgery it may be brought to your room.  For patients admitted to the hospital, checkout time is 11:00 AM the day of discharge.   Patients discharged the day of surgery will not be allowed to drive home.  Special Instructions: CHG Shower Use Special Wash: 1/2 bottle night before surgery and 1/2 bottle morning of surgery.   Please read over the following fact sheets that you were given: Pain Booklet, MRSA Information, Surgical Site Infection Prevention, Anesthesia Post-op Instructions and Care and Recovery After Surgery    PATIENT INSTRUCTIONS POST-ANESTHESIA  IMMEDIATELY FOLLOWING SURGERY:  Do not drive or operate machinery for the first twenty four hours after surgery.  Do not make any important decisions for twenty four hours after surgery or while taking narcotic pain medications or sedatives.  If you develop intractable nausea and vomiting or a severe headache please notify your doctor immediately.  FOLLOW-UP:  Please make an appointment with your surgeon as instructed. You do not need to follow up with anesthesia unless specifically instructed to do so.  WOUND CARE INSTRUCTIONS (if applicable):  Keep a dry clean dressing on the  anesthesia/puncture wound site if there is drainage.  Once the wound has quit draining you may leave it open to air.  Generally you should leave the bandage intact for twenty four hours unless there is drainage.  If the epidural site drains for more than 36-48 hours please call the anesthesia department.  QUESTIONS?:  Please feel free to call your physician or the hospital operator if you have any questions, and they will be happy to assist you.

## 2011-10-07 NOTE — H&P (Signed)
[name] is an 66 y.o. female.   Chief Complaint: Pain right long finger from small little cyst HPI: She has had pain in the right long finger volar side from a very small cyst or lesion about the size of a grape seed.  She has pain when gripping an object.  She was to have had it removed last Monday, August 5, at my office.  However, she could not tolerate the tourniquet in the trial usage before we began.  I have asked to have this procedure done in the ER where appropriate medicines for sedation can be given.  I have gone over the risks and imponderables of the procedure.  She appears to understand.  Past Medical History  Diagnosis Date  . Coronary artery disease     s/p stent to lad 2006 w subsequent kissing balloon pci to diagona, pci of the lad w a drug-eluting stent July 2010  . Ejection fraction < 50%     (is preserved)  . Diabetes mellitus   . Hypertension   . Dyslipidemia   . Morbid obesity   . Fibromyalgia   . GERD (gastroesophageal reflux disease)   . Varicose vein   . Gastroparesis   . Depressed   . Anxiety   . Hiatal hernia   . Osteoarthritis   . Hyperlipidemia   . SOB (shortness of breath)   . Bronchitis   . Blurred vision   . Sinus problem   . Adenocarcinoma     ascending colon, LNs pos -- S/P right hemicolectomy, xeloda completed 6/10 (dr. Cleone Slim)  . Acoustic neuroma     left ear    Past Surgical History  Procedure Date  . Cataract extraction   . Fetal surgery for congenital hernia   . Knee arthroscopy   . Replacement total knee   . Tonsillectomy   . Hemicolectomy 12/28/07    right for adenocarcinoma of ascending colon  . Hysterotomy 1993  . Herniaorraphy 01/22/06    for recurrent ventral hernia  . Laparotomy     for small bowl obstruction  . Resecton of acoustic neuroma   . Stent surgery   . Abdominal hysterectomy 1993  . Cholecystectomy 1966  . Joint replacement 2005 and 2006    right 2005, left 2006  . Eye surgery     cataract on right  2005, left 2008  . Hernia repair 1990, 2002, 2007    open x1, lap x2   . Colon surgery   . Portacath placement   . Excision morton's neuroma   . Back surgery   . Appendectomy   . Cardiac catheterization     Family History  Problem Relation Age of Onset  . Coronary artery disease Other     family history  . Asthma Other     family history-grandfather  . Colon cancer Other     family hx  . Diabetes Maternal Grandmother     unspecif if Maternal or Paternal  . Heart failure Mother   . Cirrhosis Father    Social History:  reports that she has never smoked. She does not have any smokeless tobacco history on file. She reports that she does not drink alcohol or use illicit drugs.  Allergies:  Allergies  Allergen Reactions  . Morphine Shortness Of Breath, Itching and Other (See Comments)    Makes patient feel as if her "insides" are on fire.  . Latex Itching    Only where touched on body with it.  Marland Kitchen  Iohexol      Code: RASH, Desc: pt states she broke out in a rash from iv contrast in 1993.  cannot remember any details....pt had contrast in 2003 and was not premedicated.  We did her w/o meds today and she did fine. Thanks, Dorma Russell, Onset Date: [id number]   . Codeine Itching and Rash    All over body.  . Penicillins Itching and Rash    Localized.    No prescriptions prior to admission    No results found for this or any previous visit (from the past 48 hour(s)). No results found.  Review of Systems  Constitutional: Negative.   HENT: Negative.   Eyes: Negative.   Respiratory: Negative.   Cardiovascular:       History of heart disease, history of heart failure.  Gastrointestinal:       History of colon cancer and surgery.  Musculoskeletal: Positive for myalgias (History of fibromalgia) and joint pain (Pain long finger right mid finger from small cyst).  Skin: Negative.   Neurological: Negative.   Endo/Heme/Allergies: Negative.   Psychiatric/Behavioral: Negative.     There  were no vitals taken for this visit. Physical Exam  Constitutional: She is oriented to person, place, and time. She appears well-developed and well-nourished.  HENT:  Head: Normocephalic and atraumatic.  Eyes: Conjunctivae and EOM are normal. Pupils are equal, round, and reactive to light.  Neck: Normal range of motion. Neck supple.  Cardiovascular: Normal rate, regular rhythm, normal heart sounds and intact distal pulses.   Respiratory: Effort normal and breath sounds normal.  GI: Soft. Bowel sounds are normal.  Musculoskeletal: Normal range of motion. She exhibits tenderness (Left long finger has a very small lesion of the middle phalanx area about size of a large grape seed that is very tender.  It is not red.  It is slightly moveable.).       Hands: Neurological: She is alert and oriented to person, place, and time. She has normal reflexes.  Skin: Skin is warm and dry.  Psychiatric: She has a normal mood and affect. Judgment normal.     Assessment/Plan Small lesion of the right long finger volar side    Devonn Giampietro 10/07/2011, 2:33 PM

## 2011-10-08 ENCOUNTER — Encounter (HOSPITAL_COMMUNITY): Payer: Self-pay | Admitting: *Deleted

## 2011-10-08 ENCOUNTER — Encounter (HOSPITAL_COMMUNITY): Payer: Self-pay | Admitting: Anesthesiology

## 2011-10-08 ENCOUNTER — Other Ambulatory Visit: Payer: Self-pay

## 2011-10-08 ENCOUNTER — Encounter (HOSPITAL_COMMUNITY)
Admission: RE | Disposition: A | Discharge status: Home / Self Care | Payer: Self-pay | Source: Ambulatory Visit | Attending: Orthopaedic Surgery

## 2011-10-08 ENCOUNTER — Ambulatory Visit (HOSPITAL_COMMUNITY): Payer: Medicare Other | Admitting: Anesthesiology

## 2011-10-08 ENCOUNTER — Ambulatory Visit (HOSPITAL_COMMUNITY)
Admission: RE | Admit: 2011-10-08 | Discharge: 2011-10-08 | Disposition: A | Discharge status: Home / Self Care | Payer: Medicare Other | Source: Ambulatory Visit | Attending: Orthopaedic Surgery | Admitting: Orthopaedic Surgery

## 2011-10-08 DIAGNOSIS — D2110 Benign neoplasm of connective and other soft tissue of unspecified upper limb, including shoulder: Secondary | ICD-10-CM | POA: Insufficient documentation

## 2011-10-08 DIAGNOSIS — Z79899 Other long term (current) drug therapy: Secondary | ICD-10-CM | POA: Insufficient documentation

## 2011-10-08 DIAGNOSIS — I10 Essential (primary) hypertension: Secondary | ICD-10-CM | POA: Insufficient documentation

## 2011-10-08 DIAGNOSIS — Z01812 Encounter for preprocedural laboratory examination: Secondary | ICD-10-CM | POA: Insufficient documentation

## 2011-10-08 DIAGNOSIS — E119 Type 2 diabetes mellitus without complications: Secondary | ICD-10-CM | POA: Insufficient documentation

## 2011-10-08 DIAGNOSIS — E785 Hyperlipidemia, unspecified: Secondary | ICD-10-CM | POA: Insufficient documentation

## 2011-10-08 DIAGNOSIS — Z794 Long term (current) use of insulin: Secondary | ICD-10-CM | POA: Insufficient documentation

## 2011-10-08 HISTORY — PX: LESION REMOVAL: SHX5196

## 2011-10-08 LAB — GLUCOSE, CAPILLARY: Glucose-Capillary: 125 mg/dL — ABNORMAL HIGH (ref 70–99)

## 2011-10-08 SURGERY — WIDE EXCISION, LESION, UPPER EXTREMITY
Anesthesia: Monitor Anesthesia Care | Site: Finger | Laterality: Right | Wound class: Clean

## 2011-10-08 MED ORDER — FENTANYL CITRATE 0.05 MG/ML IJ SOLN: 25.0000 ug | INTRAMUSCULAR | Status: DC | PRN

## 2011-10-08 MED ORDER — LACTATED RINGERS IV SOLN
INTRAVENOUS | Status: DC | PRN
  Administered 2011-10-08: 07:00:00 via INTRAVENOUS

## 2011-10-08 MED ORDER — 0.9 % SODIUM CHLORIDE (POUR BTL) OPTIME
TOPICAL | Status: DC | PRN
  Administered 2011-10-08: 1000 mL

## 2011-10-08 MED ORDER — TRAMADOL HCL 50 MG PO TABS: 50.0000 mg | ORAL_TABLET | Freq: Four times a day (QID) | ORAL | Status: AC | PRN

## 2011-10-08 MED ORDER — LACTATED RINGERS IV SOLN
INTRAVENOUS | Status: DC
  Administered 2011-10-08: 1000 mL via INTRAVENOUS

## 2011-10-08 MED ORDER — MIDAZOLAM HCL 2 MG/2ML IJ SOLN
INTRAMUSCULAR | Status: AC
  Filled 2011-10-08: qty 2

## 2011-10-08 MED ORDER — FENTANYL CITRATE 0.05 MG/ML IJ SOLN
INTRAMUSCULAR | Status: AC
  Filled 2011-10-08: qty 2

## 2011-10-08 MED ORDER — MIDAZOLAM HCL 2 MG/2ML IJ SOLN
1.0000 mg | INTRAMUSCULAR | Status: DC | PRN
  Administered 2011-10-08: 2 mg via INTRAVENOUS

## 2011-10-08 MED ORDER — LIDOCAINE HCL (PF) 0.5 % IJ SOLN
INTRAMUSCULAR | Status: AC
  Filled 2011-10-08: qty 50

## 2011-10-08 MED ORDER — PROPOFOL 10 MG/ML IV EMUL
INTRAVENOUS | Status: AC
  Filled 2011-10-08: qty 20

## 2011-10-08 MED ORDER — LIDOCAINE HCL (CARDIAC) 10 MG/ML IV SOLN
INTRAVENOUS | Status: DC | PRN
  Administered 2011-10-08: 50 mg via INTRAVENOUS

## 2011-10-08 MED ORDER — FENTANYL CITRATE 0.05 MG/ML IJ SOLN
INTRAMUSCULAR | Status: DC | PRN
  Administered 2011-10-08 (×4): 25 ug via INTRAVENOUS

## 2011-10-08 MED ORDER — ONDANSETRON HCL 4 MG/2ML IJ SOLN: 4.0000 mg | Freq: Once | INTRAMUSCULAR | Status: DC | PRN

## 2011-10-08 MED ORDER — LIDOCAINE HCL (PF) 1 % IJ SOLN
INTRAMUSCULAR | Status: AC
  Filled 2011-10-08: qty 5

## 2011-10-08 MED ORDER — PROPOFOL 10 MG/ML IV EMUL
INTRAVENOUS | Status: DC | PRN
  Administered 2011-10-08: 75 ug/kg/min via INTRAVENOUS

## 2011-10-08 SURGICAL SUPPLY — 41 items
BAG HAMPER (MISCELLANEOUS) ×2 IMPLANT
BANDAGE ELASTIC 3 VELCRO NS (GAUZE/BANDAGES/DRESSINGS) IMPLANT
BANDAGE ESMARK 4X12 BL STRL LF (DISPOSABLE) ×1 IMPLANT
BLADE SURG 15 STRL LF DISP TIS (BLADE) ×1 IMPLANT
BLADE SURG 15 STRL SS (BLADE) ×2
BNDG CMPR 12X4 ELC STRL LF (DISPOSABLE)
BNDG ESMARK 4X12 BLUE STRL LF (DISPOSABLE)
CLOTH BEACON ORANGE TIMEOUT ST (SAFETY) ×2 IMPLANT
COVER LIGHT HANDLE STERIS (MISCELLANEOUS) ×4 IMPLANT
CUFF TOURNIQUET SINGLE 18IN (TOURNIQUET CUFF) ×2 IMPLANT
CUFF TOURNIQUET SINGLE 24IN (TOURNIQUET CUFF) IMPLANT
DRSG XEROFORM 1X8 (GAUZE/BANDAGES/DRESSINGS) ×2 IMPLANT
DURAPREP 26ML APPLICATOR (WOUND CARE) ×2 IMPLANT
ELECT NDL TIP 2.8 STRL (NEEDLE) IMPLANT
ELECT NEEDLE TIP 2.8 STRL (NEEDLE) ×2 IMPLANT
ELECT REM PT RETURN 9FT ADLT (ELECTROSURGICAL) ×2
ELECTRODE REM PT RTRN 9FT ADLT (ELECTROSURGICAL) ×1 IMPLANT
FORMALIN 10 PREFIL 120ML (MISCELLANEOUS) ×2 IMPLANT
GLOVE BIO SURGEON STRL SZ8 (GLOVE) ×2 IMPLANT
GLOVE BIO SURGEON STRL SZ8.5 (GLOVE) ×2 IMPLANT
GLOVE BIOGEL PI IND STRL 7.0 (GLOVE) IMPLANT
GLOVE BIOGEL PI IND STRL 8.5 (GLOVE) IMPLANT
GLOVE BIOGEL PI INDICATOR 7.0 (GLOVE) ×1
GLOVE BIOGEL PI INDICATOR 8.5 (GLOVE) ×1
GLOVE SKINSENSE NS SZ6.5 (GLOVE) ×1
GLOVE SKINSENSE NS SZ8.0 LF (GLOVE) ×1
GLOVE SKINSENSE STRL SZ6.5 (GLOVE) IMPLANT
GLOVE SKINSENSE STRL SZ8.0 LF (GLOVE) IMPLANT
GOWN STRL REIN XL XLG (GOWN DISPOSABLE) ×4 IMPLANT
KIT ROOM TURNOVER APOR (KITS) ×2 IMPLANT
NS IRRIG 1000ML POUR BTL (IV SOLUTION) ×2 IMPLANT
PACK BASIC LIMB (CUSTOM PROCEDURE TRAY) ×2 IMPLANT
PAD ARMBOARD 7.5X6 YLW CONV (MISCELLANEOUS) ×2 IMPLANT
PAD CAST 3X4 CTTN HI CHSV (CAST SUPPLIES) IMPLANT
PADDING CAST COTTON 3X4 STRL (CAST SUPPLIES)
SET BASIN LINEN APH (SET/KITS/TRAYS/PACK) ×2 IMPLANT
SPONGE GAUZE 2X2 8PLY STRL LF (GAUZE/BANDAGES/DRESSINGS) ×6 IMPLANT
SPONGE GAUZE 4X4 12PLY (GAUZE/BANDAGES/DRESSINGS) ×2 IMPLANT
SUT ETHILON 3 0 FSL (SUTURE) ×2 IMPLANT
TOWEL OR 17X26 4PK STRL BLUE (TOWEL DISPOSABLE) ×1 IMPLANT
TUBEGAUZ 1" ×1 IMPLANT

## 2011-10-08 NOTE — Anesthesia Preprocedure Evaluation (Signed)
Anesthesia Evaluation  Patient identified by MRN, date of birth, ID band Patient awake    Reviewed: Allergy & Precautions, H&P , NPO status , Patient's Chart, lab work & pertinent test results  Airway Mallampati: II TM Distance: >3 FB     Dental  (+) Teeth Intact and Implants   Pulmonary shortness of breath,  breath sounds clear to auscultation        Cardiovascular hypertension, + CAD and + Cardiac Stents Rhythm:Regular     Neuro/Psych PSYCHIATRIC DISORDERS Anxiety Depression    GI/Hepatic hiatal hernia,   Endo/Other  Poorly Controlled, Type 2, Insulin DependentMorbid obesity  Renal/GU      Musculoskeletal  (+) Fibromyalgia -  Abdominal   Peds  Hematology   Anesthesia Other Findings   Reproductive/Obstetrics                           Anesthesia Physical Anesthesia Plan  ASA: III  Anesthesia Plan: MAC and Bier Block   Post-op Pain Management:    Induction:   Airway Management Planned: Nasal Cannula  Additional Equipment:   Intra-op Plan:   Post-operative Plan:   Informed Consent: I have reviewed the patients History and Physical, chart, labs and discussed the procedure including the risks, benefits and alternatives for the proposed anesthesia with the patient or authorized representative who has indicated his/her understanding and acceptance.     Plan Discussed with:   Anesthesia Plan Comments:         Anesthesia Quick Evaluation

## 2011-10-08 NOTE — Anesthesia Procedure Notes (Signed)
Anesthesia Regional Block:  Bier block (IV Regional)  Pre-Anesthetic Checklist: ,, timeout performed, Correct Patient, Correct Site, Correct Laterality, Correct Procedure,, site marked, surgical consent,, at surgeon's request  Laterality: Right     Needles:  Injection technique: Single-shot  Needle Type: Other   (20 ga IV cath)    Needle Gauge: 20 and 20 G    Additional Needles: Bier block (IV Regional)  Nerve Stimulator or Paresthesia:   Additional Responses:  Pulse checked post tourniquet inflation. IV NSL discontinued post injection. Narrative:  Start time: 10/08/2011 7:39 AM  Performed by: Personally   Additional Notes: .5% Lidocaine 45cc injected; Patient tolerated well.

## 2011-10-08 NOTE — Brief Op Note (Signed)
10/08/2011  8:05 AM  PATIENT:  Julieanne Manson Bulnes  66 y.o. 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, pathology pending  PROCEDURE:  Procedure(s) (LRB): LESION REMOVAL (Right)  SURGEON:  Surgeon(s) and Role:    * Darreld Mclean, MD - Primary  PHYSICIAN ASSISTANT:   ASSISTANTS: none   ANESTHESIA:   regional  EBL:  Total I/O In: 200 [I.V.:200] Out: -   BLOOD ADMINISTERED:none  DRAINS: none   LOCAL MEDICATIONS USED:  NONE  SPECIMEN:  Source of Specimen:  right long finger radial side middle phalanx area  DISPOSITION OF SPECIMEN:  PATHOLOGY  COUNTS:  YES  TOURNIQUET:   Total Tourniquet Time Documented: Upper Arm (Right) - 26 minutes  DICTATION: .Other Dictation: Dictation Number [id number]  PLAN OF CARE: Discharge to home after PACU  PATIENT DISPOSITION:  PACU - hemodynamically stable.   Delay start of Pharmacological VTE agent (>24hrs) due to surgical blood loss or risk of bleeding: not applicable

## 2011-10-08 NOTE — Preoperative (Signed)
Beta Blockers   Reason not to administer Beta Blockers:Not Applicable 

## 2011-10-08 NOTE — Progress Notes (Signed)
The History and Physical is unchanged. I have examined the patient. The patient is medically able to have surgery on the right long finger . Darreld Mclean

## 2011-10-08 NOTE — Transfer of Care (Signed)
Immediate Anesthesia Transfer of Care Note  Patient: [name]  Procedure(s) Performed: Procedure(s) (LRB): LESION REMOVAL (Right)  Patient Location: PACU  Anesthesia Type: Bier block  Level of Consciousness: awake, alert , oriented and patient cooperative  Airway & Oxygen Therapy: Patient Spontanous Breathing  Post-op Assessment: Report given to PACU RN and Post -op Vital signs reviewed and stable  Post vital signs: Reviewed and stable  Complications: No apparent anesthesia complications

## 2011-10-08 NOTE — Anesthesia Postprocedure Evaluation (Signed)
  Anesthesia Post-op Note  Patient: [name]  Procedure(s) Performed: Procedure(s) (LRB): LESION REMOVAL (Right)  Patient Location: PACU  Anesthesia Type: Bier block  Level of Consciousness: awake, alert , oriented and patient cooperative  Airway and Oxygen Therapy: Patient Spontanous Breathing  Post-op Pain: none  Post-op Assessment: Post-op Vital signs reviewed, Patient's Cardiovascular Status Stable, Respiratory Function Stable, Patent Airway and No signs of Nausea or vomiting  Post-op Vital Signs: Reviewed and stable  Complications: No apparent anesthesia complications

## 2011-10-09 NOTE — Op Note (Signed)
[name], [name]                ACCOUNT NO.:  [id number]  MEDICAL RECORD NO.:  [id number]  LOCATION:  [facility]                          FACILITY:  [facility]  PHYSICIAN:  J. Darreld Mclean, M.D. DATE OF BIRTH:  [birth year]  DATE OF PROCEDURE: DATE OF DISCHARGE:  10/08/2011                              OPERATIVE REPORT   PREOPERATIVE DIAGNOSIS:  Lesion of right long finger, radial side around the middle phalanx area.  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  PATHOLOGY:  Pending.  PROCEDURE:  Excision of lesion on right long finger, radial side.  ANESTHESIA:  Regional.  TOURNIQUET TIME:  Used per anesthesia record for the Bier block time.  INDICATIONS:  The patient has had problem with pain and tenderness over her right hand, radial side of the right dominant long finger.  It has been painful and tender.  It has been present for several months, it is getting worse, it is not getting any better, grip something, it hurts. She would like excision, I explained to her that this possibility could be a lesion off of the nerve or adjacent to the nerve and after surgery, she could end up with numbness of the tip of the finger on the side of the finger distal to where the lesion is.  She is aware of this.  I have also explained to her that it is not a neurilemma with no type of lesion, it could reoccur over time, it could be a small ganglion, she understands that as well.  I initially repaired to this in the office, but which she could not tolerate the tourniquet in the office or scheduled here in the operating room.  DESCRIPTION OF PROCEDURE:  The patient was seen in the holding area, identified the right long finger as correct surgical site.  I placed a mark on the right long finger.  She was brought to the operating room, given Bier block anesthesia while supine.  She was then prepped and draped in usual  manner.  A generalized time-out identifying the patient as [name] in her right long finger for excision of lesion.  The operating room team was present and everyone in the room knew each other.  The instrumentation was properly positioned and working.  A small incision was made directly over the lesion with careful dissection of the lesion was exposed, appeared to be a lesion running to the neurovascular bundle area and appeared come off of the digital nerve.  I excised the lesion in part of the nerve and suspected no apparent problems.  Wound was then reapproximated using 3-0 nylon interrupted vertical mattress manner. Sterile dressing was applied.  Bulky dressing was applied, and tube gauze dressing applied.  Tourniquet was deflated in usual fashion.  The patient will be given appropriate analgesia.  I will see her in the office on Friday for dressing change and then, I will see her in the office approximately a week to 10 days later for removal of the sutures.  Hopefully about Friday, we will have a pathology report.  If any difficulties, contact me through the office hospital  beeper system.          ______________________________ [name]. Darreld Mclean, M.D.     JWK/MEDQ  D:  10/08/2011  T:  10/09/2011  Job:  [id number]

## 2011-10-11 ENCOUNTER — Encounter (HOSPITAL_COMMUNITY): Payer: Self-pay | Admitting: Orthopaedic Surgery

## 2011-11-07 ENCOUNTER — Other Ambulatory Visit: Payer: Self-pay | Admitting: Cardiovascular Disease

## 2011-11-07 ENCOUNTER — Encounter: Payer: Self-pay | Admitting: Cardiovascular Disease

## 2011-11-07 ENCOUNTER — Ambulatory Visit (INDEPENDENT_AMBULATORY_CARE_PROVIDER_SITE_OTHER): Payer: Medicare Other | Admitting: Cardiovascular Disease

## 2011-11-07 VITALS — BP 188/86 | HR 65 | Ht 66.0 in | Wt 254.4 lb

## 2011-11-07 DIAGNOSIS — I2510 Atherosclerotic heart disease of native coronary artery without angina pectoris: Secondary | ICD-10-CM

## 2011-11-07 DIAGNOSIS — R079 Chest pain, unspecified: Secondary | ICD-10-CM

## 2011-11-07 LAB — CBC WITH DIFFERENTIAL/PLATELET
Eosinophils Relative: 2.4 % (ref 0.0–5.0)
HCT: 38.5 % (ref 36.0–46.0)
Hemoglobin: 12.8 g/dL (ref 12.0–15.0)
Lymphs Abs: 1.6 10*3/uL (ref 0.7–4.0)
MCV: 93.4 fl (ref 78.0–100.0)
Monocytes Absolute: 0.4 10*3/uL (ref 0.1–1.0)
Neutro Abs: 4.1 10*3/uL (ref 1.4–7.7)
Platelets: 209 10*3/uL (ref 150.0–400.0)
RDW: 14 % (ref 11.5–14.6)
WBC: 6.3 10*3/uL (ref 4.5–10.5)

## 2011-11-07 MED ORDER — ISOSORBIDE MONONITRATE ER 30 MG PO TB24: 30.0000 mg | ORAL_TABLET | Freq: Every day | ORAL | Status: DC

## 2011-11-07 MED ORDER — METOPROLOL TARTRATE 50 MG PO TABS: 50.0000 mg | ORAL_TABLET | Freq: Two times a day (BID) | ORAL | Status: DC

## 2011-11-07 NOTE — Progress Notes (Signed)
 HPI:  66 year-old woman presenting for followup evaluation. She has coronary artery disease and has undergone previous PCI procedures. Her last stenting procedure involved treatment of the proximal LAD. The patient has had colon cancer and has had to undergo abdominal surgery since her last coronary intervention she had no perioperative cardiac problems.  Unfortunately, she has developed recurrent symptoms of chest pain and shortness of breath. She has sharp, pins and needles type of pain in the chest at nighttime. However, she also complains of neck and left arm pain it is more like an aching pain that occurs periodically. She is much more short of breath with activity. She denies orthopnea or PND. Her symptoms have progressed over the last 6 months. She has no central chest pressure and she specifically denies chest pressure with exertion.  Outpatient Encounter Prescriptions as of 11/07/2011  Medication Sig Dispense Refill  . aspirin EC 81 MG tablet Take 81 mg by mouth daily.      . FLUoxetine (PROZAC) 20 MG capsule Take 20 mg by mouth 3 (three) times daily.      . furosemide (LASIX) 20 MG tablet Take 20 mg by mouth as needed. For fluid      . insulin glargine (LANTUS) 100 UNIT/ML injection Inject 40 Units into the skin 2 (two) times daily.       . Insulin Lispro, Human, (HUMALOG PEN [place]) Inject 15 Units into the skin 2 (two) times daily.       . losartan (COZAAR) 50 MG tablet Take 50 mg by mouth daily.      . metFORMIN (GLUCOPHAGE) 500 MG tablet Take 1,000 mg by mouth 2 (two) times daily.      . methylcellulose (ARTIFICIAL TEARS) 1 % ophthalmic solution Place 1 drop into both eyes as needed. For dry eyes      . Multiple Vitamin (MULTIVITAMIN WITH MINERALS) TABS Take 1 tablet by mouth daily.      . naproxen sodium (ANAPROX) 220 MG tablet Take 220 mg by mouth every 8 (eight) hours as needed. For pain      . nitroGLYCERIN (NITROSTAT) 0.4 MG SL tablet Place 0.4 mg under the tongue every 5 (five)  minutes as needed. As needed for chest pain. May repeat times 3        . pravastatin (PRAVACHOL) 20 MG tablet Take 20 mg by mouth daily.      . trolamine salicylate (ASPERCREME) 10 % cream Apply 1 application topically as needed. For pain in knees      . DISCONTD: atenolol (TENORMIN) 50 MG tablet Take 50 mg by mouth daily.        . isosorbide mononitrate (IMDUR) 30 MG 24 hr tablet Take 1 tablet (30 mg total) by mouth daily.  30 tablet  3  . metoprolol (LOPRESSOR) 50 MG tablet Take 1 tablet (50 mg total) by mouth 2 (two) times daily.  60 tablet  3  . DISCONTD: Polyethylene Glycol 3350 (MIRALAX PO) Take 17 g by mouth as needed. For constipation      . DISCONTD: rosuvastatin (CRESTOR) 10 MG tablet Take 10 mg by mouth daily.          Allergies  Allergen Reactions  . Morphine Shortness Of Breath, Itching and Other (See Comments)    Makes patient feel as if her "insides" are on fire.  . Latex Itching    Only where touched on body with it.  . Iohexol      Code: RASH, Desc: pt   states she broke out in a rash from iv contrast in 1993.  cannot remember any details....pt had contrast in 2003 and was not premedicated.  We did her w/o meds today and she did fine. Thanks, JB, Onset Date: [id number]   . Codeine Itching and Rash    All over body.  . Penicillins Itching and Rash    Localized.    Past Medical History  Diagnosis Date  . Coronary artery disease     s/p stent to lad 2006 w subsequent kissing balloon pci to diagona, pci of the lad w a drug-eluting stent July 2010  . Ejection fraction < 50%     (is preserved)  . Diabetes mellitus   . Hypertension   . Dyslipidemia   . Morbid obesity   . Fibromyalgia   . GERD (gastroesophageal reflux disease)   . Varicose vein   . Gastroparesis   . Depressed   . Anxiety   . Hiatal hernia   . Osteoarthritis   . Hyperlipidemia   . SOB (shortness of breath)   . Bronchitis   . Blurred vision   . Sinus problem   . Adenocarcinoma     ascending colon,  LNs pos -- S/P right hemicolectomy, xeloda completed 6/10 (dr. Karb)  . Acoustic neuroma     left ear    ROS: Negative except as per HPI  BP 188/86  Pulse 65  Ht 5' 6" (1.676 m)  Wt 115.395 kg (254 lb 6.4 oz)  BMI 41.06 kg/m2  PHYSICAL EXAM: Pt is alert and oriented, pleasant, obese female in NAD HEENT: normal Neck: JVP - normal, carotids 2+= without bruits Lungs: CTA bilaterally CV: RRR without murmur or gallop Abd: soft, NT, Positive BS, no hepatomegaly Ext: 1+ bilateral ankle and pretibial edema, distal pulses intact and equal Skin: warm/dry no rash  EKG:  Normal sinus rhythm 65 beats per minute, within normal limits.  ASSESSMENT AND PLAN: 1. Coronary atherosclerosis, native vessel with CCS class III angina. The patient also has developed significant dyspnea with exertion, concerning for a secondary symptom of angina. I have recommended escalating her medical program as follows: Change beta blocker to metoprolol 50 mg twice daily and start isosorbide mononitrate 30 mg daily. We will proceed with cardiac catheterization and possible PCI, especially considering this patient has had previous stenting of the proximal LAD and she is at risk for having a large area of ischemia based on his coronary anatomy. I have reviewed the risks, indications, and alternatives to cardiac catheterization and PCI. She understands and agrees to proceed.  2. Essential hypertension, uncontrolled. Changes as noted above with increase in her beta blocker and addition of a long-acting nitrate. She should continue on her other antihypertensive medications without change.  3. Hyperlipidemia. The patient is on a statin drug with pravastatin 20 mg. She follows with her primary care physician.  Plans as above for outpatient cardiac catheterization and possible PCI. Further followup pending the results of the cath.  Nykia Turko 11/07/2011 5:51 PM      

## 2011-11-07 NOTE — Patient Instructions (Signed)
Your physician has requested that you have a cardiac catheterization. Cardiac catheterization is used to diagnose and/or treat various heart conditions. Doctors may recommend this procedure for a number of different reasons. The most common reason is to evaluate chest pain. Chest pain can be a symptom of coronary artery disease (CAD), and cardiac catheterization can show whether plaque is narrowing or blocking your heart's arteries. This procedure is also used to evaluate the valves, as well as measure the blood flow and oxygen levels in different parts of your heart. For further information please visit [email or web address] Please follow instruction sheet, as given.  Your physician recommends that you schedule a follow-up appointment in: 1 MONTH with Dr Excell Seltzer  Your physician has recommended you make the following change in your medication: STOP Atenolol, START Metoprolol Tartrate 50mg  take one by mouth twice a day, START Isosorbide MN 30mg  take one by mouth daily

## 2011-11-08 ENCOUNTER — Encounter (HOSPITAL_COMMUNITY): Payer: Self-pay | Admitting: Pharmacy Technician

## 2011-11-08 LAB — BASIC METABOLIC PANEL
CO2: 20 mEq/L (ref 19–32)
Calcium: 9.4 mg/dL (ref 8.4–10.5)
Chloride: 104 mEq/L (ref 96–112)
Creatinine, Ser: 0.9 mg/dL (ref 0.4–1.2)
Glucose, Bld: 269 mg/dL — ABNORMAL HIGH (ref 70–99)

## 2011-11-13 ENCOUNTER — Ambulatory Visit (HOSPITAL_COMMUNITY)
Admission: RE | Admit: 2011-11-13 | Discharge: 2011-11-13 | Disposition: A | Discharge status: Home / Self Care | Payer: Medicare Other | Source: Ambulatory Visit | Attending: Cardiovascular Disease | Admitting: Cardiovascular Disease

## 2011-11-13 ENCOUNTER — Encounter (HOSPITAL_COMMUNITY)
Admission: RE | Disposition: A | Discharge status: Home / Self Care | Payer: Self-pay | Source: Ambulatory Visit | Attending: Cardiovascular Disease

## 2011-11-13 DIAGNOSIS — Z9861 Coronary angioplasty status: Secondary | ICD-10-CM | POA: Insufficient documentation

## 2011-11-13 DIAGNOSIS — I10 Essential (primary) hypertension: Secondary | ICD-10-CM | POA: Insufficient documentation

## 2011-11-13 DIAGNOSIS — I200 Unstable angina: Secondary | ICD-10-CM | POA: Insufficient documentation

## 2011-11-13 DIAGNOSIS — I2510 Atherosclerotic heart disease of native coronary artery without angina pectoris: Secondary | ICD-10-CM | POA: Insufficient documentation

## 2011-11-13 DIAGNOSIS — R0602 Shortness of breath: Secondary | ICD-10-CM | POA: Insufficient documentation

## 2011-11-13 DIAGNOSIS — Z85038 Personal history of other malignant neoplasm of large intestine: Secondary | ICD-10-CM | POA: Insufficient documentation

## 2011-11-13 DIAGNOSIS — E119 Type 2 diabetes mellitus without complications: Secondary | ICD-10-CM | POA: Insufficient documentation

## 2011-11-13 HISTORY — PX: LEFT HEART CATHETERIZATION WITH CORONARY ANGIOGRAM: SHX5451

## 2011-11-13 SURGERY — LEFT HEART CATHETERIZATION WITH CORONARY ANGIOGRAM
Anesthesia: LOCAL

## 2011-11-13 MED ORDER — HEPARIN SODIUM (PORCINE) 1000 UNIT/ML IJ SOLN
INTRAMUSCULAR | Status: AC
  Filled 2011-11-13: qty 1

## 2011-11-13 MED ORDER — SODIUM CHLORIDE 0.9 % IV SOLN: 1.0000 mL/kg/h | INTRAVENOUS | Status: DC

## 2011-11-13 MED ORDER — SODIUM CHLORIDE 0.9 % IJ SOLN: 3.0000 mL | INTRAMUSCULAR | Status: DC | PRN

## 2011-11-13 MED ORDER — MIDAZOLAM HCL 2 MG/2ML IJ SOLN
INTRAMUSCULAR | Status: AC
  Filled 2011-11-13: qty 2

## 2011-11-13 MED ORDER — SODIUM CHLORIDE 0.9 % IJ SOLN: 10.0000 mL | Freq: Two times a day (BID) | INTRAMUSCULAR | Status: DC

## 2011-11-13 MED ORDER — SODIUM CHLORIDE 0.9 % IJ SOLN
10.0000 mL | INTRAMUSCULAR | Status: DC | PRN
  Administered 2011-11-13: 10 mL

## 2011-11-13 MED ORDER — SODIUM CHLORIDE 0.9 % IV SOLN: INTRAVENOUS | Status: DC

## 2011-11-13 MED ORDER — LIDOCAINE HCL (PF) 1 % IJ SOLN
INTRAMUSCULAR | Status: AC
  Filled 2011-11-13: qty 30

## 2011-11-13 MED ORDER — HEPARIN SOD (PORK) LOCK FLUSH 100 UNIT/ML IV SOLN
500.0000 [IU] | INTRAVENOUS | Status: DC | PRN
  Administered 2011-11-13: 500 [IU]

## 2011-11-13 MED ORDER — DIAZEPAM 5 MG PO TABS
ORAL_TABLET | ORAL | Status: AC
  Administered 2011-11-13: 5 mg via ORAL
  Filled 2011-11-13: qty 1

## 2011-11-13 MED ORDER — ONDANSETRON HCL 4 MG/2ML IJ SOLN: 4.0000 mg | Freq: Four times a day (QID) | INTRAMUSCULAR | Status: DC | PRN

## 2011-11-13 MED ORDER — LABETALOL HCL 5 MG/ML IV SOLN
10.0000 mg | Freq: Once | INTRAVENOUS | Status: AC
  Administered 2011-11-13: 10 mg via INTRAVENOUS
  Filled 2011-11-13: qty 4

## 2011-11-13 MED ORDER — ACETAMINOPHEN 325 MG PO TABS: 650.0000 mg | ORAL_TABLET | ORAL | Status: DC | PRN

## 2011-11-13 MED ORDER — ASPIRIN 81 MG PO CHEW
CHEWABLE_TABLET | ORAL | Status: AC
  Administered 2011-11-13: 324 mg via ORAL
  Filled 2011-11-13: qty 4

## 2011-11-13 MED ORDER — DIAZEPAM 5 MG PO TABS
5.0000 mg | ORAL_TABLET | ORAL | Status: AC
  Administered 2011-11-13: 5 mg via ORAL

## 2011-11-13 MED ORDER — FENTANYL CITRATE 0.05 MG/ML IJ SOLN
INTRAMUSCULAR | Status: AC
  Filled 2011-11-13: qty 2

## 2011-11-13 MED ORDER — ASPIRIN 81 MG PO CHEW
324.0000 mg | CHEWABLE_TABLET | ORAL | Status: AC
  Administered 2011-11-13: 324 mg via ORAL

## 2011-11-13 MED ORDER — SODIUM CHLORIDE 0.9 % IJ SOLN: 3.0000 mL | Freq: Two times a day (BID) | INTRAMUSCULAR | Status: DC

## 2011-11-13 MED ORDER — SODIUM CHLORIDE 0.9 % IV SOLN: 250.0000 mL | INTRAVENOUS | Status: DC | PRN

## 2011-11-13 MED ORDER — NITROGLYCERIN 0.2 MG/ML ON CALL CATH LAB
INTRAVENOUS | Status: AC
  Filled 2011-11-13: qty 1

## 2011-11-13 MED ORDER — HEPARIN SOD (PORK) LOCK FLUSH 100 UNIT/ML IV SOLN: 500.0000 [IU] | INTRAVENOUS | Status: DC

## 2011-11-13 MED ORDER — HEPARIN (PORCINE) IN NACL 2-0.9 UNIT/ML-% IJ SOLN
INTRAMUSCULAR | Status: AC
  Filled 2011-11-13: qty 1000

## 2011-11-13 NOTE — CV Procedure (Signed)
   Cardiac Catheterization Procedure Note  Name: [name] MRN: [id number] DOB: Oct 28, 1945  Procedure: Left Heart Cath, Selective Coronary Angiography, LV angiography  Indication: CCS class III angina   Procedural Details: The right wrist was prepped, draped, and anesthetized with 1% lidocaine. Using the modified Seldinger technique, a 5 French sheath was introduced into the right radial artery. 3 mg of verapamil was administered through the sheath, weight-based unfractionated heparin was administered intravenously. Standard Judkins catheters were used for selective coronary angiography and left ventriculography. The left main coronary artery was a little bit difficult to cannulate. I was ultimately able to engage the vessel with an easy rad left 5 [place] guide catheter Catheter exchanges were performed over an exchange length guidewire. There were no immediate procedural complications. A TR band was used for radial hemostasis at the completion of the procedure.  The patient was transferred to the post catheterization recovery area for further monitoring.  Procedural Findings: Hemodynamics: AO 162/71 LV 173/29  Coronary angiography: Coronary dominance: right  Left mainstem: Patent without obstructive CAD  Left anterior descending (LAD): The LAD is diffusely diseased. The stented segment throughout the proximal to mid LAD has diffuse disease. The most proximal aspect of the stent demonstrates 50% in-stent restenosis. The distal stented segment extending beyond the distal stent edge has 80% stenosis across a long segment until the LAD normalizes in the mid vessel. The mid and distal LAD beyond the stented segment has scattered 40-50% stenosis. The first diagonal branch which is of moderate caliber as 80% proximal stenosis.  Left circumflex (LCx): The left circumflex is patent throughout. The first OM is a large vessel with mild nonobstructive plaque the second OM and third OM branches are  both small.  Right coronary artery (RCA): The RCA is a large, dominant vessel. The mid vessel has 70% stenosis. This is unchanged from the previous study. Otherwise there is minor nonobstructive smooth plaque of no more than 20-30% stenosis in the mid and distal vessel. The PDA and PLA branches are all patent.  Left ventriculography: Left ventricular systolic function is normal, LVEF is estimated at 55-65%, there is no significant mitral regurgitation   Final Conclusions:   1. Severe single-vessel coronary artery disease involving the LAD and first diagonal branches 2. Moderate right coronary artery stenosis, unchanged from the previous study 3. Widely patent left mainstem and left circumflex vessels 4. Normal left ventricular function  Recommendations: We will need to review revascularization options. 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 I think her most durable long-term treatment strategy would be a LIMA to LAD and graft to the first diagonal. However, I'm not sure that she will be a reasonable surgical candidate. I would like to see her back in the office for further discussion of treatment strategies. The downside of stenting is that the patient has type 2 diabetes, recurrent in-stent restenosis, and our he has overlapping drug-eluting stents in the LAD.  [name] 11/13/2011, 1:57 PM

## 2011-11-13 NOTE — H&P (View-Only) (Signed)
HPI:  66 year-old woman presenting for followup evaluation. She has coronary artery disease and has undergone previous PCI procedures. Her last stenting procedure involved treatment of the proximal LAD. The patient has had colon cancer and has had to undergo abdominal surgery since her last coronary intervention she had no perioperative cardiac problems.  Unfortunately, she has developed recurrent symptoms of chest pain and shortness of breath. She has sharp, pins and needles type of pain in the chest at nighttime. However, she also complains of neck and left arm pain it is more like an aching pain that occurs periodically. She is much more short of breath with activity. She denies orthopnea or PND. Her symptoms have progressed over the last 6 months. She has no central chest pressure and she specifically denies chest pressure with exertion.  Outpatient Encounter Prescriptions as of 11/07/2011  Medication Sig Dispense Refill  . aspirin EC 81 MG tablet Take 81 mg by mouth daily.      Marland Kitchen FLUoxetine (PROZAC) 20 MG capsule Take 20 mg by mouth 3 (three) times daily.      . furosemide (LASIX) 20 MG tablet Take 20 mg by mouth as needed. For fluid      . insulin glargine (LANTUS) 100 UNIT/ML injection Inject 40 Units into the skin 2 (two) times daily.       . Insulin Lispro, Human, (HUMALOG PEN [place]) Inject 15 Units into the skin 2 (two) times daily.       Marland Kitchen losartan (COZAAR) 50 MG tablet Take 50 mg by mouth daily.      . metFORMIN (GLUCOPHAGE) 500 MG tablet Take 1,000 mg by mouth 2 (two) times daily.      . methylcellulose (ARTIFICIAL TEARS) 1 % ophthalmic solution Place 1 drop into both eyes as needed. For dry eyes      . Multiple Vitamin (MULTIVITAMIN WITH MINERALS) TABS Take 1 tablet by mouth daily.      . naproxen sodium (ANAPROX) 220 MG tablet Take 220 mg by mouth every 8 (eight) hours as needed. For pain      . nitroGLYCERIN (NITROSTAT) 0.4 MG SL tablet Place 0.4 mg under the tongue every 5 (five)  minutes as needed. As needed for chest pain. May repeat times 3        . pravastatin (PRAVACHOL) 20 MG tablet Take 20 mg by mouth daily.      Marland Kitchen trolamine salicylate (ASPERCREME) 10 % cream Apply 1 application topically as needed. For pain in knees      . DISCONTD: atenolol (TENORMIN) 50 MG tablet Take 50 mg by mouth daily.        . isosorbide mononitrate (IMDUR) 30 MG 24 hr tablet Take 1 tablet (30 mg total) by mouth daily.  30 tablet  3  . metoprolol (LOPRESSOR) 50 MG tablet Take 1 tablet (50 mg total) by mouth 2 (two) times daily.  60 tablet  3  . DISCONTD: Polyethylene Glycol 3350 (MIRALAX PO) Take 17 g by mouth as needed. For constipation      . DISCONTD: rosuvastatin (CRESTOR) 10 MG tablet Take 10 mg by mouth daily.          Allergies  Allergen Reactions  . Morphine Shortness Of Breath, Itching and Other (See Comments)    Makes patient feel as if her "insides" are on fire.  . Latex Itching    Only where touched on body with it.  . Iohexol      Code: RASH, Desc: pt  states she broke out in a rash from iv contrast in 1993.  cannot remember any details....pt had contrast in 2003 and was not premedicated.  We did her w/o meds today and she did fine. Thanks, Dorma Russell, Onset Date: [id number]   . Codeine Itching and Rash    All over body.  . Penicillins Itching and Rash    Localized.    Past Medical History  Diagnosis Date  . Coronary artery disease     s/p stent to lad 2006 w subsequent kissing balloon pci to diagona, pci of the lad w a drug-eluting stent July 2010  . Ejection fraction < 50%     (is preserved)  . Diabetes mellitus   . Hypertension   . Dyslipidemia   . Morbid obesity   . Fibromyalgia   . GERD (gastroesophageal reflux disease)   . Varicose vein   . Gastroparesis   . Depressed   . Anxiety   . Hiatal hernia   . Osteoarthritis   . Hyperlipidemia   . SOB (shortness of breath)   . Bronchitis   . Blurred vision   . Sinus problem   . Adenocarcinoma     ascending colon,  LNs pos -- S/P right hemicolectomy, xeloda completed 6/10 (dr. Cleone Slim)  . Acoustic neuroma     left ear    ROS: Negative except as per HPI  BP 188/86  Pulse 65  Ht 5\' 6"  (1.676 m)  Wt 115.395 kg (254 lb 6.4 oz)  BMI 41.06 kg/m2  PHYSICAL EXAM: Pt is alert and oriented, pleasant, obese female in NAD HEENT: normal Neck: JVP - normal, carotids 2+= without bruits Lungs: CTA bilaterally CV: RRR without murmur or gallop Abd: soft, NT, Positive BS, no hepatomegaly Ext: 1+ bilateral ankle and pretibial edema, distal pulses intact and equal Skin: warm/dry no rash  EKG:  Normal sinus rhythm 65 beats per minute, within normal limits.  ASSESSMENT AND PLAN: 1. Coronary atherosclerosis, native vessel with CCS class III angina. The patient also has developed significant dyspnea with exertion, concerning for a secondary symptom of angina. I have recommended escalating her medical program as follows: Change beta blocker to metoprolol 50 mg twice daily and start isosorbide mononitrate 30 mg daily. We will proceed with cardiac catheterization and possible PCI, especially considering this patient has had previous stenting of the proximal LAD and she is at risk for having a large area of ischemia based on his coronary anatomy. I have reviewed the risks, indications, and alternatives to cardiac catheterization and PCI. She understands and agrees to proceed.  2. Essential hypertension, uncontrolled. Changes as noted above with increase in her beta blocker and addition of a long-acting nitrate. She should continue on her other antihypertensive medications without change.  3. Hyperlipidemia. The patient is on a statin drug with pravastatin 20 mg. She follows with her primary care physician.  Plans as above for outpatient cardiac catheterization and possible PCI. Further followup pending the results of the cath.  [name] 11/07/2011 5:51 PM

## 2011-11-13 NOTE — Interval H&P Note (Signed)
History and Physical Interval Note:  11/13/2011 12:51 PM  [name]  has presented today for surgery, with the diagnosis of cp  The various methods of treatment have been discussed with the patient and family. After consideration of risks, benefits and other options for treatment, the patient has consented to  Procedure(s) (LRB) with comments: LEFT HEART CATHETERIZATION WITH CORONARY ANGIOGRAM (N/A) as a surgical intervention .  The patient's history has been reviewed, patient examined, no change in status, stable for surgery.  I have reviewed the patient's chart and labs.  Questions were answered to the patient's satisfaction.     [name]

## 2011-11-29 ENCOUNTER — Encounter: Payer: Self-pay | Admitting: Physician Assistant

## 2011-11-29 ENCOUNTER — Ambulatory Visit (INDEPENDENT_AMBULATORY_CARE_PROVIDER_SITE_OTHER): Payer: Medicare Other | Admitting: Physician Assistant

## 2011-11-29 VITALS — BP 140/90 | HR 87 | Ht 65.5 in | Wt 249.0 lb

## 2011-11-29 DIAGNOSIS — I209 Angina pectoris, unspecified: Secondary | ICD-10-CM

## 2011-11-29 DIAGNOSIS — I10 Essential (primary) hypertension: Secondary | ICD-10-CM

## 2011-11-29 DIAGNOSIS — I2510 Atherosclerotic heart disease of native coronary artery without angina pectoris: Secondary | ICD-10-CM

## 2011-11-29 MED ORDER — AMLODIPINE BESYLATE 5 MG PO TABS: 5.0000 mg | ORAL_TABLET | Freq: Every day | ORAL | Status: DC

## 2011-11-29 MED ORDER — FUROSEMIDE 20 MG PO TABS: 20.0000 mg | ORAL_TABLET | Freq: Every day | ORAL | Status: DC

## 2011-11-29 NOTE — Progress Notes (Signed)
[address] [place], [place]  [address] Phone: [phone] Fax:  [phone]  Date:  11/29/2011   Name:  [name]   DOB:  03/29/1945   MRN:  [id number]  PCP:  [name], MD  Primary Cardiologist:  Dr. [name]  Primary Electrophysiologist:  None    History of Present Illness: [name] is a 66 y.o. female who returns for follow up after cardiac cath.  She has a hx of CAD, s/p prior PCI to LAD in 2006 and 2010, DM2, HTN, HL, colon CA, acoustic neuroma s/p excision.  She recently saw Dr. Excell Seltzer in followup with symptoms consistent with class III angina. He adjusted her beta blocker and added nitrates. Cardiac catheterization was recommended. LHC 11/13/11: p-mLAD stent with prox 50% ISR, 80% beyond dist edge of stent, m+dLAD beyond stent 40-50%, mod caliber pD1 80%, mRCA 70%, m-dRCA 20-30%, EF 55-65%.  Given her anatomy, it was overall felt that her most durable long-term treatment strategy would be bypass of the LAD and diagonal. However, it was not certain that she would be a reasonable surgical candidate. She was brought back for followup today to discuss treatment strategies.  She could not tolerate Imdur.  Notes severe headache with this.  She notes no change in her symptoms.  Still notes chest discomfort with mild exertion and at rest, especially at night.  Notes assoc dyspnea.  No syncope, orthopnea, PND.  LE edema is stable.    Labs (9/13):  K 4.6, creatinine 0.9, Hgb 12.8  Wt Readings from Last 3 Encounters:  11/13/11 252 lb (114.306 kg)  11/13/11 252 lb (114.306 kg)  11/07/11 254 lb 6.4 oz (115.395 kg)     Past Medical History  Diagnosis Date  . Coronary artery disease     a. s/p stent to lad 2006 w subsequent kissing balloon pci to diagonal;  b.  pci of the lad w a drug-eluting stent July 2010;  c.  LHC 11/13/11: p-mLAD stent with prox 50% ISR, 80% beyond dist edge of stent, m+dLAD beyond stent 40-50%, mod caliber pD1 80%, mRCA 70%,  m-dRCA 20-30%, EF 55-65% => med Rx (consider CABG)  . Diabetes mellitus   . Hypertension   . Dyslipidemia   . Morbid obesity   . Fibromyalgia   . GERD (gastroesophageal reflux disease)   . Varicose vein   . Gastroparesis   . Depressed   . Anxiety   . Hiatal hernia   . Osteoarthritis   . Blurred vision   . Adenocarcinoma     ascending colon, LNs pos -- S/P right hemicolectomy, xeloda completed 6/10 (dr. Cleone Slim)  . Acoustic neuroma     left ear    Current Outpatient Prescriptions  Medication Sig Dispense Refill  . aspirin EC 81 MG tablet Take 81 mg by mouth daily.      Marland Kitchen FLUoxetine (PROZAC) 20 MG capsule Take 20 mg by mouth 3 (three) times daily.      . furosemide (LASIX) 20 MG tablet Take 20 mg by mouth as needed. For fluid      . insulin glargine (LANTUS) 100 UNIT/ML injection Inject 40 Units into the skin 2 (two) times daily.       . Insulin Lispro, Human, (HUMALOG PEN [place]) Inject 15 Units into the skin 2 (two) times daily.       . isosorbide mononitrate (IMDUR) 30 MG 24 hr tablet Take 1 tablet (30 mg total) by mouth daily.  30 tablet  3  . losartan (COZAAR) 50 MG tablet Take 50 mg by mouth daily.      . metFORMIN (GLUCOPHAGE) 500 MG tablet Take 1,000 mg by mouth 2 (two) times daily.      . methylcellulose (ARTIFICIAL TEARS) 1 % ophthalmic solution Place 1 drop into both eyes as needed. For dry eyes      . metoprolol (LOPRESSOR) 50 MG tablet Take 1 tablet (50 mg total) by mouth 2 (two) times daily.  60 tablet  3  . Multiple Vitamin (MULTIVITAMIN WITH MINERALS) TABS Take 1 tablet by mouth daily.      . naproxen sodium (ANAPROX) 220 MG tablet Take 220 mg by mouth every 8 (eight) hours as needed. For pain      . nitroGLYCERIN (NITROSTAT) 0.4 MG SL tablet Place 0.4 mg under the tongue every 5 (five) minutes as needed. As needed for chest pain. May repeat times 3        . pravastatin (PRAVACHOL) 20 MG tablet Take 20 mg by mouth daily.      Marland Kitchen trolamine salicylate (ASPERCREME) 10 % cream  Apply 1 application topically as needed. For pain in knees        Allergies: Allergies  Allergen Reactions  . Morphine Shortness Of Breath, Itching and Other (See Comments)    Makes patient feel as if her "insides" are on fire.  . Latex Itching    Only where touched on body with it.  . Codeine Itching and Rash    All over body.  . Penicillins Itching and Rash    Localized.    History  Substance Use Topics  . Smoking status: Never Smoker   . Smokeless tobacco: Not on file  . Alcohol Use: No     occasional     ROS:  Please see the history of present illness.     All other systems reviewed and negative.   PHYSICAL EXAM: VS:  BP 140/90  Pulse 87  Ht 5' 5.5" (1.664 m)  Wt 249 lb (112.946 kg)  BMI 40.81 kg/m2 Well nourished, well developed, in no acute distress HEENT: normal Neck: no JVD at 90 Cardiac:  normal S1, S2; RRR; no murmur Lungs:  Decreased breath sounds bilaterally, no wheezing, rhonchi or rales Abd: soft, nontender Ext: Trace bilateral LE edema Skin: warm and dry Neuro:  CNs 2-12 intact, no focal abnormalities noted  EKG:  NSR, HR 87, inferior and anterior T-wave inversions      ASSESSMENT AND PLAN:  1. Coronary Artery Disease: She was unable to tolerate isosorbide. She's currently on metoprolol 50 mg twice a day. She continues to have class III angina without significant change. Dr. Excell Seltzer plan to see her back in the office to discuss options for therapy. She was also seen by him today. We will try to add amlodipine 5 mg daily to see if this helps with her angina.  Follow up with Dr. [name] in 2-3 mos.    2. Hypertension: Add amlodipine as noted above.  Dr. Excell Seltzer suggested patient also take Lasix daily instead of prn.    3. Hyperlipidemia: Managed by primary care.  SignedTereso Newcomer, PA-C  11:34 AM 11/29/2011

## 2011-11-29 NOTE — Patient Instructions (Addendum)
Your physician has recommended you make the following change in your medication:  1.) START NORVASC ONE TABLET ( 5 MG) DAILY 2.)  CHANGE LASIX ONE TABLET ( 20 MG ) DAILY 3.) STOP IMDUR SENT IN YOUR PRESCRIPTIONS INTO YOUR PHARMACY   Your physician recommends that you schedule a follow-up appointment in: 3 MONTHS WITH DR.COOPER

## 2011-12-19 ENCOUNTER — Ambulatory Visit: Payer: Medicare Other | Admitting: Cardiovascular Disease

## 2011-12-25 ENCOUNTER — Telehealth: Payer: Self-pay | Admitting: Cardiovascular Disease

## 2011-12-25 DIAGNOSIS — C189 Malignant neoplasm of colon, unspecified: Secondary | ICD-10-CM

## 2011-12-25 DIAGNOSIS — Z452 Encounter for adjustment and management of vascular access device: Secondary | ICD-10-CM

## 2011-12-25 NOTE — Telephone Encounter (Signed)
New problem:   Would like to schedule her open heart surgery.

## 2011-12-25 NOTE — Telephone Encounter (Signed)
Pt has magic jack, it will not allow phone call from restricted number. I spoke with Gesila/ scheduler  and pt said she wanted Dr Excell Seltzer to know she is ready to schedule app for her needed surgery. I will forward msg to Dr/nurse

## 2011-12-26 ENCOUNTER — Telehealth: Payer: Self-pay | Admitting: Cardiovascular Disease

## 2011-12-26 NOTE — Telephone Encounter (Signed)
Call can not be completed as dialed.  [employer] will not accept calls from our phone number.  One message has already been forwarded to MD/nurse.

## 2011-12-26 NOTE — Telephone Encounter (Signed)
Pt ready to schedule open heart surgery, pt needs referral, pt having more problems

## 2011-12-27 ENCOUNTER — Telehealth: Payer: Self-pay | Admitting: Cardiovascular Disease

## 2011-12-27 DIAGNOSIS — I2510 Atherosclerotic heart disease of native coronary artery without angina pectoris: Secondary | ICD-10-CM

## 2011-12-27 NOTE — Telephone Encounter (Signed)
I tried to call her but can't get through. She could go for cardiac surgical referral if she would like. Alternatively, I would be happy to see her back for follow-up if she would like to discuss further with me.  [name] 12/27/2011 4:29 PM

## 2011-12-27 NOTE — Telephone Encounter (Signed)
[name], MD 12/27/2011 4:29 PM Signed  I tried to call her but can't get through. She could go for cardiac surgical referral if she would like. Alternatively, I would be happy to see her back for follow-up if she would like to discuss further with me.  [name]  12/27/2011  4:29 PM

## 2011-12-27 NOTE — Telephone Encounter (Signed)
Pt was wondering what the status of scheduling her open heart surgery

## 2012-01-01 NOTE — Telephone Encounter (Signed)
The pt called back and I made her aware that we have been unable to reach her because our number is blocked.  The pt would like to proceed with a [facility] referral.  I will place order for evaluation with cardiac surgeon.  Due to the pt having issues with her phone she will call our office on 01/06/12 if she has not been contacted for an appointment.

## 2012-01-01 NOTE — Telephone Encounter (Signed)
F/u  Status of referral  - was told she will need open heart surgery

## 2012-01-01 NOTE — Addendum Note (Signed)
Addended by: Iona Coach on: 01/01/2012 03:03 PM   Modules accepted: Orders

## 2012-01-01 NOTE — Telephone Encounter (Signed)
I mailed a letter to the pt's home to remove the block on her phone line and then contact our office.

## 2012-01-01 NOTE — Telephone Encounter (Signed)
I attempted to call the pt but due to [employer] I cannot get through to speak with her.  I need to speak with this pt when she calls because I need to discuss her plan of care.

## 2012-01-03 ENCOUNTER — Encounter: Payer: Self-pay | Admitting: *Deleted

## 2012-01-14 ENCOUNTER — Encounter: Payer: Self-pay | Admitting: Surgery

## 2012-01-14 ENCOUNTER — Other Ambulatory Visit: Payer: Self-pay | Admitting: *Deleted

## 2012-01-14 ENCOUNTER — Institutional Professional Consult (permissible substitution) (INDEPENDENT_AMBULATORY_CARE_PROVIDER_SITE_OTHER): Payer: Medicare Other | Admitting: Surgery

## 2012-01-14 VITALS — BP 148/76 | HR 108 | Resp 20 | Ht 65.5 in | Wt 252.0 lb

## 2012-01-14 DIAGNOSIS — I2510 Atherosclerotic heart disease of native coronary artery without angina pectoris: Secondary | ICD-10-CM

## 2012-01-15 ENCOUNTER — Encounter (HOSPITAL_COMMUNITY): Payer: Self-pay | Admitting: Pharmacy Technician

## 2012-01-16 ENCOUNTER — Ambulatory Visit (HOSPITAL_COMMUNITY)
Admit: 2012-01-16 | Discharge: 2012-01-16 | Disposition: A | Discharge status: Home / Self Care | Payer: Medicare Other | Attending: Surgery | Admitting: Surgery

## 2012-01-16 ENCOUNTER — Encounter: Payer: Self-pay | Admitting: Surgery

## 2012-01-16 ENCOUNTER — Encounter (HOSPITAL_COMMUNITY)
Admit: 2012-01-16 | Discharge: 2012-01-16 | Disposition: A | Discharge status: Home / Self Care | Payer: Medicare Other | Attending: Surgery | Admitting: Surgery

## 2012-01-16 ENCOUNTER — Inpatient Hospital Stay (HOSPITAL_COMMUNITY)
Admit: 2012-01-16 | Discharge: 2012-01-16 | Disposition: A | Discharge status: Home / Self Care | Payer: Medicare Other | Attending: Surgery | Admitting: Surgery

## 2012-01-16 ENCOUNTER — Encounter (HOSPITAL_COMMUNITY): Payer: Self-pay

## 2012-01-16 VITALS — BP 130/68 | HR 79 | Temp 98.1°F | Resp 20 | Ht 65.0 in | Wt 242.0 lb

## 2012-01-16 DIAGNOSIS — E119 Type 2 diabetes mellitus without complications: Secondary | ICD-10-CM | POA: Insufficient documentation

## 2012-01-16 DIAGNOSIS — Z01810 Encounter for preprocedural cardiovascular examination: Secondary | ICD-10-CM

## 2012-01-16 DIAGNOSIS — I2510 Atherosclerotic heart disease of native coronary artery without angina pectoris: Secondary | ICD-10-CM | POA: Insufficient documentation

## 2012-01-16 HISTORY — DX: Unspecified asthma, uncomplicated: J45.909

## 2012-01-16 HISTORY — DX: Headache: R51

## 2012-01-16 HISTORY — DX: Sleep apnea, unspecified: G47.30

## 2012-01-16 HISTORY — DX: Angina pectoris, unspecified: I20.9

## 2012-01-16 LAB — COMPREHENSIVE METABOLIC PANEL
ALT: 28 U/L (ref 0–35)
Albumin: 3.7 g/dL (ref 3.5–5.2)
Alkaline Phosphatase: 98 U/L (ref 39–117)
Calcium: 9.6 mg/dL (ref 8.4–10.5)
Potassium: 3.8 mEq/L (ref 3.5–5.1)
Sodium: 136 mEq/L (ref 135–145)
Total Protein: 7.1 g/dL (ref 6.0–8.3)

## 2012-01-16 LAB — URINALYSIS, ROUTINE W REFLEX MICROSCOPIC
Bilirubin Urine: NEGATIVE
Glucose, UA: 250 mg/dL — AB
Ketones, ur: NEGATIVE mg/dL
Nitrite: NEGATIVE
Protein, ur: NEGATIVE mg/dL
pH: 5.5 (ref 5.0–8.0)

## 2012-01-16 LAB — BLOOD GAS, ARTERIAL
Bicarbonate: 20.4 mEq/L (ref 20.0–24.0)
Drawn by: 24485
FIO2: 0.21 %
Patient temperature: 98.6
pH, Arterial: 7.49 — ABNORMAL HIGH (ref 7.350–7.450)

## 2012-01-16 LAB — SURGICAL PCR SCREEN
MRSA, PCR: NEGATIVE
Staphylococcus aureus: POSITIVE — AB

## 2012-01-16 LAB — URINE MICROSCOPIC-ADD ON

## 2012-01-16 LAB — PROTIME-INR
INR: 0.9 (ref 0.00–1.49)
Prothrombin Time: 12.1 seconds (ref 11.6–15.2)

## 2012-01-16 LAB — CBC
MCH: 31 pg (ref 26.0–34.0)
MCHC: 34.3 g/dL (ref 30.0–36.0)
Platelets: 265 10*3/uL (ref 150–400)
RDW: 13.4 % (ref 11.5–15.5)

## 2012-01-16 LAB — PULMONARY FUNCTION TEST

## 2012-01-16 LAB — APTT: aPTT: 28 seconds (ref 24–37)

## 2012-01-16 LAB — TYPE AND SCREEN: ABO/RH(D): O POS

## 2012-01-16 MED ORDER — ALBUTEROL SULFATE (5 MG/ML) 0.5% IN NEBU
2.5000 mg | INHALATION_SOLUTION | Freq: Once | RESPIRATORY_TRACT | Status: AC
  Administered 2012-01-16: 2.5 mg via RESPIRATORY_TRACT

## 2012-01-16 NOTE — Pre-Procedure Instructions (Signed)
20 [name]  01/16/2012   Your procedure is scheduled on: Monday  01/20/12  Report to [facility] at 530  AM.  Call this number if you have problems the morning of surgery: [phone]   Remember:   Do not eat food OR DRINK :After Midnight.     Take these medicines the morning of surgery with A SIP OF WATER:  AMLODIPINE (NORVASC), PROZAC, METOPROLOL(LOPRESSOR)   Do not wear jewelry, make-up or nail polish.  Do not wear lotions, powders, or perfumes. You may wear deodorant.  Do not shave 48 hours prior to surgery. Men may shave face and neck.  Do not bring valuables to the hospital.  Contacts, dentures or bridgework may not be worn into surgery.  Leave suitcase in the car. After surgery it may be brought to your room.  For patients admitted to the hospital, checkout time is 11:00 AM the day of discharge.   Patients discharged the day of surgery will not be allowed to drive home.  Name and phone number of your driver:  Special Instructions: Shower using CHG 2 nights before surgery and the night before surgery.  If you shower the day of surgery use CHG.  Use special wash - you have one bottle of CHG for all showers.  You should use approximately 1/3 of the bottle for each shower.   Please read over the following fact sheets that you were given: Pain Booklet, Coughing and Deep Breathing, Blood Transfusion Information, Open Heart Packet, Surgical Site Infection Prevention and Anesthesia Post-op Instructions

## 2012-01-16 NOTE — Progress Notes (Signed)
[address]            [name] [address]          [phone]      PCP is [name], MD Referring Provider is [name], MD  Chief Complaint  Patient presents with  . Coronary Artery Disease    Referral from Dr Excell Seltzer for surgical eval on CAD, cardiac cath 11/13/11    HPI:  The patient is a 66 year old woman with a history of diabetes, hypertension, and dyslipidemia who also has history of coronary disease and underwent stenting of the LAD in 2006 with subsequent kissing balloon PCI of the LAD and diagonal with insertion of a second drug-eluting stent into the LAD in July of 2010. She was subsequently diagnosed with colon cancer and underwent right colon resection. She apparently had positive lymph nodes and was treated with chemotherapy. She had been followed by Dr. Cleone Slim until he retired. She now presents with recurrent symptoms of chest pain and shortness of breath with minimal exertion. Cardiac catheterization showed severe 2 vessel coronary disease involving the LAD and first diagonal branch with a 70% mid right coronary stenosis that is unchanged from her previous study. Of note, she had some varicose veins removed from her left lower leg.  Past Medical History  Diagnosis Date  . Coronary artery disease     a. s/p stent to lad 2006 w subsequent kissing balloon pci to diagonal;  b.  pci of the lad w a drug-eluting stent July 2010;  c.  LHC 11/13/11: p-mLAD stent with prox 50% ISR, 80% beyond dist edge of stent, m+dLAD beyond stent 40-50%, mod caliber pD1 80%, mRCA 70%, m-dRCA 20-30%, EF 55-65% => med Rx (consider CABG)  . Diabetes mellitus   . Hypertension   . Dyslipidemia   . Morbid obesity   . GERD (gastroesophageal reflux disease)   . Varicose vein   . Gastroparesis   . Osteoarthritis   . Blurred vision   . Acoustic neuroma     left ear  . Anxiety   . Depressed   . Asthma     AS CHILD   . Sleep apnea     12+ YRS NO MACHINE  GOTTEN   . Anginal pain     DR Gwynneth Macleod    . Headache   . Fibromyalgia   . Hiatal hernia   . Adenocarcinoma     ascending colon, LNs pos -- S/P right hemicolectomy, xeloda completed 6/10 (dr. Cleone Slim)    Past Surgical History  Procedure Date  . Cataract extraction   . Fetal surgery for congenital hernia   . Knee arthroscopy   . Replacement total knee   . Tonsillectomy   . Hemicolectomy 12/28/07    right for adenocarcinoma of ascending colon  . Hysterotomy 1993  . Herniaorraphy 01/22/06    for recurrent ventral hernia  . Laparotomy     for small bowl obstruction  . Resecton of acoustic neuroma   . Stent surgery   . Abdominal hysterectomy 1993  . Cholecystectomy 1966  . Hernia repair 1990, 2002, 2007    open x1, lap x2   . Colon surgery   . Portacath placement   . Excision morton's neuroma   . Appendectomy   . Cardiac catheterization   . Lesion removal 10/08/2011    Procedure: LESION REMOVAL;  Surgeon: Darreld Mclean, MD;  Location: [facility];  Service: Orthopedics;  Laterality: Right;  Removal Lesion Right Long Finger  . Back surgery   . Eye surgery     cataract on right 2005, left 2008  . Joint replacement 2005 and 2006    right 2005, left 2006  . Finger replantation     RECENTLY   . Mandible surgery     Family History  Problem Relation Age of Onset  . Coronary artery disease Other     family history  . Asthma Other     family history-grandfather  . Colon cancer Other     family hx  . Diabetes Maternal Grandmother     unspecif if Maternal or Paternal  . Heart failure Mother   . Cirrhosis Father     Social History History  Substance Use Topics  . Smoking status: Never Smoker   . Smokeless tobacco: Not on file  . Alcohol Use: No     Comment: occasional    Current Outpatient Prescriptions  Medication Sig Dispense Refill  . amLODipine (NORVASC) 5 MG tablet Take 1 tablet (5 mg total) by mouth daily.  30 tablet  11  . aspirin EC 81 MG tablet Take 81 mg  by mouth daily.      Marland Kitchen FLUoxetine (PROZAC) 20 MG capsule Take 20 mg by mouth 3 (three) times daily.      . furosemide (LASIX) 20 MG tablet Take 1 tablet (20 mg total) by mouth daily. For fluid  30 tablet  11  . insulin glargine (LANTUS) 100 UNIT/ML injection Inject 40 Units into the skin 2 (two) times daily.       . Insulin Lispro, Human, (HUMALOG PEN [place]) Inject 15 Units into the skin 2 (two) times daily.       Marland Kitchen losartan (COZAAR) 50 MG tablet Take 50 mg by mouth daily.      . metFORMIN (GLUCOPHAGE) 500 MG tablet Take 1,000 mg by mouth 2 (two) times daily.      . methylcellulose (ARTIFICIAL TEARS) 1 % ophthalmic solution Place 1 drop into both eyes as needed. For dry eyes      . metoprolol (LOPRESSOR) 50 MG tablet Take 1 tablet (50 mg total) by mouth 2 (two) times daily.  60 tablet  3  . Multiple Vitamin (MULTIVITAMIN WITH MINERALS) TABS Take 1 tablet by mouth daily.      . naproxen sodium (ANAPROX) 220 MG tablet Take 220 mg by mouth every 8 (eight) hours as needed. For pain      . nitroGLYCERIN (NITROSTAT) 0.4 MG SL tablet Place 0.4 mg under the tongue every 5 (five) minutes as needed. As needed for chest pain. May repeat times 3        . pravastatin (PRAVACHOL) 20 MG tablet Take 20 mg by mouth daily.      Marland Kitchen trolamine salicylate (ASPERCREME) 10 % cream Apply 1 application topically as needed. For pain in knees       No current facility-administered medications for this visit.   Facility-Administered Medications Ordered in Other Visits  Medication Dose Route Frequency Provider Last Rate Last Dose  . [COMPLETED] albuterol (PROVENTIL) (5 MG/ML) 0.5% nebulizer solution 2.5 mg  2.5 mg Nebulization Once Alleen Borne, MD   2.5 mg at 01/16/12 1259    Allergies  Allergen Reactions  . Latex Itching    Only where touched on body with it.  . Morphine Shortness Of Breath, Itching and Other (See Comments)    Makes patient  feel as if her "insides" are on fire.  . Codeine Itching and Rash    All  over body.  . Penicillins Itching and Rash    Localized.    Review of Systems  Constitutional: Positive for activity change, appetite change, fatigue and unexpected weight change.  HENT: Positive for hearing loss.   Eyes: Positive for visual disturbance.       Floaters  Respiratory: Positive for cough and shortness of breath.   Cardiovascular: Positive for chest pain and palpitations.  Gastrointestinal: Negative.   Genitourinary: Negative.   Musculoskeletal: Positive for myalgias, joint swelling, arthralgias and gait problem.  Skin: Negative.   Neurological: Positive for headaches.       Chronic pain Memory disturbance neuropathy  Hematological: Negative.   Psychiatric/Behavioral: Positive for dysphoric mood. The patient is nervous/anxious.     BP 148/76  Pulse 108  Resp 20  Ht 5' 5.5" (1.664 m)  Wt 252 lb (114.306 kg)  BMI 41.30 kg/m2  SpO2 94% Physical Exam  Constitutional: She is oriented to person, place, and time.       Obese white female in no distress  HENT:  Head: Normocephalic and atraumatic.  Mouth/Throat: Oropharynx is clear and moist.  Eyes: Conjunctivae normal and EOM are normal. Pupils are equal, round, and reactive to light.  Neck: Normal range of motion. Neck supple. No JVD present. No thyromegaly present.  Cardiovascular: Normal rate, regular rhythm, normal heart sounds and intact distal pulses.  Exam reveals no gallop and no friction rub.   No murmur heard. Pulmonary/Chest: Effort normal and breath sounds normal. No respiratory distress. She has no rales.  Abdominal: Soft. Bowel sounds are normal. She exhibits no distension and no mass. There is no tenderness.  Musculoskeletal: She exhibits no edema.  Lymphadenopathy:    She has no cervical adenopathy.  Neurological: She is alert and oriented to person, place, and time. She has normal strength. No cranial nerve deficit or sensory deficit.  Skin: Skin is warm and dry.  Psychiatric: She has a normal  mood and affect.     Diagnostic Tests:  Cardiac Cath:  Procedural Findings: Hemodynamics: AO 162/71 LV 173/29  Coronary angiography: Coronary dominance: right  Left mainstem: Patent without obstructive CAD  Left anterior descending (LAD): The LAD is diffusely diseased. The stented segment throughout the proximal to mid LAD has diffuse disease. The most proximal aspect of the stent demonstrates 50% in-stent restenosis. The distal stented segment extending beyond the distal stent edge has 80% stenosis across a long segment until the LAD normalizes in the mid vessel. The mid and distal LAD beyond the stented segment has scattered 40-50% stenosis. The first diagonal branch which is of moderate caliber as 80% proximal stenosis.  Left circumflex (LCx): The left circumflex is patent throughout. The first OM is a large vessel with mild nonobstructive plaque the second OM and third OM branches are both small.  Right coronary artery (RCA): The RCA is a large, dominant vessel. The mid vessel has 70% stenosis. This is unchanged from the previous study. Otherwise there is minor nonobstructive smooth plaque of no more than 20-30% stenosis in the mid and distal vessel. The PDA and PLA branches are all patent.  Left ventriculography: Left ventricular systolic function is normal, LVEF is estimated at 55-65%, there is no significant mitral regurgitation   Final Conclusions:   1. Severe single-vessel coronary artery disease involving the LAD and first diagonal branches 2. Moderate right coronary artery stenosis, unchanged from the  previous study 3. Widely patent left mainstem and left circumflex vessels 4. Normal left ventricular function  Recommendations: We will need to review revascularization options. 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 I think her most durable long-term treatment strategy would be a LIMA to LAD and graft  to the first diagonal. However, I'm not sure that she will be a reasonable surgical candidate. I would like to see her back in the office for further discussion of treatment strategies. The downside of stenting is that the patient has type 2 diabetes, recurrent in-stent restenosis, and our he has overlapping drug-eluting stents in the LAD.  [name] 11/13/2011, 1:57 PM    Impression:  She has severe two-vessel coronary disease with worsening anginal symptoms occurring with minimal exertion around her house. I agree that coronary bypass graft surgery is the best treatment to prevent further ischemia and infarction and improve her quality of life. I discussed the operative procedure with the patient and family including alternatives, benefits and risks; including but not limited to bleeding, blood transfusion, infection, stroke, myocardial infarction, graft failure, heart block requiring a permanent pacemaker, organ dysfunction, and death.  [name] understands and agrees to proceed.    Plan:  I will perform CABG on Monday 01/20/2012.

## 2012-01-16 NOTE — Progress Notes (Signed)
VASCULAR LAB PRELIMINARY  PRELIMINARY  PRELIMINARY  PRELIMINARY  Pre-op Cardiac Surgery  Carotid Findings:  Bilateral:  No evidence of hemodynamically significant internal carotid artery stenosis.   Vertebral artery flow is antegrade.      Upper Extremity Right Left  Brachial Pressures 140 T T  Radial Waveforms T T  Ulnar Waveforms T T  Palmar Arch (Allen's Test) WNL WNL   Findings:  Doppler waveforms remain normal with ulnar and radial compressions bilaterally.    Lower  Extremity Right Left  Dorsalis Pedis    Anterior Tibial 137 T 139 T  Posterior Tibial 58 Severely dampened monophasic 150 Dampened monophasic  Ankle/Brachial Indices 0.98 >1.0    Findings:  ABI is within normal limits with abnormal Doppler waveforms in the posterior tibial artery bilaterally.   [name], 01/16/2012, 12:30 PM

## 2012-01-18 LAB — URINE CULTURE: Colony Count: 95000

## 2012-01-19 MED ORDER — PHENYLEPHRINE HCL 10 MG/ML IJ SOLN
30.0000 ug/min | INTRAMUSCULAR | Status: AC
  Administered 2012-01-20 (×2): 25 ug/min via INTRAVENOUS
  Filled 2012-01-19: qty 2

## 2012-01-19 MED ORDER — DEXTROSE 5 % IV SOLN
750.0000 mg | INTRAVENOUS | Status: DC
  Filled 2012-01-19: qty 750

## 2012-01-19 MED ORDER — EPINEPHRINE HCL 1 MG/ML IJ SOLN
0.5000 ug/min | INTRAVENOUS | Status: DC
  Filled 2012-01-19: qty 4

## 2012-01-19 MED ORDER — VANCOMYCIN HCL 1000 MG IV SOLR
1500.0000 mg | INTRAVENOUS | Status: AC
  Administered 2012-01-20: 1500 mg via INTRAVENOUS
  Filled 2012-01-19: qty 1500

## 2012-01-19 MED ORDER — MAGNESIUM SULFATE 50 % IJ SOLN
40.0000 meq | INTRAMUSCULAR | Status: DC
  Filled 2012-01-19: qty 10

## 2012-01-19 MED ORDER — POTASSIUM CHLORIDE 2 MEQ/ML IV SOLN
80.0000 meq | INTRAVENOUS | Status: DC
  Filled 2012-01-19: qty 40

## 2012-01-19 MED ORDER — SODIUM CHLORIDE 0.9 % IV SOLN
INTRAVENOUS | Status: AC
  Administered 2012-01-20: 1 [IU]/h via INTRAVENOUS
  Administered 2012-01-20: 4.4 [IU]/h via INTRAVENOUS
  Filled 2012-01-19: qty 1

## 2012-01-19 MED ORDER — DEXMEDETOMIDINE HCL IN NACL 400 MCG/100ML IV SOLN
0.1000 ug/kg/h | INTRAVENOUS | Status: AC
  Administered 2012-01-20: 0.2 ug/kg/h via INTRAVENOUS
  Filled 2012-01-19: qty 100

## 2012-01-19 MED ORDER — DOPAMINE-DEXTROSE 3.2-5 MG/ML-% IV SOLN
2.0000 ug/kg/min | INTRAVENOUS | Status: DC
  Filled 2012-01-19: qty 250

## 2012-01-19 MED ORDER — SODIUM CHLORIDE 0.9 % IV SOLN
INTRAVENOUS | Status: AC
  Administered 2012-01-20: 69.8 mL/h via INTRAVENOUS
  Filled 2012-01-19: qty 40

## 2012-01-19 MED ORDER — NITROGLYCERIN IN D5W 200-5 MCG/ML-% IV SOLN
2.0000 ug/min | INTRAVENOUS | Status: AC
  Administered 2012-01-20: 5 ug/min via INTRAVENOUS
  Filled 2012-01-19: qty 250

## 2012-01-19 MED ORDER — DEXTROSE 5 % IV SOLN
1.5000 g | INTRAVENOUS | Status: AC
  Administered 2012-01-20: .75 g via INTRAVENOUS
  Administered 2012-01-20: 1.5 g via INTRAVENOUS
  Filled 2012-01-19: qty 1.5

## 2012-01-19 MED ORDER — PLASMA-LYTE 148 IV SOLN
INTRAVENOUS | Status: AC
  Administered 2012-01-20: 09:00:00
  Filled 2012-01-19: qty 2.5

## 2012-01-20 ENCOUNTER — Inpatient Hospital Stay (HOSPITAL_COMMUNITY): Payer: Medicare Other | Admitting: Anesthesiology

## 2012-01-20 ENCOUNTER — Encounter (HOSPITAL_COMMUNITY): Payer: Self-pay | Admitting: *Deleted

## 2012-01-20 ENCOUNTER — Inpatient Hospital Stay (HOSPITAL_COMMUNITY)
Admission: RE | Admit: 2012-01-20 | Discharge: 2012-01-27 | DRG: 236 | Disposition: A | Discharge status: Skilled Nursing Facility | Payer: Medicare Other | Source: Ambulatory Visit | Attending: Surgery | Admitting: Surgery

## 2012-01-20 ENCOUNTER — Encounter (HOSPITAL_COMMUNITY): Payer: Self-pay | Admitting: Anesthesiology

## 2012-01-20 ENCOUNTER — Encounter (HOSPITAL_COMMUNITY)
Admission: RE | Disposition: A | Discharge status: Skilled Nursing Facility | Payer: Self-pay | Source: Ambulatory Visit | Attending: Surgery

## 2012-01-20 ENCOUNTER — Inpatient Hospital Stay (HOSPITAL_COMMUNITY): Payer: Medicare Other

## 2012-01-20 DIAGNOSIS — IMO0001 Reserved for inherently not codable concepts without codable children: Secondary | ICD-10-CM | POA: Diagnosis present

## 2012-01-20 DIAGNOSIS — Z951 Presence of aortocoronary bypass graft: Secondary | ICD-10-CM

## 2012-01-20 DIAGNOSIS — D62 Acute posthemorrhagic anemia: Secondary | ICD-10-CM | POA: Diagnosis present

## 2012-01-20 DIAGNOSIS — I509 Heart failure, unspecified: Secondary | ICD-10-CM | POA: Diagnosis present

## 2012-01-20 DIAGNOSIS — E119 Type 2 diabetes mellitus without complications: Secondary | ICD-10-CM | POA: Diagnosis present

## 2012-01-20 DIAGNOSIS — K3184 Gastroparesis: Secondary | ICD-10-CM | POA: Diagnosis present

## 2012-01-20 DIAGNOSIS — I209 Angina pectoris, unspecified: Secondary | ICD-10-CM

## 2012-01-20 DIAGNOSIS — I5032 Chronic diastolic (congestive) heart failure: Secondary | ICD-10-CM | POA: Diagnosis present

## 2012-01-20 DIAGNOSIS — Z85038 Personal history of other malignant neoplasm of large intestine: Secondary | ICD-10-CM

## 2012-01-20 DIAGNOSIS — Z9861 Coronary angioplasty status: Secondary | ICD-10-CM

## 2012-01-20 DIAGNOSIS — I10 Essential (primary) hypertension: Secondary | ICD-10-CM

## 2012-01-20 DIAGNOSIS — M1990 Unspecified osteoarthritis, unspecified site: Secondary | ICD-10-CM | POA: Diagnosis present

## 2012-01-20 DIAGNOSIS — I2510 Atherosclerotic heart disease of native coronary artery without angina pectoris: Principal | ICD-10-CM | POA: Diagnosis present

## 2012-01-20 DIAGNOSIS — E785 Hyperlipidemia, unspecified: Secondary | ICD-10-CM | POA: Diagnosis present

## 2012-01-20 DIAGNOSIS — Z8249 Family history of ischemic heart disease and other diseases of the circulatory system: Secondary | ICD-10-CM

## 2012-01-20 DIAGNOSIS — F411 Generalized anxiety disorder: Secondary | ICD-10-CM | POA: Diagnosis present

## 2012-01-20 DIAGNOSIS — K219 Gastro-esophageal reflux disease without esophagitis: Secondary | ICD-10-CM | POA: Diagnosis present

## 2012-01-20 HISTORY — PX: ENDOVEIN HARVEST OF GREATER SAPHENOUS VEIN: SHX5059

## 2012-01-20 HISTORY — PX: CORONARY ARTERY BYPASS GRAFT: SHX141

## 2012-01-20 LAB — CBC
HCT: 30.7 % — ABNORMAL LOW (ref 36.0–46.0)
HCT: 33.3 % — ABNORMAL LOW (ref 36.0–46.0)
Hemoglobin: 10.8 g/dL — ABNORMAL LOW (ref 12.0–15.0)
Hemoglobin: 11.8 g/dL — ABNORMAL LOW (ref 12.0–15.0)
MCHC: 35.4 g/dL (ref 30.0–36.0)
MCV: 89.3 fL (ref 78.0–100.0)
WBC: 12.4 10*3/uL — ABNORMAL HIGH (ref 4.0–10.5)
WBC: 15.5 10*3/uL — ABNORMAL HIGH (ref 4.0–10.5)

## 2012-01-20 LAB — GLUCOSE, CAPILLARY
Glucose-Capillary: 84 mg/dL (ref 70–99)
Glucose-Capillary: 152 mg/dL — ABNORMAL HIGH (ref 70–99)
Glucose-Capillary: 154 mg/dL — ABNORMAL HIGH (ref 70–99)
Glucose-Capillary: 184 mg/dL — ABNORMAL HIGH (ref 70–99)

## 2012-01-20 LAB — POCT I-STAT 3, ART BLOOD GAS (G3+)
Acid-base deficit: 1 mmol/L (ref 0.0–2.0)
Acid-base deficit: 1 mmol/L (ref 0.0–2.0)
Bicarbonate: 25.4 mEq/L — ABNORMAL HIGH (ref 20.0–24.0)
O2 Saturation: 100 %
O2 Saturation: 98 %
O2 Saturation: 93 %
Patient temperature: 32.7
TCO2: 27 mmol/L (ref 0–100)
pCO2 arterial: 44.8 mmHg (ref 35.0–45.0)
pCO2 arterial: 48.9 mmHg — ABNORMAL HIGH (ref 35.0–45.0)
pCO2 arterial: 49 mmHg — ABNORMAL HIGH (ref 35.0–45.0)
pH, Arterial: 7.321 — ABNORMAL LOW (ref 7.350–7.450)
pO2, Arterial: 80 mmHg (ref 80.0–100.0)
pO2, Arterial: 124 mmHg — ABNORMAL HIGH (ref 80.0–100.0)
pO2, Arterial: 72 mmHg — ABNORMAL LOW (ref 80.0–100.0)

## 2012-01-20 LAB — POCT I-STAT 3, VENOUS BLOOD GAS (G3P V)
Acid-base deficit: 2 mmol/L (ref 0.0–2.0)
Bicarbonate: 23.9 mEq/L (ref 20.0–24.0)
O2 Saturation: 72 %
Patient temperature: 32.5
TCO2: 25 mmol/L (ref 0–100)

## 2012-01-20 LAB — POCT I-STAT, CHEM 8
BUN: 15 mg/dL (ref 6–23)
Chloride: 107 mEq/L (ref 96–112)
Creatinine, Ser: 0.9 mg/dL (ref 0.50–1.10)
Hemoglobin: 11.6 g/dL — ABNORMAL LOW (ref 12.0–15.0)
Potassium: 4.5 mEq/L (ref 3.5–5.1)
Sodium: 140 mEq/L (ref 135–145)

## 2012-01-20 LAB — POCT I-STAT 4, (NA,K, GLUC, HGB,HCT)
Glucose, Bld: 119 mg/dL — ABNORMAL HIGH (ref 70–99)
HCT: 25 % — ABNORMAL LOW (ref 36.0–46.0)
Hemoglobin: 10.9 g/dL — ABNORMAL LOW (ref 12.0–15.0)
Hemoglobin: 11.9 g/dL — ABNORMAL LOW (ref 12.0–15.0)
Hemoglobin: 8.5 g/dL — ABNORMAL LOW (ref 12.0–15.0)
Hemoglobin: 6.8 g/dL — CL (ref 12.0–15.0)
Hemoglobin: 10.9 g/dL — ABNORMAL LOW (ref 12.0–15.0)
Potassium: 4 mEq/L (ref 3.5–5.1)
Sodium: 139 mEq/L (ref 135–145)
Sodium: 138 mEq/L (ref 135–145)
Sodium: 139 mEq/L (ref 135–145)
Sodium: 141 mEq/L (ref 135–145)

## 2012-01-20 LAB — CREATININE, SERUM
GFR calc Af Amer: >90 mL/min (ref >90)
GFR calc non Af Amer: 88 mL/min — ABNORMAL LOW (ref >90)

## 2012-01-20 LAB — PROTIME-INR: INR: 1.22 (ref 0.00–1.49)

## 2012-01-20 LAB — APTT: aPTT: 31 seconds (ref 24–37)

## 2012-01-20 LAB — POCT I-STAT GLUCOSE: Operator id: 3390

## 2012-01-20 SURGERY — CORONARY ARTERY BYPASS GRAFTING (CABG)
Anesthesia: General | Site: Leg Upper | Laterality: Right | Wound class: Clean

## 2012-01-20 MED ORDER — PANTOPRAZOLE SODIUM 40 MG PO TBEC
40.0000 mg | DELAYED_RELEASE_TABLET | Freq: Every day | ORAL | Status: DC
  Administered 2012-01-22 – 2012-01-23 (×2): 40 mg via ORAL
  Filled 2012-01-20 (×2): qty 1

## 2012-01-20 MED ORDER — MAGNESIUM SULFATE 40 MG/ML IJ SOLN
4.0000 g | Freq: Once | INTRAMUSCULAR | Status: AC
  Administered 2012-01-20: 4 g via INTRAVENOUS
  Filled 2012-01-20: qty 100

## 2012-01-20 MED ORDER — MIDAZOLAM HCL 2 MG/2ML IJ SOLN: 2.0000 mg | INTRAMUSCULAR | Status: DC | PRN

## 2012-01-20 MED ORDER — METOPROLOL TARTRATE 12.5 MG HALF TABLET: 12.5000 mg | ORAL_TABLET | Freq: Once | ORAL | Status: DC

## 2012-01-20 MED ORDER — PROTAMINE SULFATE 10 MG/ML IV SOLN
INTRAVENOUS | Status: DC | PRN
  Administered 2012-01-20: 60 mg via INTRAVENOUS
  Administered 2012-01-20: 20 mg via INTRAVENOUS
  Administered 2012-01-20 (×3): 100 mg via INTRAVENOUS

## 2012-01-20 MED ORDER — ONDANSETRON HCL 4 MG/2ML IJ SOLN
4.0000 mg | Freq: Four times a day (QID) | INTRAMUSCULAR | Status: DC | PRN
  Administered 2012-01-22: 4 mg via INTRAVENOUS
  Filled 2012-01-20: qty 2

## 2012-01-20 MED ORDER — ASPIRIN EC 325 MG PO TBEC
325.0000 mg | DELAYED_RELEASE_TABLET | Freq: Every day | ORAL | Status: DC
  Administered 2012-01-22 – 2012-01-23 (×2): 325 mg via ORAL
  Filled 2012-01-20 (×3): qty 1

## 2012-01-20 MED ORDER — FAMOTIDINE IN NACL 20-0.9 MG/50ML-% IV SOLN
20.0000 mg | Freq: Two times a day (BID) | INTRAVENOUS | Status: AC
  Administered 2012-01-20 – 2012-01-21 (×2): 20 mg via INTRAVENOUS
  Filled 2012-01-20 (×2): qty 50

## 2012-01-20 MED ORDER — VECURONIUM BROMIDE 10 MG IV SOLR
INTRAVENOUS | Status: DC | PRN
  Administered 2012-01-20 (×2): 3 mg via INTRAVENOUS
  Administered 2012-01-20: 10 mg via INTRAVENOUS
  Administered 2012-01-20: 7 mg via INTRAVENOUS

## 2012-01-20 MED ORDER — GLYCOPYRROLATE 0.2 MG/ML IJ SOLN
INTRAMUSCULAR | Status: DC | PRN
  Administered 2012-01-20 (×2): 0.2 mg via INTRAVENOUS

## 2012-01-20 MED ORDER — METOPROLOL TARTRATE 12.5 MG HALF TABLET
12.5000 mg | ORAL_TABLET | Freq: Two times a day (BID) | ORAL | Status: DC
  Filled 2012-01-20 (×3): qty 1

## 2012-01-20 MED ORDER — SODIUM CHLORIDE 0.9 % IJ SOLN
3.0000 mL | INTRAMUSCULAR | Status: DC | PRN
  Administered 2012-01-20: 10 mL via INTRAVENOUS

## 2012-01-20 MED ORDER — ROCURONIUM BROMIDE 100 MG/10ML IV SOLN
INTRAVENOUS | Status: DC | PRN
  Administered 2012-01-20: 50 mg via INTRAVENOUS

## 2012-01-20 MED ORDER — DEXMEDETOMIDINE HCL IN NACL 200 MCG/50ML IV SOLN
0.1000 ug/kg/h | INTRAVENOUS | Status: DC
  Administered 2012-01-20: 0.7 ug/kg/h via INTRAVENOUS
  Filled 2012-01-20: qty 50

## 2012-01-20 MED ORDER — THROMBIN 20000 UNITS EX SOLR
CUTANEOUS | Status: AC
  Filled 2012-01-20: qty 20000

## 2012-01-20 MED ORDER — LACTATED RINGERS IV SOLN
INTRAVENOUS | Status: DC | PRN
  Administered 2012-01-20: 08:00:00 via INTRAVENOUS

## 2012-01-20 MED ORDER — ACETAMINOPHEN 500 MG PO TABS
1000.0000 mg | ORAL_TABLET | Freq: Four times a day (QID) | ORAL | Status: DC
  Administered 2012-01-21: 1000 mg via ORAL
  Filled 2012-01-20 (×5): qty 2

## 2012-01-20 MED ORDER — 0.9 % SODIUM CHLORIDE (POUR BTL) OPTIME
TOPICAL | Status: DC | PRN
  Administered 2012-01-20: 6000 mL

## 2012-01-20 MED ORDER — ACETAMINOPHEN 160 MG/5ML PO SOLN
975.0000 mg | Freq: Four times a day (QID) | ORAL | Status: DC
  Administered 2012-01-20: 975 mg
  Filled 2012-01-20: qty 40.6

## 2012-01-20 MED ORDER — SIMVASTATIN 10 MG PO TABS
10.0000 mg | ORAL_TABLET | Freq: Every day | ORAL | Status: DC
  Administered 2012-01-21 – 2012-01-26 (×6): 10 mg via ORAL
  Filled 2012-01-20 (×8): qty 1

## 2012-01-20 MED ORDER — METOCLOPRAMIDE HCL 5 MG/ML IJ SOLN
10.0000 mg | Freq: Four times a day (QID) | INTRAMUSCULAR | Status: AC
  Administered 2012-01-20 – 2012-01-21 (×4): 10 mg via INTRAVENOUS
  Filled 2012-01-20 (×4): qty 2

## 2012-01-20 MED ORDER — VANCOMYCIN HCL IN DEXTROSE 1-5 GM/200ML-% IV SOLN
1000.0000 mg | Freq: Once | INTRAVENOUS | Status: AC
  Administered 2012-01-20: 1000 mg via INTRAVENOUS
  Filled 2012-01-20: qty 200

## 2012-01-20 MED ORDER — ASPIRIN 81 MG PO CHEW
324.0000 mg | CHEWABLE_TABLET | Freq: Every day | ORAL | Status: DC
  Administered 2012-01-21: 324 mg
  Filled 2012-01-20: qty 4

## 2012-01-20 MED ORDER — CHLORHEXIDINE GLUCONATE CLOTH 2 % EX PADS
6.0000 | MEDICATED_PAD | Freq: Every day | CUTANEOUS | Status: DC
  Administered 2012-01-20 – 2012-01-22 (×3): 6 via TOPICAL

## 2012-01-20 MED ORDER — INSULIN REGULAR BOLUS VIA INFUSION
0.0000 [IU] | Freq: Three times a day (TID) | INTRAVENOUS | Status: DC
  Administered 2012-01-21: 2 [IU] via INTRAVENOUS
  Filled 2012-01-20: qty 10

## 2012-01-20 MED ORDER — SODIUM CHLORIDE 0.9 % IV SOLN
INTRAVENOUS | Status: DC
  Administered 2012-01-20: 3.5 [IU]/h via INTRAVENOUS
  Administered 2012-01-21: 5.3 [IU]/h via INTRAVENOUS
  Administered 2012-01-21: 3.1 [IU]/h via INTRAVENOUS
  Administered 2012-01-22: 3.5 [IU]/h via INTRAVENOUS
  Filled 2012-01-20 (×2): qty 1

## 2012-01-20 MED ORDER — MIDAZOLAM HCL 5 MG/5ML IJ SOLN
INTRAMUSCULAR | Status: DC | PRN
  Administered 2012-01-20: 1 mg via INTRAVENOUS
  Administered 2012-01-20: 3 mg via INTRAVENOUS
  Administered 2012-01-20 (×2): 2 mg via INTRAVENOUS
  Administered 2012-01-20 (×2): 1 mg via INTRAVENOUS
  Administered 2012-01-20: 3 mg via INTRAVENOUS
  Administered 2012-01-20: 1 mg via INTRAVENOUS
  Administered 2012-01-20: 2 mg via INTRAVENOUS

## 2012-01-20 MED ORDER — MORPHINE SULFATE 2 MG/ML IJ SOLN: 1.0000 mg | INTRAMUSCULAR | Status: DC | PRN

## 2012-01-20 MED ORDER — LACTATED RINGERS IV SOLN
INTRAVENOUS | Status: DC
  Administered 2012-01-20: 14:00:00 via INTRAVENOUS

## 2012-01-20 MED ORDER — HEPARIN SODIUM (PORCINE) 1000 UNIT/ML IJ SOLN
INTRAMUSCULAR | Status: DC | PRN
  Administered 2012-01-20: 39000 [IU] via INTRAVENOUS

## 2012-01-20 MED ORDER — NITROGLYCERIN IN D5W 200-5 MCG/ML-% IV SOLN
0.0000 ug/min | INTRAVENOUS | Status: DC
  Administered 2012-01-20: 3 ug/min via INTRAVENOUS

## 2012-01-20 MED ORDER — SODIUM CHLORIDE 0.45 % IV SOLN
INTRAVENOUS | Status: DC
  Administered 2012-01-20: 14:00:00 via INTRAVENOUS

## 2012-01-20 MED ORDER — FENTANYL CITRATE 0.05 MG/ML IJ SOLN
INTRAMUSCULAR | Status: DC | PRN
  Administered 2012-01-20: 50 ug via INTRAVENOUS
  Administered 2012-01-20: 250 ug via INTRAVENOUS
  Administered 2012-01-20: 400 ug via INTRAVENOUS
  Administered 2012-01-20: 50 ug via INTRAVENOUS
  Administered 2012-01-20: 350 ug via INTRAVENOUS
  Administered 2012-01-20: 50 ug via INTRAVENOUS
  Administered 2012-01-20 (×2): 250 ug via INTRAVENOUS
  Administered 2012-01-20 (×2): 50 ug via INTRAVENOUS

## 2012-01-20 MED ORDER — FLUOXETINE HCL 20 MG PO CAPS
20.0000 mg | ORAL_CAPSULE | Freq: Three times a day (TID) | ORAL | Status: DC
  Administered 2012-01-20 – 2012-01-27 (×20): 20 mg via ORAL
  Filled 2012-01-20 (×23): qty 1

## 2012-01-20 MED ORDER — DOCUSATE SODIUM 100 MG PO CAPS
200.0000 mg | ORAL_CAPSULE | Freq: Every day | ORAL | Status: DC
  Administered 2012-01-21 – 2012-01-23 (×3): 200 mg via ORAL
  Filled 2012-01-20 (×3): qty 2

## 2012-01-20 MED ORDER — THROMBIN 20000 UNITS EX SOLR
OROMUCOSAL | Status: DC | PRN
  Administered 2012-01-20 (×2): via TOPICAL

## 2012-01-20 MED ORDER — ALBUMIN HUMAN 5 % IV SOLN
INTRAVENOUS | Status: DC | PRN
  Administered 2012-01-20: 12:00:00 via INTRAVENOUS

## 2012-01-20 MED ORDER — HEMOSTATIC AGENTS (NO CHARGE) OPTIME
TOPICAL | Status: DC | PRN
  Administered 2012-01-20: 1 via TOPICAL

## 2012-01-20 MED ORDER — BISACODYL 10 MG RE SUPP: 10.0000 mg | Freq: Every day | RECTAL | Status: DC

## 2012-01-20 MED ORDER — THROMBIN 20000 UNITS EX KIT
PACK | CUTANEOUS | Status: DC | PRN
  Administered 2012-01-20: 20000 [IU] via TOPICAL

## 2012-01-20 MED ORDER — BISACODYL 5 MG PO TBEC
10.0000 mg | DELAYED_RELEASE_TABLET | Freq: Every day | ORAL | Status: DC
  Administered 2012-01-21 – 2012-01-23 (×3): 10 mg via ORAL
  Filled 2012-01-20 (×3): qty 2

## 2012-01-20 MED ORDER — CHLORHEXIDINE GLUCONATE 4 % EX LIQD: 30.0000 mL | CUTANEOUS | Status: DC

## 2012-01-20 MED ORDER — PHENYLEPHRINE HCL 10 MG/ML IJ SOLN
0.0000 ug/min | INTRAVENOUS | Status: DC
  Administered 2012-01-20: 8 ug/min via INTRAVENOUS
  Filled 2012-01-20: qty 2

## 2012-01-20 MED ORDER — ACETAMINOPHEN 10 MG/ML IV SOLN
1000.0000 mg | Freq: Once | INTRAVENOUS | Status: AC
  Administered 2012-01-20: 1000 mg via INTRAVENOUS
  Filled 2012-01-20: qty 100

## 2012-01-20 MED ORDER — MORPHINE SULFATE 2 MG/ML IJ SOLN: 2.0000 mg | INTRAMUSCULAR | Status: DC | PRN

## 2012-01-20 MED ORDER — LACTATED RINGERS IV SOLN: 500.0000 mL | Freq: Once | INTRAVENOUS | Status: AC | PRN

## 2012-01-20 MED ORDER — SODIUM CHLORIDE 0.9 % IV SOLN: 250.0000 mL | INTRAVENOUS | Status: DC

## 2012-01-20 MED ORDER — LACTATED RINGERS IV SOLN
INTRAVENOUS | Status: DC | PRN
  Administered 2012-01-20: 07:00:00 via INTRAVENOUS

## 2012-01-20 MED ORDER — ARTIFICIAL TEARS OP OINT
TOPICAL_OINTMENT | OPHTHALMIC | Status: DC | PRN
  Administered 2012-01-20: 1 via OPHTHALMIC

## 2012-01-20 MED ORDER — MUPIROCIN 2 % EX OINT
TOPICAL_OINTMENT | Freq: Two times a day (BID) | CUTANEOUS | Status: DC
  Administered 2012-01-20: 1 via NASAL
  Administered 2012-01-21 – 2012-01-23 (×5): via NASAL
  Filled 2012-01-20: qty 22

## 2012-01-20 MED ORDER — POTASSIUM CHLORIDE 10 MEQ/50ML IV SOLN: 10.0000 meq | INTRAVENOUS | Status: AC

## 2012-01-20 MED ORDER — OXYCODONE HCL 5 MG PO TABS: 5.0000 mg | ORAL_TABLET | ORAL | Status: DC | PRN

## 2012-01-20 MED ORDER — METOPROLOL TARTRATE 1 MG/ML IV SOLN: 2.5000 mg | INTRAVENOUS | Status: DC | PRN

## 2012-01-20 MED ORDER — SODIUM CHLORIDE 0.9 % IV SOLN
INTRAVENOUS | Status: DC
  Administered 2012-01-20 – 2012-01-21 (×2): via INTRAVENOUS
  Administered 2012-01-22: 20 mL/h via INTRAVENOUS

## 2012-01-20 MED ORDER — SODIUM CHLORIDE 0.9 % IJ SOLN
3.0000 mL | Freq: Two times a day (BID) | INTRAMUSCULAR | Status: DC
  Administered 2012-01-21: 3 mL via INTRAVENOUS
  Administered 2012-01-21: 22:00:00 via INTRAVENOUS
  Administered 2012-01-22 (×2): 3 mL via INTRAVENOUS

## 2012-01-20 MED ORDER — PHENYLEPHRINE HCL 10 MG/ML IJ SOLN
INTRAMUSCULAR | Status: DC | PRN
  Administered 2012-01-20 (×2): 20 ug via INTRAVENOUS
  Administered 2012-01-20 (×3): 40 ug via INTRAVENOUS

## 2012-01-20 MED ORDER — METOPROLOL TARTRATE 25 MG/10 ML ORAL SUSPENSION
12.5000 mg | Freq: Two times a day (BID) | ORAL | Status: DC
  Filled 2012-01-20 (×3): qty 5

## 2012-01-20 MED ORDER — PROPOFOL 10 MG/ML IV BOLUS
INTRAVENOUS | Status: DC | PRN
  Administered 2012-01-20: 150 mg via INTRAVENOUS
  Administered 2012-01-20: 50 mg via INTRAVENOUS

## 2012-01-20 MED ORDER — FENTANYL CITRATE 0.05 MG/ML IJ SOLN
50.0000 ug | INTRAMUSCULAR | Status: DC | PRN
  Administered 2012-01-21 – 2012-01-22 (×4): 50 ug via INTRAVENOUS
  Filled 2012-01-20 (×3): qty 2

## 2012-01-20 MED ORDER — ALBUMIN HUMAN 5 % IV SOLN
250.0000 mL | INTRAVENOUS | Status: AC | PRN
  Administered 2012-01-20 (×2): 250 mL via INTRAVENOUS

## 2012-01-20 SURGICAL SUPPLY — 103 items
ATTRACTOMAT 16X20 MAGNETIC DRP (DRAPES) ×3 IMPLANT
BAG DECANTER FOR FLEXI CONT (MISCELLANEOUS) ×3 IMPLANT
BANDAGE ELASTIC 4 VELCRO ST LF (GAUZE/BANDAGES/DRESSINGS) ×3 IMPLANT
BANDAGE ELASTIC 6 VELCRO ST LF (GAUZE/BANDAGES/DRESSINGS) ×3 IMPLANT
BANDAGE GAUZE ELAST BULKY 4 IN (GAUZE/BANDAGES/DRESSINGS) ×3 IMPLANT
BASKET HEART (ORDER IN 25'S) (MISCELLANEOUS) ×1
BASKET HEART (ORDER IN 25S) (MISCELLANEOUS) ×2 IMPLANT
BLADE STERNUM SYSTEM 6 (BLADE) ×3 IMPLANT
BLADE SURG 11 STRL SS (BLADE) ×3 IMPLANT
CANISTER SUCTION 2500CC (MISCELLANEOUS) ×3 IMPLANT
CATH ROBINSON RED A/P 18FR (CATHETERS) ×6 IMPLANT
CATH THORACIC 28FR (CATHETERS) ×6 IMPLANT
CATH THORACIC 28FR RT ANG (CATHETERS) IMPLANT
CATH THORACIC 36FR (CATHETERS) ×3 IMPLANT
CATH THORACIC 36FR RT ANG (CATHETERS) ×3 IMPLANT
CLIP TI MEDIUM 24 (CLIP) IMPLANT
CLIP TI WIDE RED SMALL 24 (CLIP) ×1 IMPLANT
CLOTH BEACON ORANGE TIMEOUT ST (SAFETY) ×3 IMPLANT
COVER SURGICAL LIGHT HANDLE (MISCELLANEOUS) ×3 IMPLANT
CRADLE DONUT ADULT HEAD (MISCELLANEOUS) ×3 IMPLANT
DRAPE CARDIOVASCULAR INCISE (DRAPES) ×3
DRAPE SLUSH MACHINE 52X66 (DRAPES) IMPLANT
DRAPE SLUSH/WARMER DISC (DRAPES) ×1 IMPLANT
DRAPE SRG 135X102X78XABS (DRAPES) ×2 IMPLANT
DRSG COVADERM 4X14 (GAUZE/BANDAGES/DRESSINGS) ×3 IMPLANT
ELECT CAUTERY BLADE 6.4 (BLADE) ×3 IMPLANT
ELECT REM PT RETURN 9FT ADLT (ELECTROSURGICAL) ×6
ELECTRODE REM PT RTRN 9FT ADLT (ELECTROSURGICAL) ×4 IMPLANT
GLOVE BIO SURGEON STRL SZ 6 (GLOVE) IMPLANT
GLOVE BIO SURGEON STRL SZ 6.5 (GLOVE) IMPLANT
GLOVE BIO SURGEON STRL SZ7 (GLOVE) IMPLANT
GLOVE BIO SURGEON STRL SZ7.5 (GLOVE) IMPLANT
GLOVE BIOGEL PI IND STRL 6 (GLOVE) IMPLANT
GLOVE BIOGEL PI IND STRL 6.5 (GLOVE) IMPLANT
GLOVE BIOGEL PI IND STRL 7.0 (GLOVE) IMPLANT
GLOVE BIOGEL PI IND STRL 8 (GLOVE) IMPLANT
GLOVE BIOGEL PI INDICATOR 6 (GLOVE) ×2
GLOVE BIOGEL PI INDICATOR 6.5 (GLOVE) ×2
GLOVE BIOGEL PI INDICATOR 7.0 (GLOVE)
GLOVE BIOGEL PI INDICATOR 8 (GLOVE) ×2
GLOVE EUDERMIC 7 POWDERFREE (GLOVE) ×6 IMPLANT
GLOVE ORTHO TXT STRL SZ7.5 (GLOVE) IMPLANT
GLOVE SURG SS PI 6.0 STRL IVOR (GLOVE) ×4 IMPLANT
GLOVE SURG SS PI 6.5 STRL IVOR (GLOVE) ×3 IMPLANT
GLOVE SURG SS PI 7.0 STRL IVOR (GLOVE) ×6 IMPLANT
GOWN PREVENTION PLUS XLARGE (GOWN DISPOSABLE) ×3 IMPLANT
GOWN STRL NON-REIN LRG LVL3 (GOWN DISPOSABLE) ×14 IMPLANT
HEMOSTAT POWDER SURGIFOAM 1G (HEMOSTASIS) ×9 IMPLANT
HEMOSTAT SURGICEL 2X14 (HEMOSTASIS) ×3 IMPLANT
INSERT FOGARTY 61MM (MISCELLANEOUS) IMPLANT
INSERT FOGARTY XLG (MISCELLANEOUS) IMPLANT
KIT BASIN OR (CUSTOM PROCEDURE TRAY) ×3 IMPLANT
KIT CATH CPB BARTLE (MISCELLANEOUS) ×3 IMPLANT
KIT ROOM TURNOVER OR (KITS) ×3 IMPLANT
KIT SUCTION CATH 14FR (SUCTIONS) ×3 IMPLANT
KIT VASOVIEW W/TROCAR VH 2000 (KITS) ×3 IMPLANT
NS IRRIG 1000ML POUR BTL (IV SOLUTION) ×15 IMPLANT
PACK OPEN HEART (CUSTOM PROCEDURE TRAY) ×3 IMPLANT
PAD ARMBOARD 7.5X6 YLW CONV (MISCELLANEOUS) ×6 IMPLANT
PENCIL BUTTON HOLSTER BLD 10FT (ELECTRODE) ×3 IMPLANT
PUNCH AORTIC ROTATE 4.0MM (MISCELLANEOUS) IMPLANT
PUNCH AORTIC ROTATE 4.5MM 8IN (MISCELLANEOUS) ×3 IMPLANT
PUNCH AORTIC ROTATE 5MM 8IN (MISCELLANEOUS) IMPLANT
SOLUTION ANTI FOG 6CC (MISCELLANEOUS) ×2 IMPLANT
SPONGE GAUZE 4X4 12PLY (GAUZE/BANDAGES/DRESSINGS) ×6 IMPLANT
SPONGE INTESTINAL PEANUT (DISPOSABLE) IMPLANT
SPONGE LAP 18X18 X RAY DECT (DISPOSABLE) ×2 IMPLANT
SPONGE LAP 4X18 X RAY DECT (DISPOSABLE) ×3 IMPLANT
SUT BONE WAX W31G (SUTURE) ×3 IMPLANT
SUT MNCRL AB 4-0 PS2 18 (SUTURE) ×1 IMPLANT
SUT PROLENE 3 0 SH DA (SUTURE) IMPLANT
SUT PROLENE 3 0 SH1 36 (SUTURE) ×3 IMPLANT
SUT PROLENE 4 0 RB 1 (SUTURE)
SUT PROLENE 4 0 SH DA (SUTURE) IMPLANT
SUT PROLENE 4-0 RB1 .5 CRCL 36 (SUTURE) IMPLANT
SUT PROLENE 5 0 C 1 36 (SUTURE) IMPLANT
SUT PROLENE 6 0 C 1 30 (SUTURE) IMPLANT
SUT PROLENE 7 0 BV 1 (SUTURE) IMPLANT
SUT PROLENE 7 0 BV1 MDA (SUTURE) ×3 IMPLANT
SUT PROLENE 8 0 BV175 6 (SUTURE) IMPLANT
SUT SILK  1 MH (SUTURE) ×1
SUT SILK 1 MH (SUTURE) IMPLANT
SUT SILK 2 0 SH CR/8 (SUTURE) ×1 IMPLANT
SUT STEEL STERNAL CCS#1 18IN (SUTURE) IMPLANT
SUT STEEL SZ 6 DBL 3X14 BALL (SUTURE) ×3 IMPLANT
SUT VIC AB 1 CTX 36 (SUTURE) ×9
SUT VIC AB 1 CTX36XBRD ANBCTR (SUTURE) ×4 IMPLANT
SUT VIC AB 2-0 CT1 27 (SUTURE) ×3
SUT VIC AB 2-0 CT1 TAPERPNT 27 (SUTURE) IMPLANT
SUT VIC AB 2-0 CTX 27 (SUTURE) IMPLANT
SUT VIC AB 3-0 SH 27 (SUTURE)
SUT VIC AB 3-0 SH 27X BRD (SUTURE) IMPLANT
SUT VIC AB 3-0 X1 27 (SUTURE) IMPLANT
SUT VICRYL 4-0 PS2 18IN ABS (SUTURE) IMPLANT
SUTURE E-PAK OPEN HEART (SUTURE) ×3 IMPLANT
SYSTEM SAHARA CHEST DRAIN ATS (WOUND CARE) ×4 IMPLANT
TOWEL OR 17X24 6PK STRL BLUE (TOWEL DISPOSABLE) ×3 IMPLANT
TOWEL OR 17X26 10 PK STRL BLUE (TOWEL DISPOSABLE) ×3 IMPLANT
TRAY FOLEY IC TEMP SENS 14FR (CATHETERS) ×3 IMPLANT
TUBE SUCT INTRACARD DLP 20F (MISCELLANEOUS) ×3 IMPLANT
TUBING INSUFFLATION 10FT LAP (TUBING) ×3 IMPLANT
UNDERPAD 30X30 INCONTINENT (UNDERPADS AND DIAPERS) ×3 IMPLANT
WATER STERILE IRR 1000ML POUR (IV SOLUTION) ×6 IMPLANT

## 2012-01-20 NOTE — Plan of Care (Signed)
Problem: Phase II Progression Outcomes Goal: Tolerates weaning with O2 Sat > 90 Outcome: Progressing Now on 40 and 4

## 2012-01-20 NOTE — Anesthesia Preprocedure Evaluation (Signed)
Anesthesia Evaluation  Patient identified by MRN, date of birth, ID band Patient awake    Reviewed: Allergy & Precautions, H&P , NPO status , Patient's Chart, lab work & pertinent test results  Airway Mallampati: III  Neck ROM: full    Dental   Pulmonary asthma , sleep apnea ,          Cardiovascular hypertension, + angina + CAD and + Cardiac Stents     Neuro/Psych  Headaches, Anxiety Depression    GI/Hepatic hiatal hernia, GERD-  ,  Endo/Other  diabetes, Type obesity  Renal/GU      Musculoskeletal  (+) Fibromyalgia -  Abdominal   Peds  Hematology   Anesthesia Other Findings   Reproductive/Obstetrics                           Anesthesia Physical Anesthesia Plan  ASA: III  Anesthesia Plan: General   Post-op Pain Management:    Induction: Intravenous  Airway Management Planned: Oral ETT  Additional Equipment: Arterial line, CVP and PA Cath  Intra-op Plan:   Post-operative Plan: Post-operative intubation/ventilation  Informed Consent: I have reviewed the patients History and Physical, chart, labs and discussed the procedure including the risks, benefits and alternatives for the proposed anesthesia with the patient or authorized representative who has indicated his/her understanding and acceptance.     Plan Discussed with: CRNA and Surgeon  Anesthesia Plan Comments:         Anesthesia Quick Evaluation

## 2012-01-20 NOTE — Progress Notes (Signed)
Pt returned back to full support. Pt unable to follow commands, very sleepy, will reevaluate.

## 2012-01-20 NOTE — Progress Notes (Signed)
Pt extubated without difficulty.

## 2012-01-20 NOTE — Progress Notes (Signed)
Pt is weaning per protocol

## 2012-01-20 NOTE — Anesthesia Procedure Notes (Signed)
Procedure Name: Intubation Date/Time: 01/20/2012 8:05 AM Performed by: Gayla Medicus Pre-anesthesia Checklist: Patient identified, Timeout performed, Emergency Drugs available, Suction available and Patient being monitored Patient Re-evaluated:Patient Re-evaluated prior to inductionOxygen Delivery Method: Circle system utilized Preoxygenation: Pre-oxygenation with 100% oxygen Intubation Type: IV induction Ventilation: Mask ventilation without difficulty and Oral airway inserted - appropriate to patient size Laryngoscope Size: Mac and 3 Grade View: Grade II Tube type: Oral Tube size: 8.0 mm Number of attempts: 1 Airway Equipment and Method: Stylet Placement Confirmation: ETT inserted through vocal cords under direct vision,  positive ETCO2 and breath sounds checked- equal and bilateral Secured at: 22 cm Tube secured with: Tape Dental Injury: Teeth and Oropharynx as per pre-operative assessment

## 2012-01-20 NOTE — Preoperative (Signed)
Beta Blockers   Reason not to administer Beta Blockers:Not Applicable 

## 2012-01-20 NOTE — Brief Op Note (Signed)
01/20/2012  11:26 AM  PATIENT:  [name]  66 y.o. female  PRE-OPERATIVE DIAGNOSIS:  CAD  POST-OPERATIVE DIAGNOSIS:  Coronary artery disease  PROCEDURE:  Procedure(s) (LRB) with comments:  CORONARY ARTERY BYPASS GRAFTING x 3 -LIMA to LAD -SVG to Diagonal 1 -SVG to Right Coronary  ENDOVEIN HARVEST OF GREATER SAPHENOUS VEIN (Right)  SURGEON:  Surgeon(s) and Role:    * Alleen Borne, MD - Primary  PHYSICIAN ASSISTANT: Brixon Zhen PA-C  ANESTHESIA:   general  EBL:  Total I/O In: -  Out: 400 [Urine:400]  BLOOD ADMINISTERED: CC CELLSAVER  DRAINS: Left pleural chest tube, Mediastinal chest drain   LOCAL MEDICATIONS USED:  NONE  SPECIMEN:  No Specimen  DISPOSITION OF SPECIMEN:  N/A  COUNTS:  YES  TOURNIQUET:  * No tourniquets in log *  DICTATION: .Dragon Dictation  PLAN OF CARE: Admit to inpatient   PATIENT DISPOSITION:  ICU - intubated and hemodynamically stable.   Delay start of Pharmacological VTE agent (>24hrs) due to surgical blood loss or risk of bleeding: yes

## 2012-01-20 NOTE — Interval H&P Note (Signed)
History and Physical Interval Note:  01/20/2012 7:24 AM  [name]  has presented today for surgery, with the diagnosis of CAD  The various methods of treatment have been discussed with the patient and family. After consideration of risks, benefits and other options for treatment, the patient has consented to  Procedure(s) (LRB) with comments: CORONARY ARTERY BYPASS GRAFTING (CABG) (N/A) as a surgical intervention .  The patient's history has been reviewed, patient examined, no change in status, stable for surgery.  I have reviewed the patient's chart and labs.  Questions were answered to the patient's satisfaction.     Alleen Borne

## 2012-01-20 NOTE — H&P (Signed)
                  [address]            [place] [address]          [phone]      PCP is FAGAN,ROY, MD Referring Provider is Cooper, Michael, MD  Chief Complaint  Patient presents with  . Coronary Artery Disease    Referral from Dr Cooper for surgical eval on CAD, cardiac cath 11/13/11    HPI:  The patient is a 66-year-old woman with a history of diabetes, hypertension, and dyslipidemia who also has history of coronary disease and underwent stenting of the LAD in 2006 with subsequent kissing balloon PCI of the LAD and diagonal with insertion of a second drug-eluting stent into the LAD in July of 2010. She was subsequently diagnosed with colon cancer and underwent right colon resection. She apparently had positive lymph nodes and was treated with chemotherapy. She had been followed by Dr. Karb until he retired. She now presents with recurrent symptoms of chest pain and shortness of breath with minimal exertion. Cardiac catheterization showed severe 2 vessel coronary disease involving the LAD and first diagonal branch with a 70% mid right coronary stenosis that is unchanged from her previous study. Of note, she had some varicose veins removed from her left lower leg.  Past Medical History  Diagnosis Date  . Coronary artery disease     a. s/p stent to lad 2006 w subsequent kissing balloon pci to diagonal;  b.  pci of the lad w a drug-eluting stent July 2010;  c.  LHC 11/13/11: p-mLAD stent with prox 50% ISR, 80% beyond dist edge of stent, m+dLAD beyond stent 40-50%, mod caliber pD1 80%, mRCA 70%, m-dRCA 20-30%, EF 55-65% => med Rx (consider CABG)  . Diabetes mellitus   . Hypertension   . Dyslipidemia   . Morbid obesity   . GERD (gastroesophageal reflux disease)   . Varicose vein   . Gastroparesis   . Osteoarthritis   . Blurred vision   . Acoustic neuroma     left ear  . Anxiety   . Depressed   . Asthma     AS CHILD   . Sleep apnea     12+ YRS NO MACHINE  GOTTEN   . Anginal pain     DR COOPER Imboden    . Headache   . Fibromyalgia   . Hiatal hernia   . Adenocarcinoma     ascending colon, LNs pos -- S/P right hemicolectomy, xeloda completed 6/10 (dr. Karb)    Past Surgical History  Procedure Date  . Cataract extraction   . Fetal surgery for congenital hernia   . Knee arthroscopy   . Replacement total knee   . Tonsillectomy   . Hemicolectomy 12/28/07    right for adenocarcinoma of ascending colon  . Hysterotomy 1993  . Herniaorraphy 01/22/06    for recurrent ventral hernia  . Laparotomy     for small bowl obstruction  . Resecton of acoustic neuroma   . Stent surgery   . Abdominal hysterectomy 1993  . Cholecystectomy 1966  . Hernia repair 1990, 2002, 2007    open x1, lap x2   . Colon surgery   . Portacath placement   . Excision morton's neuroma   . Appendectomy   . Cardiac catheterization   . Lesion removal 10/08/2011    Procedure: LESION REMOVAL;  Surgeon: Wayne Keeling, MD;    Location: [facility];  Service: Orthopedics;  Laterality: Right;  Removal Lesion Right Long Finger  . Back surgery   . Eye surgery     cataract on right 2005, left 2008  . Joint replacement 2005 and 2006    right 2005, left 2006  . Finger replantation     RECENTLY   . Mandible surgery     Family History  Problem Relation Age of Onset  . Coronary artery disease Other     family history  . Asthma Other     family history-grandfather  . Colon cancer Other     family hx  . Diabetes Maternal Grandmother     unspecif if Maternal or Paternal  . Heart failure Mother   . Cirrhosis Father     Social History History  Substance Use Topics  . Smoking status: Never Smoker   . Smokeless tobacco: Not on file  . Alcohol Use: No     Comment: occasional    Current Outpatient Prescriptions  Medication Sig Dispense Refill  . amLODipine (NORVASC) 5 MG tablet Take 1 tablet (5 mg total) by mouth daily.  30 tablet  11  . aspirin EC 81 MG tablet Take 81 mg  by mouth daily.      . FLUoxetine (PROZAC) 20 MG capsule Take 20 mg by mouth 3 (three) times daily.      . furosemide (LASIX) 20 MG tablet Take 1 tablet (20 mg total) by mouth daily. For fluid  30 tablet  11  . insulin glargine (LANTUS) 100 UNIT/ML injection Inject 40 Units into the skin 2 (two) times daily.       . Insulin Lispro, Human, (HUMALOG PEN Pope) Inject 15 Units into the skin 2 (two) times daily.       . losartan (COZAAR) 50 MG tablet Take 50 mg by mouth daily.      . metFORMIN (GLUCOPHAGE) 500 MG tablet Take 1,000 mg by mouth 2 (two) times daily.      . methylcellulose (ARTIFICIAL TEARS) 1 % ophthalmic solution Place 1 drop into both eyes as needed. For dry eyes      . metoprolol (LOPRESSOR) 50 MG tablet Take 1 tablet (50 mg total) by mouth 2 (two) times daily.  60 tablet  3  . Multiple Vitamin (MULTIVITAMIN WITH MINERALS) TABS Take 1 tablet by mouth daily.      . naproxen sodium (ANAPROX) 220 MG tablet Take 220 mg by mouth every 8 (eight) hours as needed. For pain      . nitroGLYCERIN (NITROSTAT) 0.4 MG SL tablet Place 0.4 mg under the tongue every 5 (five) minutes as needed. As needed for chest pain. May repeat times 3        . pravastatin (PRAVACHOL) 20 MG tablet Take 20 mg by mouth daily.      . trolamine salicylate (ASPERCREME) 10 % cream Apply 1 application topically as needed. For pain in knees       No current facility-administered medications for this visit.   Facility-Administered Medications Ordered in Other Visits  Medication Dose Route Frequency Provider Last Rate Last Dose  . [COMPLETED] albuterol (PROVENTIL) (5 MG/ML) 0.5% nebulizer solution 2.5 mg  2.5 mg Nebulization Once Bryan K Bartle, MD   2.5 mg at 01/16/12 1259    Allergies  Allergen Reactions  . Latex Itching    Only where touched on body with it.  . Morphine Shortness Of Breath, Itching and Other (See Comments)    Makes patient   feel as if her "insides" are on fire.  . Codeine Itching and Rash    All  over body.  . Penicillins Itching and Rash    Localized.    Review of Systems  Constitutional: Positive for activity change, appetite change, fatigue and unexpected weight change.  HENT: Positive for hearing loss.   Eyes: Positive for visual disturbance.       Floaters  Respiratory: Positive for cough and shortness of breath.   Cardiovascular: Positive for chest pain and palpitations.  Gastrointestinal: Negative.   Genitourinary: Negative.   Musculoskeletal: Positive for myalgias, joint swelling, arthralgias and gait problem.  Skin: Negative.   Neurological: Positive for headaches.       Chronic pain Memory disturbance neuropathy  Hematological: Negative.   Psychiatric/Behavioral: Positive for dysphoric mood. The patient is nervous/anxious.     BP 148/76  Pulse 108  Resp 20  Ht 5' 5.5" (1.664 m)  Wt 252 lb (114.306 kg)  BMI 41.30 kg/m2  SpO2 94% Physical Exam  Constitutional: She is oriented to person, place, and time.       Obese white female in no distress  HENT:  Head: Normocephalic and atraumatic.  Mouth/Throat: Oropharynx is clear and moist.  Eyes: Conjunctivae normal and EOM are normal. Pupils are equal, round, and reactive to light.  Neck: Normal range of motion. Neck supple. No JVD present. No thyromegaly present.  Cardiovascular: Normal rate, regular rhythm, normal heart sounds and intact distal pulses.  Exam reveals no gallop and no friction rub.   No murmur heard. Pulmonary/Chest: Effort normal and breath sounds normal. No respiratory distress. She has no rales.  Abdominal: Soft. Bowel sounds are normal. She exhibits no distension and no mass. There is no tenderness.  Musculoskeletal: She exhibits no edema.  Lymphadenopathy:    She has no cervical adenopathy.  Neurological: She is alert and oriented to person, place, and time. She has normal strength. No cranial nerve deficit or sensory deficit.  Skin: Skin is warm and dry.  Psychiatric: She has a normal  mood and affect.     Diagnostic Tests:  Cardiac Cath:  Procedural Findings: Hemodynamics: AO 162/71 LV 173/29  Coronary angiography: Coronary dominance: right  Left mainstem: Patent without obstructive CAD  Left anterior descending (LAD): The LAD is diffusely diseased. The stented segment throughout the proximal to mid LAD has diffuse disease. The most proximal aspect of the stent demonstrates 50% in-stent restenosis. The distal stented segment extending beyond the distal stent edge has 80% stenosis across a long segment until the LAD normalizes in the mid vessel. The mid and distal LAD beyond the stented segment has scattered 40-50% stenosis. The first diagonal branch which is of moderate caliber as 80% proximal stenosis.  Left circumflex (LCx): The left circumflex is patent throughout. The first OM is a large vessel with mild nonobstructive plaque the second OM and third OM branches are both small.  Right coronary artery (RCA): The RCA is a large, dominant vessel. The mid vessel has 70% stenosis. This is unchanged from the previous study. Otherwise there is minor nonobstructive smooth plaque of no more than 20-30% stenosis in the mid and distal vessel. The PDA and PLA branches are all patent.  Left ventriculography: Left ventricular systolic function is normal, LVEF is estimated at 55-65%, there is no significant mitral regurgitation   Final Conclusions:   1. Severe single-vessel coronary artery disease involving the LAD and first diagonal branches 2. Moderate right coronary artery stenosis, unchanged from the   previous study 3. Widely patent left mainstem and left circumflex vessels 4. Normal left ventricular function  Recommendations: We will need to review revascularization options. 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 I think her most durable long-term treatment strategy would be a LIMA to LAD and graft  to the first diagonal. However, I'm not sure that she will be a reasonable surgical candidate. I would like to see her back in the office for further discussion of treatment strategies. The downside of stenting is that the patient has type 2 diabetes, recurrent in-stent restenosis, and our he has overlapping drug-eluting stents in the LAD.  Michael Cooper 11/13/2011, 1:57 PM    Impression:  She has severe two-vessel coronary disease with worsening anginal symptoms occurring with minimal exertion around her house. I agree that coronary bypass graft surgery is the best treatment to prevent further ischemia and infarction and improve her quality of life. I discussed the operative procedure with the patient and family including alternatives, benefits and risks; including but not limited to bleeding, blood transfusion, infection, stroke, myocardial infarction, graft failure, heart block requiring a permanent pacemaker, organ dysfunction, and death.  [name] understands and agrees to proceed.    Plan:  I will perform CABG on Monday 01/20/2012. 

## 2012-01-20 NOTE — Progress Notes (Signed)
Patient ID: INANNA TELFORD, female   DOB: November 26, 1945, 66 y.o.   MRN: [id number]                   [address]            [place] [address]          [phone]     Day of Surgery Procedure(s) (LRB): CORONARY ARTERY BYPASS GRAFTING (CABG) (N/A) ENDOVEIN HARVEST OF GREATER SAPHENOUS VEIN (Right)  Total Length of Stay:  LOS: 0 days  BP 97/49  Pulse 79  Temp 98.6 F (37 C) (Core (Comment))  Resp 20  Ht 5\' 5"  (1.651 m)  Wt 241 lb 13.5 oz (109.7 kg)  BMI 40.25 kg/m2  SpO2 100%  .Intake/Output      11/25 0701 - 11/26 0700   I.V. (mL/kg) 3398.3 (31)   Blood 453   NG/GT 30   IV Piggyback 952   Total Intake(mL/kg) 4833.3 (44.1)   Urine (mL/kg/hr) 1950 (1.3)   Blood 2136   Chest Tube 110   Total Output 4196   Net +637.3            . sodium chloride 20 mL/hr at 01/20/12 1330  . sodium chloride 20 mL/hr at 01/20/12 1500  . sodium chloride    . dexmedetomidine Stopped (01/20/12 1747)  . insulin (NOVOLIN-R) infusion 4 Units/hr (01/20/12 2000)  . lactated ringers 40 mL/hr at 01/20/12 1600  . nitroGLYCERIN Stopped (01/20/12 1500)  . phenylephrine (NEO-SYNEPHRINE) Adult infusion 3 mcg/min (01/20/12 1833)     Lab Results  Component Value Date   WBC 15.5* 01/20/2012   HGB 11.6* 01/20/2012   HCT 34.0* 01/20/2012   PLT 159 01/20/2012   GLUCOSE 140* 01/20/2012   CHOL  Value: 151        ATP III CLASSIFICATION:  <200     mg/dL   Desirable  469-629  mg/dL   Borderline High  >=528    mg/dL   High 06/08/2438   TRIG 216* 08/06/2007   HDL 33* 08/06/2007   LDLCALC  Value: 75        Total Cholesterol/HDL:CHD Risk Coronary Heart Disease Risk Table                     Men   Women  1/2 Average Risk   3.4   3.3 08/06/2007   ALT 28 01/16/2012   AST 34 01/16/2012   NA 140 01/20/2012   K 4.5 01/20/2012   CL 107 01/20/2012   CREATININE 0.90 01/20/2012   BUN 15 01/20/2012   CO2 24 01/16/2012   TSH 3.996 Test methodology is 3rd generation TSH 08/05/2007   INR 1.22 01/20/2012     HGBA1C 7.6* 01/16/2012   Still asleep on vent Hemodynamics stable Not bleeding  Delight Ovens MD  Beeper [phone] Office [phone] 01/20/2012 8:50 PM

## 2012-01-20 NOTE — OR Nursing (Signed)
Dr Laneta Simmers began at [phone]

## 2012-01-20 NOTE — Transfer of Care (Signed)
Immediate Anesthesia Transfer of Care Note  Patient: [name]  Procedure(s) Performed: Procedure(s) (LRB) with comments: CORONARY ARTERY BYPASS GRAFTING (CABG) (N/A) - Coronary artery bypass graft times three utilizing the left internal mammary artery and the right greater saphenous vein harvested endoscopically ENDOVEIN HARVEST OF GREATER SAPHENOUS VEIN (Right)  Patient Location: SICU  Anesthesia Type:General  Level of Consciousness: sedated and Patient remains intubated per anesthesia plan  Airway & Oxygen Therapy: Patient remains intubated per anesthesia plan and Patient placed on Ventilator (see vital sign flow sheet for setting)  Post-op Assessment: Report given to PACU RN and Post -op Vital signs reviewed and stable  Post vital signs: Reviewed and stable  Complications: No apparent anesthesia complications

## 2012-01-20 NOTE — Procedures (Signed)
Extubation Procedure Note  Patient Details:   Name: [name] DOB: Sep 03, 1945 MRN: [id number]   Airway Documentation:  Pt. Extubated following wean protocol. Pt able to verbalize. Pt placed on 3L [place] @ 100%. Pt has clear/diminished BBS, HR 80, BP 111/55. Pt stable at this time.  Evaluation  O2 sats: stable throughout Complications: No apparent complications Patient did tolerate procedure well. Bilateral Breath Sounds: Diminished;Clear   Yes  Elmer Picker 01/20/2012, 10:00 PM

## 2012-01-21 ENCOUNTER — Inpatient Hospital Stay (HOSPITAL_COMMUNITY): Payer: Medicare Other

## 2012-01-21 ENCOUNTER — Encounter (HOSPITAL_COMMUNITY): Payer: Self-pay | Admitting: Surgery

## 2012-01-21 LAB — GLUCOSE, CAPILLARY
Glucose-Capillary: 103 mg/dL — ABNORMAL HIGH (ref 70–99)
Glucose-Capillary: 104 mg/dL — ABNORMAL HIGH (ref 70–99)
Glucose-Capillary: 106 mg/dL — ABNORMAL HIGH (ref 70–99)
Glucose-Capillary: 119 mg/dL — ABNORMAL HIGH (ref 70–99)
Glucose-Capillary: 124 mg/dL — ABNORMAL HIGH (ref 70–99)
Glucose-Capillary: 145 mg/dL — ABNORMAL HIGH (ref 70–99)
Glucose-Capillary: 103 mg/dL — ABNORMAL HIGH (ref 70–99)
Glucose-Capillary: 108 mg/dL — ABNORMAL HIGH (ref 70–99)
Glucose-Capillary: 91 mg/dL (ref 70–99)
Glucose-Capillary: 162 mg/dL — ABNORMAL HIGH (ref 70–99)
Glucose-Capillary: 261 mg/dL — ABNORMAL HIGH (ref 70–99)

## 2012-01-21 LAB — BASIC METABOLIC PANEL
CO2: 27 mEq/L (ref 19–32)
Chloride: 107 mEq/L (ref 96–112)
Sodium: 141 mEq/L (ref 135–145)

## 2012-01-21 LAB — CBC
HCT: 32.8 % — ABNORMAL LOW (ref 36.0–46.0)
Hemoglobin: 10.8 g/dL — ABNORMAL LOW (ref 12.0–15.0)
Hemoglobin: 11.1 g/dL — ABNORMAL LOW (ref 12.0–15.0)
MCH: 30.7 pg (ref 26.0–34.0)
MCV: 90.6 fL (ref 78.0–100.0)
Platelets: 162 10*3/uL (ref 150–400)
RBC: 3.49 MIL/uL — ABNORMAL LOW (ref 3.87–5.11)
RBC: 3.62 MIL/uL — ABNORMAL LOW (ref 3.87–5.11)
WBC: 12.6 10*3/uL — ABNORMAL HIGH (ref 4.0–10.5)

## 2012-01-21 LAB — POCT I-STAT, CHEM 8
Creatinine, Ser: 0.8 mg/dL (ref 0.50–1.10)
Glucose, Bld: 285 mg/dL — ABNORMAL HIGH (ref 70–99)
HCT: 32 % — ABNORMAL LOW (ref 36.0–46.0)
Hemoglobin: 10.9 g/dL — ABNORMAL LOW (ref 12.0–15.0)
Potassium: 4 mEq/L (ref 3.5–5.1)
Sodium: 138 mEq/L (ref 135–145)
TCO2: 24 mmol/L (ref 0–100)

## 2012-01-21 LAB — MAGNESIUM: Magnesium: 2.5 mg/dL (ref 1.5–2.5)

## 2012-01-21 MED ORDER — METOPROLOL TARTRATE 25 MG PO TABS
25.0000 mg | ORAL_TABLET | Freq: Two times a day (BID) | ORAL | Status: DC
  Administered 2012-01-21 – 2012-01-23 (×5): 25 mg via ORAL
  Filled 2012-01-21 (×6): qty 1

## 2012-01-21 MED ORDER — METOPROLOL TARTRATE 25 MG/10 ML ORAL SUSPENSION
25.0000 mg | Freq: Two times a day (BID) | ORAL | Status: DC
  Filled 2012-01-21 (×6): qty 10

## 2012-01-21 MED ORDER — INSULIN ASPART 100 UNIT/ML ~~LOC~~ SOLN
0.0000 [IU] | SUBCUTANEOUS | Status: DC
  Administered 2012-01-21: 4 [IU] via SUBCUTANEOUS

## 2012-01-21 MED ORDER — ACETAMINOPHEN 500 MG PO TABS
1000.0000 mg | ORAL_TABLET | Freq: Four times a day (QID) | ORAL | Status: DC | PRN
  Administered 2012-01-21 – 2012-01-26 (×4): 1000 mg via ORAL
  Filled 2012-01-21 (×3): qty 2

## 2012-01-21 MED ORDER — POTASSIUM CHLORIDE CRYS ER 20 MEQ PO TBCR
40.0000 meq | EXTENDED_RELEASE_TABLET | Freq: Two times a day (BID) | ORAL | Status: AC
  Administered 2012-01-21 (×2): 40 meq via ORAL
  Filled 2012-01-21 (×2): qty 1
  Filled 2012-01-21: qty 2

## 2012-01-21 MED ORDER — FUROSEMIDE 10 MG/ML IJ SOLN
40.0000 mg | Freq: Two times a day (BID) | INTRAMUSCULAR | Status: DC
  Administered 2012-01-21 – 2012-01-23 (×5): 40 mg via INTRAVENOUS
  Filled 2012-01-21 (×6): qty 4

## 2012-01-21 MED ORDER — INSULIN GLARGINE 100 UNIT/ML ~~LOC~~ SOLN
40.0000 [IU] | Freq: Two times a day (BID) | SUBCUTANEOUS | Status: DC
  Administered 2012-01-21 (×2): 40 [IU] via SUBCUTANEOUS

## 2012-01-21 MED ORDER — ENOXAPARIN SODIUM 40 MG/0.4ML ~~LOC~~ SOLN
40.0000 mg | SUBCUTANEOUS | Status: DC
  Administered 2012-01-21 – 2012-01-26 (×6): 40 mg via SUBCUTANEOUS
  Filled 2012-01-21 (×7): qty 0.4

## 2012-01-21 MED FILL — Sodium Chloride IV Soln 0.9%: INTRAVENOUS | Qty: 1000 | Status: AC

## 2012-01-21 MED FILL — Sodium Chloride Irrigation Soln 0.9%: Qty: 3000 | Status: AC

## 2012-01-21 MED FILL — Heparin Sodium (Porcine) Inj 1000 Unit/ML: INTRAMUSCULAR | Qty: 10 | Status: AC

## 2012-01-21 MED FILL — Lidocaine HCl IV Inj 20 MG/ML: INTRAVENOUS | Qty: 5 | Status: AC

## 2012-01-21 MED FILL — Electrolyte-R (PH 7.4) Solution: INTRAVENOUS | Qty: 4000 | Status: AC

## 2012-01-21 MED FILL — Magnesium Sulfate Inj 50%: INTRAMUSCULAR | Qty: 10 | Status: AC

## 2012-01-21 MED FILL — Heparin Sodium (Porcine) Inj 1000 Unit/ML: INTRAMUSCULAR | Qty: 30 | Status: AC

## 2012-01-21 MED FILL — Potassium Chloride Inj 2 mEq/ML: INTRAVENOUS | Qty: 40 | Status: AC

## 2012-01-21 NOTE — Clinical Documentation Improvement (Signed)
Anemia Blood Loss Clarification  THIS DOCUMENT IS NOT A PERMANENT PART OF THE MEDICAL RECORD  RESPOND TO THE THIS QUERY, FOLLOW THE INSTRUCTIONS BELOW:  1. If needed, update documentation for the patient's encounter via the notes activity.  2. Access this query again and click edit on the In [employer].  3. After updating, or not, click F2 to complete all highlighted (required) fields concerning your review. Select "additional documentation in the medical record" OR "no additional documentation provided".  4. Click Sign note button.  5. The deficiency will fall out of your In Basket *Please let us know if you are not able to complete this workflow by phone or e-mail (listed below).        01/21/12  Dear Dr.Tomeko Scoville[facility]  In an effort to better capture your patient's severity of illness, reflect appropriate length of stay and utilization of resources, a review of the patient medical record has revealed the following indicators.    Based on your clinical judgment, please clarify and document in a progress note and/or discharge summary the clinical condition associated with the following supporting information:  In responding to this query please exercise your independent judgment.  The fact that a query is asked, does not imply that any particular answer is desired or expected.   Possible Clinical Conditions?   " Expected Acute Blood Loss Anemia  " Acute Blood Loss Anemia  " Acute on chronic blood loss anemia  " Precipitous drop in Hematocrit  " Other Condition  " Cannot Clinically Determine    Supporting Information: EBL: per 11/25 Anesthesia record   Diagnostics: H&H on 11/21: 13.7/39.9 H&H on 11/25:   6.8/20.0  Transfusion: IV fluids / plasma expanders: Cell saver: per 11/25 Anesthesia record Albumin,human,5%: x2 per 11/25 MAR   Reviewed: additional documentation in the medical record  Thank Patsy Lager,   Clinical Documentation Specialist:  Pager: [phone]  Phone: [phone]  Health Information Management [place]

## 2012-01-21 NOTE — Progress Notes (Signed)
UR Completed.  Carter Kaman Jane [phone] 01/21/2012  

## 2012-01-21 NOTE — Significant Event (Signed)
1630-Pt last CBG=371, restarted back on insulin drip per gluco-stabilizer. Will monitor. Danira Nylander, Charity fundraiser.

## 2012-01-21 NOTE — Anesthesia Postprocedure Evaluation (Signed)
  Anesthesia Post-op Note  Patient: [name]  Procedure(s) Performed: Procedure(s) (LRB) with comments: CORONARY ARTERY BYPASS GRAFTING (CABG) (N/A) - Coronary artery bypass graft times three utilizing the left internal mammary artery and the right greater saphenous vein harvested endoscopically ENDOVEIN HARVEST OF GREATER SAPHENOUS VEIN (Right)  Patient Location: ICU  Anesthesia Type:General  Level of Consciousness: awake, alert  and oriented  Airway and Oxygen Therapy: Patient Spontanous Breathing and Patient connected to nasal cannula oxygen  Post-op Pain: none  Post-op Assessment: Post-op Vital signs reviewed, Patient's Cardiovascular Status Stable, Respiratory Function Stable, Patent Airway, No signs of Nausea or vomiting, Adequate PO intake and Pain level controlled  Post-op Vital Signs: Reviewed and stable  Complications: No apparent anesthesia complications

## 2012-01-21 NOTE — Progress Notes (Addendum)
1 Day Post-Op Procedure(s) (LRB): CORONARY ARTERY BYPASS GRAFTING (CABG) (N/A) ENDOVEIN HARVEST OF GREATER SAPHENOUS VEIN (Right) Subjective: No complaints, feels great  Objective: Vital signs in last 24 hours: Temp:  [95.7 F (35.4 C)-99.5 F (37.5 C)] 99.1 F (37.3 C) (11/26 0700) Pulse Rate:  [71-88] 86  (11/26 0700) Cardiac Rhythm:  [-] Atrial paced (11/26 0000) Resp:  [7-25] 15  (11/26 0700) BP: (87-141)/(41-98) 141/51 mmHg (11/26 0700) SpO2:  [96 %-100 %] 99 % (11/26 0700) Arterial Line BP: (61-138)/(49-67) 61/57 mmHg (11/25 2021) FiO2 (%):  [40 %-50 %] 40 % (11/25 2051) Weight:  [109.7 kg (241 lb 13.5 oz)-114.6 kg (252 lb 10.4 oz)] 114.6 kg (252 lb 10.4 oz) (11/26 0500)  Hemodynamic parameters for last 24 hours: PAP: (23-42)/(9-25) 41/24 mmHg CO:  [4.1 L/min-6.2 L/min] 6.2 L/min CI:  [1.9 L/min/m2-2.9 L/min/m2] 2.9 L/min/m2  Intake/Output from previous day: 11/25 0701 - 11/26 0700 In: 5853.2 [I.V.:4104.2; Blood:453; NG/GT:90; IV Piggyback:1206] Out: 5086 [Urine:2480; Emesis/NG output:100; Blood:2136; Chest Tube:370] Intake/Output this shift:    General appearance: alert and cooperative Neurologic: intact Heart: regular rate and rhythm, S1, S2 normal, no murmur, click, rub or gallop Lungs: clear to auscultation bilaterally Extremities: edema mild Wound: dressing dry  Lab Results:  Basename 01/21/12 0500 01/20/12 1934 01/20/12 1929  WBC 12.6* -- 15.5*  HGB 10.8* 11.6* --  HCT 31.1* 34.0* --  PLT 162 -- 159   BMET:  Basename 01/21/12 0500 01/20/12 1934  NA 141 140  [name] 4.1 4.5  CL 107 107  CO2 27 --  GLUCOSE 101* 140*  BUN 14 15  CREATININE 0.76 0.90  CALCIUM 8.1* --    PT/INR:  Basename 01/20/12 1330  LABPROT 15.2  INR 1.22   ABG    Component Value Date/Time   PHART 7.326* 01/20/2012 2305   HCO3 25.4* 01/20/2012 2305   TCO2 27 01/20/2012 2305   ACIDBASEDEF 1.0 01/20/2012 2305   O2SAT 93.0 01/20/2012 2305   CBG (last 3)   Basename 01/21/12  0720 01/21/12 0602 01/21/12 0444  GLUCAP 106* 103* 104*   CXR: left base atelectasis  ECG: NSR. No acute changes  Assessment/Plan: S/P Procedure(s) (LRB): CORONARY ARTERY BYPASS GRAFTING (CABG) (N/A) ENDOVEIN HARVEST OF GREATER SAPHENOUS VEIN (Right) Mobilize Diuresis Diabetes control d/c tubes/lines Continue foley due to patient in ICU and urinary output monitoring See progression orders   LOS: 1 day    [name] 01/21/2012

## 2012-01-21 NOTE — Progress Notes (Signed)
Pt. Performed NIF 38, VC 850 ML's  prior to extubation.

## 2012-01-21 NOTE — Op Note (Signed)
[name], [name]                ACCOUNT NO.:  [id number]  MEDICAL RECORD NO.:  [id number]  LOCATION:  2305                         FACILITY:  [facility]  PHYSICIAN:  Evelene Croon, M.D.     DATE OF BIRTH:  1945-09-10  DATE OF PROCEDURE:  01/20/2012 DATE OF DISCHARGE:                              OPERATIVE REPORT   PREOPERATIVE DIAGNOSIS:  Severe 2-vessel coronary artery disease.  POSTOPERATIVE DIAGNOSIS:  Severe 2-vessel coronary artery disease.  OPERATIVE PROCEDURE:  Median sternotomy, extracorporeal circulation, coronary artery bypass graft surgery x3 using a left internal mammary artery graft to left anterior descending coronary artery, with a saphenous vein graft to diagonal branch of the LAD and the right coronary artery.  Endoscopic vein harvesting from the right leg.  ATTENDING SURGEON:  Evelene Croon, MD  ASSISTANT:  Pauline Good, PA-C  ANESTHESIA:  General endotracheal.  CLINICAL HISTORY:  This patient is a 66 year old woman with history of obesity, diabetes, hypertension, and dyslipidemia as well as coronary artery disease, who underwent stenting of the LAD in 2006 and subsequent kissing balloon PCI of the LAD and diagonal with insertion of a 2nd drug- eluting stent in the LAD in July of 2010.  She now presents with recurrent symptoms of chest pain and shortness of breath with minimal exertion.  Cardiac catheterization showed severe 2-vessel coronary artery disease involving the LAD/diagonal and the right coronary artery. The LAD was stented throughout the proximal to mid portion and diffuse disease within this area.  The most proximal aspect of the stent demonstrated about 50% in-stent restenosis.  The distal stented segment extending beyond the distal stent edge had about 80% stenosis.  Beyond this, the mid and distal LAD had no significant stenosis.  Diagonal branch had about 80% proximal stenosis.  The left circumflex had no disease.  The right coronary artery had  50% midvessel stenosis.  Left ventricular ejection fraction was 55-65% with no mitral regurgitation. After review of the catheterization and examination of the patient, it was felt that coronary artery bypass graft surgery is the best treatment to prevent further ischemia and infarction and improve her quality of life.  I discussed the operative procedure with the patient including alternatives, benefits, and risks including, but not limited to, bleeding, blood transfusion, infection, stroke, myocardial infarction, graft failure, organ dysfunction, and death.  She understood and agreed to proceed.  OPERATIVE PROCEDURE:  The patient was taken to the operating room and placed on table in supine position.  After induction of general endotracheal anesthesia, a Foley catheter was placed in bladder using sterile technique.  Then, the chest, abdomen, and both lower extremities were prepped and draped in usual sterile manner.  The chest was entered through a median sternotomy incision.  The pericardium opened midline. Examination of the heart showed good ventricular contractility.  The ascending aorta had no palpable plaques in it.  Then, the left internal mammary artery was harvested from chest wall as pedicle graft.  This was a medium caliber vessel with excellent blood flow through it.  At the same time, a segment of greater saphenous vein was harvested from the right leg using endoscopic vein harvest technique.  This vein was  of medium size and good quality.  The patient was heparinized when an adequate ACT was obtained.  The distal ascending aorta was cannulated using a 20-French aortic cannula for arterial inflow.  Venous outflow was achieved using a 2-stage venous cannula for the right atrial appendage.  An antegrade cardioplegia and vent cannula was inserted in the aortic root.  The patient placed on cardiopulmonary bypass.  The distal coronary artery was identified.  The LAD was a  moderate-sized vessel in its mid and distal portion and there was no significant disease located here. The diagonal branch was moderate-sized vessel proximally before bifurcating 2 smaller branches.  The proximal portion was suitable for grafting.  The right coronary artery had mid vessel segmental plaque but beyond this appeared fairly normal.  Then the aorta was crossclamped and 700 mL of cold blood antegrade cardioplegia was administered in the aortic root with quick arrest of the heart.  Systemic hypothermia to 20 degrees centigrade and topical hypothermia with iced saline was used.  Temperature probe was placed in septum insulating pad in the pericardium.  The first distal anastomosis was performed to the right coronary artery. The internal diameter adjacent to the takeoff of the posterior descending branch was about 2 mm.  The conduit used was a segment of greater saphenous vein and the anastomosis performed in end-to-side manner using continuous 7-0 Prolene suture.  Flow was noted through the graft and was excellent.  The second distal anastomosis was performed to the diagonal branch.  The internal diameter proximally is 1.6 mm.  The conduit used was a second segment of greater saphenous vein and the anastomosis performed in end- to-side manner using continuous 7-0 Prolene suture.  Flow was noted through the graft and was excellent.  Then, another dose of cardioplegia was given down the vein grafts and aortic root.  A third distal anastomosis was performed to the distal LAD.  The internal diameter of this vessel was about 1.75 mm.  The conduit used was the left internal mammary graft and this was brought through an opening of the left pericardium anterior to the phrenic nerve.  It was anastomosed to the LAD in end-to-side manner using continuous 8-0 Prolene suture.  The pedicle was sutured to the epicardium with 6-0 Prolene sutures.  The patient was then rewarmed to 37  degrees centigrade.  With the crossclamp in place, 2 proximal vein graft anastomoses were performed to the mid ascending aorta in end-to-side manner using continuous 6-0 Prolene suture.  Then, the clamp was removed from the mammary pedicle.  There was rapid warming of the ventricular septum and return of spontaneous ventricular fibrillation.  Crossclamp was removed with a time of 58 minutes and the patient spontaneously converted to sinus rhythm.  The proximal and distal anastomoses appeared hemostatic and allowed the grafts satisfactory.  Graft marker was placed around the proximal anastomoses.  Two temporary right ventricular and right atrial pacing wires were placed and brought through the skin.  When the patient rewarmed to 37 degrees centigrade, she was weaned from cardiopulmonary bypass on no inotropic agents.  Total bypass time was 77 minutes.  Cardiac function appeared excellent with cardiac output of 5 liters/minute.  Protamine was given and the venous and aortic cannulas were removed without difficulty.  Hemostasis was achieved.  Four chest tubes were placed with bilateral pleural tubes, 2 in the posterior pericardium, 1 in the anterior mediastinum.  The sternum was then closed with double #6 stainless steel wires.  The fascia was  closed with continuous #1 Vicryl suture.  Subcutaneous tissue was closed with continuous 2-0 Vicryl and the skin with a 3-0 Vicryl subcuticular closure.  The lower extremity vein harvest site was closed in layers in a similar manner.  The sponge, needle, and instrument counts were correct according to the scrub nurse.  Dry sterile dressing was applied over the incisions around the chest tubes, which were hooked to Pleur-Evac suction.  The patient remained hemodynamically stable and was transported to the SICU in guarded, but stable condition.     Evelene Croon, M.D.     BB/MEDQ  D:  01/20/2012  T:  01/21/2012  Job:  [id number]

## 2012-01-21 NOTE — Progress Notes (Addendum)
POD # 1 CABG x 3  Sitting up in bed  BP 144/77  Pulse 70  Temp 98.2 F (36.8 C) (Oral)  Resp 18  Ht 5\' 5"  (1.651 m)  Wt 252 lb 10.4 oz (114.6 kg)  BMI 42.04 kg/m2  SpO2 98%   Intake/Output Summary (Last 24 hours) at 01/21/12 1823 Last data filed at 01/21/12 1808  Gross per 24 hour  Intake 1802.92 ml  Output   2585 ml  Net -782.08 ml   k 4.0 Creatinine 0.8 hct 32 Back on insulin gtt for CBG 371- now down to 285 Continue insulin gtt overnight Continue present care

## 2012-01-22 ENCOUNTER — Inpatient Hospital Stay (HOSPITAL_COMMUNITY): Payer: Medicare Other

## 2012-01-22 LAB — BASIC METABOLIC PANEL
BUN: 14 mg/dL (ref 6–23)
CO2: 27 mEq/L (ref 19–32)
Calcium: 8.8 mg/dL (ref 8.4–10.5)
Creatinine, Ser: 0.75 mg/dL (ref 0.50–1.10)
GFR calc Af Amer: >90 mL/min (ref >90)
GFR calc non Af Amer: 85 mL/min — ABNORMAL LOW (ref >90)
Glucose, Bld: 125 mg/dL — ABNORMAL HIGH (ref 70–99)
Glucose, Bld: 238 mg/dL — ABNORMAL HIGH (ref 70–99)
Potassium: 3.9 mEq/L (ref 3.5–5.1)
Sodium: 135 mEq/L (ref 135–145)

## 2012-01-22 LAB — GLUCOSE, CAPILLARY
Glucose-Capillary: 120 mg/dL — ABNORMAL HIGH (ref 70–99)
Glucose-Capillary: 128 mg/dL — ABNORMAL HIGH (ref 70–99)
Glucose-Capillary: 131 mg/dL — ABNORMAL HIGH (ref 70–99)
Glucose-Capillary: 133 mg/dL — ABNORMAL HIGH (ref 70–99)
Glucose-Capillary: 177 mg/dL — ABNORMAL HIGH (ref 70–99)
Glucose-Capillary: 126 mg/dL — ABNORMAL HIGH (ref 70–99)
Glucose-Capillary: 112 mg/dL — ABNORMAL HIGH (ref 70–99)
Glucose-Capillary: 134 mg/dL — ABNORMAL HIGH (ref 70–99)
Glucose-Capillary: 137 mg/dL — ABNORMAL HIGH (ref 70–99)
Glucose-Capillary: 173 mg/dL — ABNORMAL HIGH (ref 70–99)

## 2012-01-22 LAB — CBC
HCT: 31 % — ABNORMAL LOW (ref 36.0–46.0)
Hemoglobin: 10.7 g/dL — ABNORMAL LOW (ref 12.0–15.0)
MCH: 31.1 pg (ref 26.0–34.0)
MCHC: 34.5 g/dL (ref 30.0–36.0)
RDW: 14.4 % (ref 11.5–15.5)

## 2012-01-22 MED ORDER — INSULIN GLARGINE 100 UNIT/ML ~~LOC~~ SOLN
50.0000 [IU] | Freq: Two times a day (BID) | SUBCUTANEOUS | Status: DC
  Administered 2012-01-22 – 2012-01-23 (×3): 50 [IU] via SUBCUTANEOUS

## 2012-01-22 MED ORDER — INSULIN ASPART 100 UNIT/ML ~~LOC~~ SOLN
0.0000 [IU] | Freq: Three times a day (TID) | SUBCUTANEOUS | Status: DC
  Administered 2012-01-22: 7 [IU] via SUBCUTANEOUS
  Administered 2012-01-23: 11 [IU] via SUBCUTANEOUS
  Administered 2012-01-23: 4 [IU] via SUBCUTANEOUS

## 2012-01-22 MED ORDER — BOOST PLUS PO LIQD
237.0000 mL | Freq: Three times a day (TID) | ORAL | Status: DC
  Filled 2012-01-22 (×6): qty 237

## 2012-01-22 MED ORDER — INSULIN ASPART 100 UNIT/ML ~~LOC~~ SOLN
6.0000 [IU] | Freq: Three times a day (TID) | SUBCUTANEOUS | Status: DC
  Administered 2012-01-22 – 2012-01-23 (×4): 6 [IU] via SUBCUTANEOUS

## 2012-01-22 MED ORDER — POTASSIUM CHLORIDE 10 MEQ/50ML IV SOLN
10.0000 meq | INTRAVENOUS | Status: AC
Start: 2012-01-22 — End: 2012-01-22
  Administered 2012-01-22 (×4): 10 meq via INTRAVENOUS
  Filled 2012-01-22: qty 200

## 2012-01-22 MED ORDER — ENSURE COMPLETE PO LIQD: 237.0000 mL | Freq: Two times a day (BID) | ORAL | Status: DC

## 2012-01-22 MED ORDER — METOCLOPRAMIDE HCL 5 MG/ML IJ SOLN
10.0000 mg | Freq: Four times a day (QID) | INTRAMUSCULAR | Status: DC
  Administered 2012-01-22 – 2012-01-23 (×3): 10 mg via INTRAVENOUS
  Filled 2012-01-22 (×4): qty 2

## 2012-01-22 NOTE — Progress Notes (Addendum)
INITIAL ADULT NUTRITION ASSESSMENT Date: 01/22/2012   Time: 9:52 AM Reason for Assessment: MD Consult  INTERVENTION:  Boost Plus TID   DOCUMENTATION CODES  Per approved criteria   -Morbid Obesity    ASSESSMENT: Female 66 y.o.  Dx: CAD  Hx:  Past Medical History  Diagnosis Date  . Coronary artery disease     a. s/p stent to lad 2006 w subsequent kissing balloon pci to diagonal;  b.  pci of the lad w a drug-eluting stent July 2010;  c.  LHC 11/13/11: p-mLAD stent with prox 50% ISR, 80% beyond dist edge of stent, m+dLAD beyond stent 40-50%, mod caliber pD1 80%, mRCA 70%, m-dRCA 20-30%, EF 55-65% => med Rx (consider CABG)  . Diabetes mellitus   . Hypertension   . Dyslipidemia   . Morbid obesity   . GERD (gastroesophageal reflux disease)   . Varicose vein   . Gastroparesis   . Osteoarthritis   . Blurred vision   . Acoustic neuroma     left ear  . Anxiety   . Depressed   . Asthma     AS CHILD   . Sleep apnea     12+ YRS NO MACHINE GOTTEN   . Anginal pain     DR Gwynneth Macleod    . Headache   . Fibromyalgia   . Hiatal hernia   . Adenocarcinoma     ascending colon, LNs pos -- S/P right hemicolectomy, xeloda completed 6/10 (dr. Cleone Slim)  . Morbid obesity    Past Surgical History  Procedure Date  . Cataract extraction   . Fetal surgery for congenital hernia   . Knee arthroscopy   . Replacement total knee   . Tonsillectomy   . Hemicolectomy 12/28/07    right for adenocarcinoma of ascending colon  . Hysterotomy 1993  . Herniaorraphy 01/22/06    for recurrent ventral hernia  . Laparotomy     for small bowl obstruction  . Resecton of acoustic neuroma   . Stent surgery   . Abdominal hysterectomy 1993  . Cholecystectomy 1966  . Hernia repair 1990, 2002, 2007    open x1, lap x2   . Colon surgery   . Portacath placement   . Excision morton's neuroma   . Appendectomy   . Cardiac catheterization   . Lesion removal 10/08/2011    Procedure: LESION REMOVAL;  Surgeon:  Darreld Mclean, MD;  Location: [facility];  Service: Orthopedics;  Laterality: Right;  Removal Lesion Right Long Finger  . Back surgery   . Eye surgery     cataract on right 2005, left 2008  . Joint replacement 2005 and 2006    right 2005, left 2006  . Finger replantation     RECENTLY   . Mandible surgery   . Coronary artery bypass graft 01/20/2012    Procedure: CORONARY ARTERY BYPASS GRAFTING (CABG);  Surgeon: Alleen Borne, MD;  Location: [facility] OR;  Service: Open Heart Surgery;  Laterality: N/A;  Coronary artery bypass graft times three utilizing the left internal mammary artery and the right greater saphenous vein harvested endoscopically  . Endovein harvest of greater saphenous vein 01/20/2012    Procedure: ENDOVEIN HARVEST OF GREATER SAPHENOUS VEIN;  Surgeon: Alleen Borne, MD;  Location: [facility] OR;  Service: Open Heart Surgery;  Laterality: Right;    Related Meds:  Scheduled Meds:   . aspirin EC  325 mg Oral Daily   Or  . aspirin  324 mg Per Tube Daily  .  bisacodyl  10 mg Oral Daily   Or  . bisacodyl  10 mg Rectal Daily  . Chlorhexidine Gluconate Cloth  6 each Topical Daily  . docusate sodium  200 mg Oral Daily  . enoxaparin (LOVENOX) injection  40 mg Subcutaneous Q24H  . [COMPLETED] famotidine (PEPCID) IV  20 mg Intravenous Q12H  . FLUoxetine  20 mg Oral TID  . furosemide  40 mg Intravenous BID  . insulin aspart  6 Units Subcutaneous TID WC  . insulin glargine  50 Units Subcutaneous BID  . insulin regular  0-10 Units Intravenous TID WC  . [COMPLETED] metoCLOPramide (REGLAN) injection  10 mg Intravenous Q6H  . metoCLOPramide (REGLAN) injection  10 mg Intravenous Q6H  . metoprolol tartrate  25 mg Oral BID   Or  . metoprolol tartrate  25 mg Per Tube BID  . mupirocin ointment   Nasal BID  . pantoprazole  40 mg Oral Daily  . potassium chloride  10 mEq Intravenous Q1 Hr x 4  . [COMPLETED] potassium chloride  40 mEq Oral BID  . simvastatin  10 mg Oral q1800  . sodium chloride  3 mL  Intravenous Q12H  . [DISCONTINUED] insulin aspart  0-24 Units Subcutaneous Q4H  . [DISCONTINUED] insulin glargine  40 Units Subcutaneous BID   Continuous Infusions:   . sodium chloride Stopped (01/21/12 1808)  . sodium chloride 20 mL/hr (01/22/12 0530)  . sodium chloride    . insulin (NOVOLIN-R) infusion 5.6 mL/hr at 01/22/12 0900  . lactated ringers 40 mL/hr at 01/20/12 1600  . [DISCONTINUED] nitroGLYCERIN Stopped (01/20/12 1500)  . [DISCONTINUED] phenylephrine (NEO-SYNEPHRINE) Adult infusion Stopped (01/20/12 2200)   PRN Meds:.acetaminophen, [EXPIRED] albumin human, fentaNYL, metoprolol, ondansetron (ZOFRAN) IV, sodium chloride, [DISCONTINUED] midazolam   Ht: 5\' 5"  (165.1 cm)  Wt: 248 lb 10.9 oz (112.8 kg)  Ideal Wt: 56.8 kg % Ideal Wt: 199%  Usual Wt:  Wt Readings from Last 10 Encounters:  01/22/12 248 lb 10.9 oz (112.8 kg)  01/22/12 248 lb 10.9 oz (112.8 kg)  01/16/12 242 lb (109.77 kg)  01/14/12 252 lb (114.306 kg)  11/29/11 249 lb (112.946 kg)  11/13/11 252 lb (114.306 kg)  11/13/11 252 lb (114.306 kg)  11/07/11 254 lb 6.4 oz (115.395 kg)  09/26/10 237 lb (107.502 kg)  05/03/10 258 lb (117.028 kg)   % Usual Wt: 100%  Body mass index is 41.38 kg/(m^2). Extreme Obesity, Class III  Food/Nutrition Related Hx: no recent weight changes  Labs:  CMP     Component Value Date/Time   NA 139 01/22/2012 0320   K 3.9 01/22/2012 0320   CL 103 01/22/2012 0320   CO2 30 01/22/2012 0320   GLUCOSE 125* 01/22/2012 0320   BUN 14 01/22/2012 0320   CREATININE 0.75 01/22/2012 0320   CALCIUM 8.5 01/22/2012 0320   PROT 7.1 01/16/2012 1506   ALBUMIN 3.7 01/16/2012 1506   AST 34 01/16/2012 1506   ALT 28 01/16/2012 1506   ALKPHOS 98 01/16/2012 1506   BILITOT 0.3 01/16/2012 1506   GFRNONAA 85* 01/22/2012 0320   GFRAA >90 01/22/2012 0320   CBG (last 3)   Basename 01/22/12 0738 01/22/12 0633 01/22/12 0526  GLUCAP 116* 112* 129*   Lab Results  Component Value Date   HGBA1C  7.6* 01/16/2012    Intake/Output Summary (Last 24 hours) at 01/22/12 0953 Last data filed at 01/22/12 0900  Gross per 24 hour  Intake 1092.14 ml  Output   3110 ml  Net -2017.86 ml  Last BM: PTA  Diet Order: Carb Control Meal completion 25%  Supplements/Tube Feeding: none  IVF:    sodium chloride Last Rate: Stopped (01/21/12 1808)  sodium chloride Last Rate: 20 mL/hr (01/22/12 0530)  sodium chloride   insulin (NOVOLIN-R) infusion Last Rate: 5.6 mL/hr at 01/22/12 0900  lactated ringers Last Rate: 40 mL/hr at 01/20/12 1600  [DISCONTINUED] nitroGLYCERIN Last Rate: Stopped (01/20/12 1500)  [DISCONTINUED] phenylephrine (NEO-SYNEPHRINE) Adult infusion Last Rate: Stopped (01/20/12 2200)   Pt is POD # 2 s/p CABG for severe 2-vessel CAD.  Pt seemed sluggish when answering questions. Pt has been eating < 25% of her meals.   Estimated Nutritional Needs:   Kcal:  1650-1850 Protein:  80-90 Fluid:  >1.7 L/day  NUTRITION DIAGNOSIS: Inadequate oral intake related to decreased appetite as evidenced by meal completion </= 25%.  MONITORING/EVALUATION(Goals): Goal: Pt to meet >/= 90% of their estimated nutrition needs. Monitor: PO intake  EDUCATION NEEDS: -No education needs identified at this time  Kendell Bane RD, LDN, CNSC [phone] Pager [phone] After Hours Pager  01/22/2012, 9:52 AM

## 2012-01-22 NOTE — Progress Notes (Signed)
Patient examined and record reviewed.Hemodynamics stable normal sinus rhythm,,labs satisfactory.Patient had stable day.Continue current care  Blood sugars running high this evening, patient will receive Lantus insulin and sliding scale.Marland Kitchen VAN TRIGT III,PETER 01/22/2012

## 2012-01-22 NOTE — Progress Notes (Addendum)
2 Days Post-Op Procedure(s) (LRB): CORONARY ARTERY BYPASS GRAFTING (CABG) (N/A) ENDOVEIN HARVEST OF GREATER SAPHENOUS VEIN (Right) Subjective: Complains of nausea this am after getting up  Objective: Vital signs in last 24 hours: Temp:  [97.6 F (36.4 C)-99.1 F (37.3 C)] 98.5 F (36.9 C) (11/27 0732) Pulse Rate:  [69-85] 74  (11/27 0600) Cardiac Rhythm:  [-] Normal sinus rhythm (11/26 2000) Resp:  [15-25] 23  (11/27 0700) BP: (114-166)/(44-77) 159/62 mmHg (11/27 0700) SpO2:  [92 %-98 %] 94 % (11/27 0600) Weight:  [112.8 kg (248 lb 10.9 oz)] 112.8 kg (248 lb 10.9 oz) (11/27 0500)  Hemodynamic parameters for last 24 hours: PAP: (30-38)/(13-20) 30/13 mmHg  Intake/Output from previous day: 11/26 0701 - 11/27 0700 In: 1204.7 [P.O.:640; I.V.:554.7; IV Piggyback:10] Out: 2810 [Urine:2770; Chest Tube:40] Intake/Output this shift:    General appearance: looks puny Heart: regular rate and rhythm, S1, S2 normal, no murmur, click, rub or gallop Lungs: clear to auscultation bilaterally Extremities: edema mild Wound: incision ok  Lab Results:  Basename 01/22/12 0320 01/21/12 1800  WBC 11.3* 11.9*  HGB 10.7* 11.1*  HCT 31.0* 32.8*  PLT 166 163   BMET:  Basename 01/22/12 0320 01/21/12 1800 01/21/12 1739 01/21/12 0500  NA 139 -- 138 --  K 3.9 -- 4.0 --  CL 103 -- 102 --  CO2 30 -- -- 27  GLUCOSE 125* -- 285* --  BUN 14 -- 15 --  CREATININE 0.75 0.79 -- --  CALCIUM 8.5 -- -- 8.1*    PT/INR:  Basename 01/20/12 1330  LABPROT 15.2  INR 1.22   ABG    Component Value Date/Time   PHART 7.326* 01/20/2012 2305   HCO3 25.4* 01/20/2012 2305   TCO2 24 01/21/2012 1739   ACIDBASEDEF 1.0 01/20/2012 2305   O2SAT 93.0 01/20/2012 2305   CBG (last 3)   Basename 01/22/12 0738 01/22/12 0633 01/22/12 0526  GLUCAP 116* 112* 129*    Assessment/Plan: S/P Procedure(s) (LRB): CORONARY ARTERY BYPASS GRAFTING (CABG) (N/A) ENDOVEIN HARVEST OF GREATER SAPHENOUS VEIN  (Right) Mobilize Diuresis Diabetes control: put back on insulin drip overnight. Will increase Lantus and add meal coverage. Resume glucophage when eating well. Hgb A1c was 7.6 preop so obviously needs better glucose control. Continue foley due to patient in ICU and urinary output monitoring   LOS: 2 days    Akshith Moncus K 01/22/2012

## 2012-01-23 LAB — CBC
MCH: 30.7 pg (ref 26.0–34.0)
MCHC: 33.9 g/dL (ref 30.0–36.0)
MCV: 90.6 fL (ref 78.0–100.0)
Platelets: 182 10*3/uL (ref 150–400)
RDW: 14.1 % (ref 11.5–15.5)
WBC: 8.4 10*3/uL (ref 4.0–10.5)

## 2012-01-23 LAB — GLUCOSE, CAPILLARY: Glucose-Capillary: 133 mg/dL — ABNORMAL HIGH (ref 70–99)

## 2012-01-23 LAB — BASIC METABOLIC PANEL
Calcium: 9 mg/dL (ref 8.4–10.5)
Creatinine, Ser: 0.74 mg/dL (ref 0.50–1.10)
GFR calc Af Amer: >90 mL/min (ref >90)
GFR calc non Af Amer: 85 mL/min — ABNORMAL LOW (ref >90)

## 2012-01-23 MED ORDER — ONDANSETRON HCL 4 MG PO TABS: 4.0000 mg | ORAL_TABLET | Freq: Four times a day (QID) | ORAL | Status: DC | PRN

## 2012-01-23 MED ORDER — DOCUSATE SODIUM 100 MG PO CAPS
200.0000 mg | ORAL_CAPSULE | Freq: Every day | ORAL | Status: DC
  Administered 2012-01-25 – 2012-01-27 (×3): 200 mg via ORAL
  Filled 2012-01-23 (×4): qty 2

## 2012-01-23 MED ORDER — BISACODYL 10 MG RE SUPP
10.0000 mg | Freq: Every day | RECTAL | Status: DC | PRN
Start: 2012-01-23 — End: 2012-01-27

## 2012-01-23 MED ORDER — POTASSIUM CHLORIDE CRYS ER 20 MEQ PO TBCR
40.0000 meq | EXTENDED_RELEASE_TABLET | Freq: Once | ORAL | Status: AC
  Administered 2012-01-23: 40 meq via ORAL
  Filled 2012-01-23: qty 2

## 2012-01-23 MED ORDER — INSULIN GLARGINE 100 UNIT/ML ~~LOC~~ SOLN
50.0000 [IU] | Freq: Two times a day (BID) | SUBCUTANEOUS | Status: DC
  Administered 2012-01-23 – 2012-01-27 (×8): 50 [IU] via SUBCUTANEOUS

## 2012-01-23 MED ORDER — ASPIRIN EC 325 MG PO TBEC
325.0000 mg | DELAYED_RELEASE_TABLET | Freq: Every day | ORAL | Status: DC
  Administered 2012-01-24 – 2012-01-27 (×4): 325 mg via ORAL
  Filled 2012-01-23 (×4): qty 1

## 2012-01-23 MED ORDER — ONDANSETRON HCL 4 MG/2ML IJ SOLN: 4.0000 mg | Freq: Four times a day (QID) | INTRAMUSCULAR | Status: DC | PRN

## 2012-01-23 MED ORDER — FUROSEMIDE 40 MG PO TABS
40.0000 mg | ORAL_TABLET | Freq: Every day | ORAL | Status: AC
  Administered 2012-01-24 – 2012-01-26 (×3): 40 mg via ORAL
  Filled 2012-01-23 (×3): qty 1

## 2012-01-23 MED ORDER — SODIUM CHLORIDE 0.9 % IJ SOLN
3.0000 mL | Freq: Two times a day (BID) | INTRAMUSCULAR | Status: DC
  Administered 2012-01-23 – 2012-01-26 (×8): 3 mL via INTRAVENOUS

## 2012-01-23 MED ORDER — INSULIN ASPART 100 UNIT/ML ~~LOC~~ SOLN
0.0000 [IU] | Freq: Three times a day (TID) | SUBCUTANEOUS | Status: DC
  Administered 2012-01-23: 12 [IU] via SUBCUTANEOUS
  Administered 2012-01-24: 8 [IU] via SUBCUTANEOUS
  Administered 2012-01-24: 2 [IU] via SUBCUTANEOUS
  Administered 2012-01-24: 12 [IU] via SUBCUTANEOUS
  Administered 2012-01-25 (×2): 2 [IU] via SUBCUTANEOUS
  Administered 2012-01-26: 4 [IU] via SUBCUTANEOUS
  Administered 2012-01-26: 8 [IU] via SUBCUTANEOUS
  Administered 2012-01-27: 4 [IU] via SUBCUTANEOUS

## 2012-01-23 MED ORDER — SODIUM CHLORIDE 0.9 % IV SOLN: 250.0000 mL | INTRAVENOUS | Status: DC | PRN

## 2012-01-23 MED ORDER — MOVING RIGHT ALONG BOOK
Freq: Once | Status: AC
  Administered 2012-01-23: 15:00:00
  Filled 2012-01-23: qty 1

## 2012-01-23 MED ORDER — BISACODYL 5 MG PO TBEC: 10.0000 mg | DELAYED_RELEASE_TABLET | Freq: Every day | ORAL | Status: DC | PRN

## 2012-01-23 MED ORDER — PANTOPRAZOLE SODIUM 40 MG PO TBEC
40.0000 mg | DELAYED_RELEASE_TABLET | Freq: Every day | ORAL | Status: DC
  Administered 2012-01-24 – 2012-01-27 (×4): 40 mg via ORAL
  Filled 2012-01-23 (×4): qty 1

## 2012-01-23 MED ORDER — METOPROLOL TARTRATE 25 MG PO TABS
25.0000 mg | ORAL_TABLET | Freq: Two times a day (BID) | ORAL | Status: DC
  Administered 2012-01-23 – 2012-01-27 (×8): 25 mg via ORAL
  Filled 2012-01-23 (×9): qty 1

## 2012-01-23 MED ORDER — SODIUM CHLORIDE 0.9 % IJ SOLN: 3.0000 mL | INTRAMUSCULAR | Status: DC | PRN

## 2012-01-23 MED ORDER — INSULIN ASPART 100 UNIT/ML ~~LOC~~ SOLN
6.0000 [IU] | Freq: Three times a day (TID) | SUBCUTANEOUS | Status: DC
  Administered 2012-01-23 – 2012-01-25 (×7): 6 [IU] via SUBCUTANEOUS

## 2012-01-23 MED ORDER — METFORMIN HCL 500 MG PO TABS
1000.0000 mg | ORAL_TABLET | Freq: Two times a day (BID) | ORAL | Status: DC
  Administered 2012-01-23 – 2012-01-27 (×8): 1000 mg via ORAL
  Filled 2012-01-23 (×9): qty 2

## 2012-01-23 MED ORDER — POTASSIUM CHLORIDE CRYS ER 20 MEQ PO TBCR
20.0000 meq | EXTENDED_RELEASE_TABLET | Freq: Two times a day (BID) | ORAL | Status: AC
  Administered 2012-01-24 – 2012-01-25 (×4): 20 meq via ORAL
  Filled 2012-01-23 (×4): qty 1

## 2012-01-23 NOTE — Progress Notes (Addendum)
3 Days Post-Op Procedure(s) (LRB): CORONARY ARTERY BYPASS GRAFTING (CABG) (N/A) ENDOVEIN HARVEST OF GREATER SAPHENOUS VEIN (Right) Subjective: No complaints. Feels much better today. Ambulated yesterday.  Objective: Vital signs in last 24 hours: Temp:  [97.9 F (36.6 C)-98.7 F (37.1 C)] 98.1 F (36.7 C) (11/28 0806) Pulse Rate:  [66-82] 77  (11/28 0700) Cardiac Rhythm:  [-] Normal sinus rhythm (11/28 0745) Resp:  [18-28] 21  (11/28 0700) BP: (94-159)/(30-103) 138/54 mmHg (11/28 0700) SpO2:  [90 %-97 %] 94 % (11/28 0700) Weight:  [111.1 kg (244 lb 14.9 oz)] 111.1 kg (244 lb 14.9 oz) (11/28 0500)  Hemodynamic parameters for last 24 hours:    Intake/Output from previous day: 11/27 0701 - 11/28 0700 In: 676 [I.V.:460; IV Piggyback:216] Out: 3365 [Urine:3365] Intake/Output this shift: Total I/O In: -  Out: 150 [Urine:150]  General appearance: alert and cooperative Heart: regular rate and rhythm, S1, S2 normal, no murmur, click, rub or gallop Lungs: diminished breath sounds bibasilar Extremities: edema mild leg edema Wound: incisions ok  Lab Results:  Basename 01/23/12 0500 01/22/12 0320  WBC 8.4 11.3*  HGB 10.1* 10.7*  HCT 29.8* 31.0*  PLT 182 166   BMET:  Basename 01/23/12 0500 01/22/12 1800  NA 138 135  K 4.1 4.3  CL 101 100  CO2 30 27  GLUCOSE 179* 238*  BUN 19 17  CREATININE 0.74 0.75  CALCIUM 9.0 8.8    PT/INR:  Basename 01/20/12 1330  LABPROT 15.2  INR 1.22   ABG    Component Value Date/Time   PHART 7.326* 01/20/2012 2305   HCO3 25.4* 01/20/2012 2305   TCO2 24 01/21/2012 1739   ACIDBASEDEF 1.0 01/20/2012 2305   O2SAT 93.0 01/20/2012 2305   CBG (last 3)   Basename 01/22/12 2200 01/22/12 1551 01/22/12 1238  GLUCAP 173* 208* 137*    Assessment/Plan: S/P Procedure(s) (LRB): CORONARY ARTERY BYPASS GRAFTING (CABG) (N/A) ENDOVEIN HARVEST OF GREATER SAPHENOUS VEIN (Right) Mobilize Diuresis Diabetes control Plan for transfer to step-down:  see transfer orders Expected acute blood loss anemia: observe  LOS: 3 days    [name] K 01/23/2012

## 2012-01-23 NOTE — Progress Notes (Signed)
Report called to RN and RN met at bedside.  Patient transferred to 2017 and attached to tele monitor without complications.

## 2012-01-23 NOTE — Progress Notes (Signed)
Pt ambulated 15ft around the unit on RA x1 assist. Pt tolerated walk well.

## 2012-01-24 DIAGNOSIS — Z951 Presence of aortocoronary bypass graft: Secondary | ICD-10-CM

## 2012-01-24 LAB — BASIC METABOLIC PANEL
CO2: 30 mEq/L (ref 19–32)
Glucose, Bld: 125 mg/dL — ABNORMAL HIGH (ref 70–99)
Potassium: 4.1 mEq/L (ref 3.5–5.1)
Sodium: 136 mEq/L (ref 135–145)

## 2012-01-24 LAB — GLUCOSE, CAPILLARY
Glucose-Capillary: 120 mg/dL — ABNORMAL HIGH (ref 70–99)
Glucose-Capillary: 213 mg/dL — ABNORMAL HIGH (ref 70–99)
Glucose-Capillary: 269 mg/dL — ABNORMAL HIGH (ref 70–99)

## 2012-01-24 LAB — CBC
Hemoglobin: 9.8 g/dL — ABNORMAL LOW (ref 12.0–15.0)
Platelets: 241 10*3/uL (ref 150–400)
RBC: 3.2 MIL/uL — ABNORMAL LOW (ref 3.87–5.11)
WBC: 8.1 10*3/uL (ref 4.0–10.5)

## 2012-01-24 MED ORDER — DIPHENHYDRAMINE HCL 25 MG PO CAPS
25.0000 mg | ORAL_CAPSULE | Freq: Four times a day (QID) | ORAL | Status: DC | PRN
  Administered 2012-01-24 (×2): 25 mg via ORAL
  Filled 2012-01-24 (×2): qty 1

## 2012-01-24 MED ORDER — ASPIRIN 325 MG PO TBEC: 325.0000 mg | DELAYED_RELEASE_TABLET | Freq: Every day | ORAL | Status: DC

## 2012-01-24 MED ORDER — METOPROLOL TARTRATE 25 MG PO TABS: 25.0000 mg | ORAL_TABLET | Freq: Two times a day (BID) | ORAL | Status: DC

## 2012-01-24 MED ORDER — POTASSIUM CHLORIDE CRYS ER 20 MEQ PO TBCR: 20.0000 meq | EXTENDED_RELEASE_TABLET | Freq: Two times a day (BID) | ORAL | Status: DC

## 2012-01-24 MED ORDER — HYDROCORTISONE 1 % EX CREA
TOPICAL_CREAM | Freq: Three times a day (TID) | CUTANEOUS | Status: DC
  Administered 2012-01-24 (×3): via TOPICAL
  Administered 2012-01-25: 1 via TOPICAL
  Administered 2012-01-25 – 2012-01-27 (×4): via TOPICAL
  Filled 2012-01-24 (×3): qty 28

## 2012-01-24 NOTE — Progress Notes (Signed)
Patient was having trouble sleeping and opted to ambulate early this morning around 0230.  Ambulated with assist x 1 and RW approximately 450 ft, tolerated well.  Left resting in recliner in her room with call bell within reach.  Will continue to monitor.  Arva Chafe

## 2012-01-24 NOTE — Progress Notes (Signed)
CARDIAC REHAB PHASE I   PRE:  Rate/Rhythm: 80 sinus  BP:  Supine:   Sitting: 154/85  Standing:    SaO2: 98%  MODE:  Ambulation: 450 ft   POST:  Rate/Rhythem: 86 sinus   BP:  Supine:   Sitting: 142/44  Standing:    SaO2: 98% 0800-0848 Pt ambulated in hallway with rolling walker x1 assist, slight veer to right.  Pt had steady gait.  Pt education completed.  Pt returned to bed, call light in reach, RN at bedside to remove pacing wires.  Pt educated about phase II cardiac rehab and referral will be sent to [facility] cardiac rehab at pt request.  Understanding verbalized  Dan Europe

## 2012-01-24 NOTE — Discharge Summary (Addendum)
Physician Discharge Summary  Patient ID: [name] MRN: [id number] DOB/AGE: 1945/05/17 66 y.o.  Admit date: 01/20/2012 Discharge date: 01/27/2012  Admission Diagnoses:  Patient Active Problem List  Diagnosis  . MALIGNANT NEOPLASM OF ASCENDING COLON  . BENIGN NEOPLASM OF COLON  . AODM  . IRON DEFICIENCY  . HYPERTENSION, BENIGN  . CAD, NATIVE VESSEL  . DIASTOLIC HEART FAILURE, CHRONIC  . GASTROPARESIS  . FIBROMYALGIA  . COUGH  . CHEST PAIN UNSPECIFIED  . FECAL OCCULT BLOOD  . PERSONAL HISTORY MALIG NEOPLASM LARGE INTESTINE  . Recurrent ventral incisional hernia  . Obesity, Class III, BMI 40-49.9 (morbid obesity)   Discharge Diagnoses:   Patient Active Problem List  Diagnosis  . MALIGNANT NEOPLASM OF ASCENDING COLON  . BENIGN NEOPLASM OF COLON  . AODM  . IRON DEFICIENCY  . HYPERTENSION, BENIGN  . CAD, NATIVE VESSEL  . DIASTOLIC HEART FAILURE, CHRONIC  . GASTROPARESIS  . FIBROMYALGIA  . COUGH  . CHEST PAIN UNSPECIFIED  . FECAL OCCULT BLOOD  . PERSONAL HISTORY MALIG NEOPLASM LARGE INTESTINE  . Recurrent ventral incisional hernia  . Obesity, Class III, BMI 40-49.9 (morbid obesity)  . S/P CABG x 3   Discharged Condition: good  History of Present Illness:   [name] is a 66 yo morbidly obese white female with history of Diabetes, Hypertension, Dyslipidemia, and known Coronary Artery Disease.  She is S/P PCI with stent placement to her LAD performed back in 2006 and 2010.  The patient also has a history of colon cancer S/P Colon resection with chemotherapy due to lymph node involvement.  The patient presented to Dr. Excell Seltzer with complaints of recurrent chest pain and shortness of breath with minimal exertion.  She underwent cardiac catheterization which showed a preserved ejection fraction and severe 2 vessel coronary disease.  It was felt due to her long standing history of diabetes that she would benefit from coronary bypass surgery.  She was referred to [facility] and  evaluated by Dr. Laneta Simmers on 01/14/2012 at which time it was felt the patient would best be treated with coronary bypass surgery.  The risks and benefits of the procedure were explained to the patient and she was agreeable to proceed with surgery tentatively scheduled for 01/20/2012.  Hospital Course:   [name] presented to [facility] on [facility]20[facility]2012.  She was taken to the operating room and underwent CABG x 3 utilizing LIMA to LAD, SVG to RCA, and SVG to Diagonal branch 1.  She also underwent Endoscopic Saphenous vein harvest of her right thigh to mid calf.  She tolerated the procedure well and was taken to the SICU in stable condition.  Patient was extubated without difficulty the evening of surgery.  Her chest tubes and lines were removed without difficulty.  She was transferred to the step down unit once medically stable.  Her pacing wires were removed and patient is maintaining NSR.  The patient is doing well.  She is ambulating with some assistance from a rolling walker.  Her blood sugars have been elevated throughout the hospital stay and she will benefit from follow up with her PCP for further diabetes management.  She had no one to stay with her so she will be evaluated for SNF on 01/27/2012. She is surgically stable for discharge.  She will follow up with Dr. Laneta Simmers in 3 weeks with a chest xray prior to her appointment.  Our office will mail the information to the patient.  She will need to contact  Dr. Randolm Idol office and set up a 2 week follow up appointment.    Significant Diagnostic Studies: cardiac catheterization  Hemodynamics:  AO 162/71  LV 173/29  Coronary angiography:  Coronary dominance: right  Left mainstem: Patent without obstructive CAD  Left anterior descending (LAD): The LAD is diffusely diseased. The stented segment throughout the proximal to mid LAD has diffuse disease. The most proximal aspect of the stent demonstrates 50% in-stent restenosis. The distal stented  segment extending beyond the distal stent edge has 80% stenosis across a long segment until the LAD normalizes in the mid vessel. The mid and distal LAD beyond the stented segment has scattered 40-50% stenosis. The first diagonal branch which is of moderate caliber as 80% proximal stenosis.  Left circumflex (LCx): The left circumflex is patent throughout. The first OM is a large vessel with mild nonobstructive plaque the second OM and third OM branches are both small.  Right coronary artery (RCA): The RCA is a large, dominant vessel. The mid vessel has 70% stenosis. This is unchanged from the previous study. Otherwise there is minor nonobstructive smooth plaque of no more than 20-30% stenosis in the mid and distal vessel. The PDA and PLA branches are all patent.  Left ventriculography: Left ventricular systolic function is normal, LVEF is estimated at 55-65%, there is no significant mitral regurgitation   Treatments: surgery:   Median sternotomy, extracorporeal circulation,  coronary artery bypass graft surgery x3 using a left internal mammary  artery graft to left anterior descending coronary artery, with a  saphenous vein graft to diagonal branch of the LAD and the right  coronary artery. Endoscopic vein harvesting from the right leg.  Disposition: 01-Home or Self Care  The patient has been discharged on:   1.Beta Blocker:  Yes [  x ]                              No   [   ]                              If No, reason:  2.Ace Inhibitor/ARB: Yes [   ]                                     No  [ x   ]                                     If No, reason: preserved EF, labile blood pressure  3.Statin:   Yes [x   ]                  No  [   ]                  If No, reason:  4.Ecasa:  Yes  [ x  ]                  No   [   ]                  If No, reason:    Discharge Orders    Future Appointments: Provider: Department: Dept Phone: Center:   03/17/2012 11:00 AM [name], MD  [place]  [facility] [place]) [phone] [id number]       Med List    Warning       Cannot display patient medications because the patient has not yet arrived.            Follow-up Information    Follow up with Alleen Borne, MD. In 3 weeks. (Office will mail you an appointment)    Contact information:   [address] [place] [address] [phone]       Follow up with [facility]. In 3 weeks.   Contact information:   [place]       Follow up with [name], MD. In 2 weeks. (Please contact office to set up appointment)    Contact information:   [address] [place] [address] [phone]       Follow up with [name], MD. (Please follow up wtih PCP for Diabetes managment)    Contact information:   [address] [place] [address] [phone]          Signed: Lowella Dandy 01/24/2012, 8:48 AM

## 2012-01-24 NOTE — Progress Notes (Signed)
Pt ambulated in hall 300 ft with RW.  Still working to arrange care at home via friends for DC tomorrow.  Has a ride coming at 11:30am.

## 2012-01-24 NOTE — Progress Notes (Signed)
Pt ambulated 350 ft with RW, tolerated very well.  Returned to bed with call bell in reach, will con't plan of care.

## 2012-01-24 NOTE — Progress Notes (Signed)
EPW's pulled per order and unit protocol.  Pt tolerated very well, all tips intact, sites painted.  Pt understands bedrest for one hour, VS q63min.  MT notifeid, will monitor closely.  Call bell in reach, DC video playing.

## 2012-01-24 NOTE — Progress Notes (Addendum)
4 Days Post-Op Procedure(s) (LRB): CORONARY ARTERY BYPASS GRAFTING (CABG) (N/A) ENDOVEIN HARVEST OF GREATER SAPHENOUS VEIN (Right) Subjective:  [name] has no complaints this morning.  She is ambulating with assistance of FWW.  She is requesting to be discharged home today +BM  Objective: Vital signs in last 24 hours: Temp:  [98.1 F (36.7 C)-98.6 F (37 C)] 98.4 F (36.9 C) (11/29 0508) Pulse Rate:  [68-89] 68  (11/29 0508) Cardiac Rhythm:  [-] Normal sinus rhythm (11/29 0800) Resp:  [18-21] 20  (11/28 1512) BP: (106-151)/(38-86) 106/38 mmHg (11/29 0508) SpO2:  [90 %-100 %] 100 % (11/29 0508)  Intake/Output from previous day: 11/28 0701 - 11/29 0700 In: 64 [I.V.:60; IV Piggyback:4] Out: 1401 [Urine:1400; Stool:1]  General appearance: alert, cooperative and no distress Heart: regular rate and rhythm Lungs: clear to auscultation bilaterally Abdomen: soft, non-tender; bowel sounds normal; no masses,  no organomegaly Extremities: edema trace Wound: clean and dry  Lab Results:  Basename 01/24/12 0513 01/23/12 0500  WBC 8.1 8.4  HGB 9.8* 10.1*  HCT 28.7* 29.8*  PLT 241 182   BMET:  Basename 01/24/12 0513 01/23/12 0500  NA 136 138  K 4.1 4.1  CL 100 101  CO2 30 30  GLUCOSE 125* 179*  BUN 19 19  CREATININE 0.81 0.74  CALCIUM 10.0 9.0    PT/INR: No results found for this basename: LABPROT,INR in the last 72 hours ABG    Component Value Date/Time   PHART 7.326* 01/20/2012 2305   HCO3 25.4* 01/20/2012 2305   TCO2 24 01/21/2012 1739   ACIDBASEDEF 1.0 01/20/2012 2305   O2SAT 93.0 01/20/2012 2305   CBG (last 3)   Basename 01/24/12 0514 01/23/12 2037 01/23/12 1618  GLUCAP 120* 133* 266*    Assessment/Plan: S/P Procedure(s) (LRB): CORONARY ARTERY BYPASS GRAFTING (CABG) (N/A) ENDOVEIN HARVEST OF GREATER SAPHENOUS VEIN (Right)  1. CV- NSR rate and pressure controlled on Lopressor 2. Pulm- no acute issues, continue IS 3. Acute post operative anemia- stable 4.  Volume Overload- continue Lasix 5. DM- continue insulin 6. Dispo- will d/c EPW, discuss patient's request for discharge with staff today vs tomorrow   LOS: 4 days    [name] 01/24/2012    Chart reviewed, patient examined, agree with above. She is doing well. Will plan to send home in am so she has time to arrange for someone to be with her. She can resume previous diabetes regimen at home. She needs dietary modification and exercise/wt loss to assist with medications and she knows this.

## 2012-01-25 LAB — GLUCOSE, CAPILLARY: Glucose-Capillary: 93 mg/dL (ref 70–99)

## 2012-01-25 MED ORDER — POTASSIUM CHLORIDE CRYS ER 20 MEQ PO TBCR: 20.0000 meq | EXTENDED_RELEASE_TABLET | Freq: Two times a day (BID) | ORAL | Status: DC

## 2012-01-25 MED ORDER — FUROSEMIDE 20 MG PO TABS: 40.0000 mg | ORAL_TABLET | Freq: Every day | ORAL | Status: DC

## 2012-01-25 MED ORDER — ASPIRIN 325 MG PO TBEC: 325.0000 mg | DELAYED_RELEASE_TABLET | Freq: Every day | ORAL | Status: DC

## 2012-01-25 MED ORDER — METOPROLOL TARTRATE 25 MG PO TABS: 25.0000 mg | ORAL_TABLET | Freq: Two times a day (BID) | ORAL | Status: DC

## 2012-01-25 MED ORDER — HYDROCORTISONE 1 % EX CREA: TOPICAL_CREAM | Freq: Three times a day (TID) | CUTANEOUS | Status: DC

## 2012-01-25 NOTE — Progress Notes (Signed)
CT sutures, removed as ordered and steri strips applied. Zaila Crew, Randall An RN

## 2012-01-25 NOTE — Progress Notes (Addendum)
                   [address]            [place] [address]          [phone]      5 Days Post-Op Procedure(s) (LRB): CORONARY ARTERY BYPASS GRAFTING (CABG) (N/A) ENDOVEIN HARVEST OF GREATER SAPHENOUS VEIN (Right)  Subjective: Patient without complaints this am and wants to go home.  Objective: Vital signs in last 24 hours: Temp:  [97.5 F (36.4 C)-98.6 F (37 C)] 97.5 F (36.4 C) (11/30 0451) Pulse Rate:  [67-83] 67  (11/30 0451) Cardiac Rhythm:  [-] Normal sinus rhythm (11/30 0801) Resp:  [18] 18  (11/30 0451) BP: (124-149)/(49-72) 125/72 mmHg (11/30 0451) SpO2:  [96 %-100 %] 96 % (11/30 0451) Weight:  [246 lb 0.5 oz (111.6 kg)] 246 lb 0.5 oz (111.6 kg) (11/30 0451)  Pre op weight 109.8  kg Current Weight  01/25/12 246 lb 0.5 oz (111.6 kg)     Intake/Output from previous day: 11/29 0701 - 11/30 0700 In: 840 [P.O.:840] Out: 150 [Urine:150]   Physical Exam:  Cardiovascular: RRR, no murmurs, gallops, or rubs. Pulmonary: Clear to auscultation bilaterally; no rales, wheezes, or rhonchi. Abdomen: Soft, non tender, bowel sounds present. Extremities: Mild bilateral lower extremity edema. Wounds: Clean and dry.  No erythema or signs of infection.  Lab Results: CBC: Basename 01/24/12 0513 01/23/12 0500  WBC 8.1 8.4  HGB 9.8* 10.1*  HCT 28.7* 29.8*  PLT 241 182   BMET:  Basename 01/24/12 0513 01/23/12 0500  NA 136 138  K 4.1 4.1  CL 100 101  CO2 30 30  GLUCOSE 125* 179*  BUN 19 19  CREATININE 0.81 0.74  CALCIUM 10.0 9.0    PT/INR:  Lab Results  Component Value Date   INR 1.22 01/20/2012   INR 0.90 01/16/2012   INR 0.9 11/07/2011   ABG:  INR: Will add last result for INR, ABG once components are confirmed Will add last 4 CBG results once components are confirmed  Assessment/Plan:  1. CV - SR. On Lopressor 25 bid 2.  Pulmonary - Encourage incentive spirometer 3. Volume Overload - Continue with diuresis 4.  Acute blood loss  anemia - Last H and H stable at 9.8 and 28.7. 5.DM-CBGs 134/269/93.On Metformin 1000 bid and insulin.Pre op HGA1C 7.6. Will need follow up as an outpatient 6.Remove chest tube sutures 7.Discharge patient. She states she has a couple of people who are going to take turns staying with her.  ZIMMERMAN,DONIELLE MPA-C 01/25/2012,8:05 AM   I have seen and examined the patient and agree with the assessment and plan as outlined.  However, apparently home arrangements are not in place and patient needs to go for short term rehab to SNF, possibly the [facility] 01/25/2012 12:06 PM

## 2012-01-25 NOTE — Progress Notes (Signed)
Pt ambulated 400 feet independently with walker tolerated well will monitor patient. Morgan Keinath, Randall An rN

## 2012-01-25 NOTE — Progress Notes (Signed)
Pt ambulated in hallway independently, tolerated well, will continue to monitor patient. Jericca Russett, [employer]

## 2012-01-25 NOTE — Progress Notes (Signed)
CARDIAC REHAB PHASE 1  09:55 Patient is in casual clothes now and has been ambulating in the hall. Reviewed patients questions regarding her education. Patient acknowledges understanding.

## 2012-01-26 LAB — GLUCOSE, CAPILLARY
Glucose-Capillary: 105 mg/dL — ABNORMAL HIGH (ref 70–99)
Glucose-Capillary: 225 mg/dL — ABNORMAL HIGH (ref 70–99)

## 2012-01-26 MED ORDER — INSULIN ASPART 100 UNIT/ML ~~LOC~~ SOLN
3.0000 [IU] | Freq: Three times a day (TID) | SUBCUTANEOUS | Status: DC
  Administered 2012-01-26 – 2012-01-27 (×3): 3 [IU] via SUBCUTANEOUS

## 2012-01-26 NOTE — Clinical Social Work Note (Addendum)
Clinical Social Work Department CLINICAL SOCIAL WORK PLACEMENT NOTE 01/26/2012  Patient:  [name], [name]  Account Number:  [id number] Admit date:  01/20/2012  Clinical Social Worker:  Skip Mayer  Date/time:  01/26/2012 05:30 PM  Clinical Social Work is seeking post-discharge placement for this patient at the following level of care:   SKILLED NURSING   (*CSW will update this form in Epic as items are completed)   01/26/2012  Patient/family provided with [facility] Department of Clinical Social Work's list of facilities offering this level of care within the geographic area requested by the patient (or if unable, by the patient's family).  01/26/2012  Patient/family informed of their freedom to choose among providers that offer the needed level of care, that participate in Medicare, Medicaid or managed care program needed by the patient, have an available bed and are willing to accept the patient.  01/26/2012  Patient/family informed of [facility]' ownership interest in [facility], as well as of the fact that they are under no obligation to receive care at this facility.  PASARR submitted to EDS on 01/26/2012 PASARR number received from EDS on 01/26/2012  FL2 transmitted to all facilities in geographic area requested by pt/family on  01/26/2012 FL2 transmitted to all facilities within larger geographic area on   Patient informed that his/her managed care company has contracts with or will negotiate with  certain facilities, including the following:     Patient/family informed of bed offers received:  01/27/12 Patient chooses bed at [facility] Physician recommends and patient chooses bed at    Patient to be transferred to [facility] on  01/27/12 Patient to be transferred to facility by [facility]  The following physician request were entered in Epic:   Additional Comments: Existing PASARR

## 2012-01-26 NOTE — Progress Notes (Signed)
Pt ambulating in hallway 150 feet with walker, tolerated well pt back in chair. Will continue to monitor patient. Aeryn Medici, Randall An RN

## 2012-01-26 NOTE — Clinical Social Work Note (Signed)
Clinical Social Work Department BRIEF PSYCHOSOCIAL ASSESSMENT 01/26/2012  Patient:  [name], [name]     Account Number:  [id number]     Admit date:  01/20/2012  Clinical Social Worker:  Skip Mayer  Date/Time:  01/26/2012 05:30 PM  Referred by:  Physician  Date Referred:  01/26/2012 Referred for  SNF Placement   Other Referral:   Interview type:  Patient Other interview type:    PSYCHOSOCIAL DATA Living Status:  ALONE Admitted from facility:   Level of care:   Primary support name:  Sharyl Nimrod Primary support relationship to patient:  FRIEND Degree of support available:   Adequate, per pt    CURRENT CONCERNS Current Concerns  Post-Acute Placement   Other Concerns:    SOCIAL WORK ASSESSMENT / PLAN CSW consulted for SNF 11/30.  PT/OT recommending SNF due to no 24 hour supervision at home.  Pt requesting [facility].  CSW explained placement process and answered questions.  FL2 complete and SNF search in [place] initiated.  Weekday CSW to f/u with offers.   Assessment/plan status:  Information/Referral to [facility] Other assessment/ plan:   Information/referral to community resources:   SNF  PTAR    PATIENT'S/FAMILY'S RESPONSE TO PLAN OF CARE: Pt reports agreeable to STR at SNF in order to increase strength and independence prior to return home alone.  Pt verbalized understanding of current d/c plan and appreciation for CSW assist.        Dellie Burns, MSW, LCSWA [phone] (Weekends 8:00am-4:30pm)

## 2012-01-26 NOTE — Evaluation (Signed)
Occupational Therapy Evaluation Patient Details Name: [name] MRN: [id number] DOB: 09/11/1945 Today's Date: 01/26/2012 Time: 4098-1191 OT Time Calculation (min): 26 min  OT Assessment / Plan / Recommendation Clinical Impression  Pt s/p CABG x3 thus affecting PLOF. Will benefit from acute OT services to address below problem list.  Recommending SNF as pt does not have 24/7 assist.    OT Assessment  Patient needs continued OT Services    Follow Up Recommendations  SNF    Barriers to Discharge Decreased caregiver support    Equipment Recommendations  None recommended by PT;None recommended by OT    Recommendations for Other Services    Frequency  Min 2X/week    Precautions / Restrictions Precautions Precautions: Sternal Restrictions Weight Bearing Restrictions: No   Pertinent Vitals/Pain See vitals    ADL  Eating/Feeding: Performed;Independent Where Assessed - Eating/Feeding: Chair Lower Body Bathing: Simulated;Moderate assistance Where Assessed - Lower Body Bathing: Supported sit to stand Lower Body Dressing: Performed;Moderate assistance Where Assessed - Lower Body Dressing: Supported sit to Pharmacist, hospital: Simulated;Moderate assistance Toilet Transfer Method: Sit to Barista:  (chair) Equipment Used: Gait belt Transfers/Ambulation Related to ADLs: min assist with HHAx1 for ambulation in room ADL Comments: Requires assist with ADLs due to sternal precautions.    OT Diagnosis: Generalized weakness;Acute pain  OT Problem List: Decreased strength;Decreased activity tolerance;Decreased knowledge of use of DME or AE;Decreased knowledge of precautions;Pain OT Treatment Interventions: Self-care/ADL training;DME and/or AE instruction;Therapeutic activities;Patient/family education   OT Goals Acute Rehab OT Goals OT Goal Formulation: With patient Time For Goal Achievement: 02/02/12 Potential to Achieve Goals: Good ADL Goals Pt Will  Perform Grooming: with supervision;Standing at sink ADL Goal: Grooming - Progress: Goal set today Pt Will Perform Lower Body Dressing: with supervision;Sit to stand from chair;Sit to stand from bed;with adaptive equipment ADL Goal: Lower Body Dressing - Progress: Goal set today Pt Will Transfer to Toilet: with supervision;Ambulation;with DME;Comfort height toilet;Other (comment) (maintaining sternal precautions) ADL Goal: Toilet Transfer - Progress: Goal set today Pt Will Perform Toileting - Clothing Manipulation: with supervision;Sitting on 3-in-1 or toilet;Standing ADL Goal: Toileting - Clothing Manipulation - Progress: Goal set today Pt Will Perform Toileting - Hygiene: with supervision;Standing at 3-in-1/toilet;Sit to stand from 3-in-1/toilet ADL Goal: Toileting - Hygiene - Progress: Goal set today Miscellaneous OT Goals Miscellaneous OT Goal #1: Pt will independently generalize sternal precautions during all ADL activity. OT Goal: Miscellaneous Goal #1 - Progress: Goal set today  Visit Information  Last OT Received On: 01/26/12 Assistance Needed: +1    Subjective Data      Prior Functioning     Home Living Lives With: Alone Available Help at Discharge: Other (Comment) (none, friends in [place]) Type of Home: House Home Access: Stairs to enter [employer] of Steps: 1 Entrance Stairs-Rails: None Home Layout: One level Bathroom Shower/Tub: Forensic scientist: Handicapped height Bathroom Accessibility: Yes How Accessible: Accessible via walker Home Adaptive Equipment: Walker - rolling;Straight cane;Bedside commode/3-in-1;Shower chair with back Prior Function Level of Independence: Independent with assistive device(s) Able to Take Stairs?: Yes Driving: Yes Vocation: Retired Comments: using cane around 50% of the time secondary to fatigue Communication Communication: No difficulties Dominant Hand: Right         Vision/Perception       Cognition  Overall Cognitive Status: Appears within functional limits for tasks assessed/performed Arousal/Alertness: Awake/alert Orientation Level: Appears intact for tasks assessed Behavior During Session: [facility] for tasks performed    Extremity/Trunk Assessment Right  Upper Extremity Assessment RUE ROM/Strength/Tone: [facility] for tasks assessed Left Upper Extremity Assessment LUE ROM/Strength/Tone: WFL for tasks assessed Right Lower Extremity Assessment RLE ROM/Strength/Tone: Within functional levels RLE Sensation: WFL - Light Touch Left Lower Extremity Assessment LLE ROM/Strength/Tone: Within functional levels LLE Sensation: WFL - Light Touch     Mobility Bed Mobility Bed Mobility: Not assessed (pt up in chair upon arrival) Transfers Transfers: Sit to Stand;Stand to Sit Sit to Stand: 3: Mod assist;With upper extremity assist;From chair/3-in-1 Stand to Sit: 4: Min assist;With upper extremity assist;To chair/3-in-1 Details for Transfer Assistance: assist for lift off with cueing to not use UEs due to sternal precautions     Shoulder Instructions     Exercise     Balance     End of Session OT - End of Session Equipment Utilized During Treatment: Gait belt Activity Tolerance: Patient tolerated treatment well Patient left: in chair;with call bell/phone within reach Nurse Communication: Mobility status  GO    01/26/2012 Cipriano Mile OTR/L Pager [phone] Office [phone]  Cipriano Mile 01/26/2012, 4:51 PM

## 2012-01-26 NOTE — Progress Notes (Signed)
Patient ambulated independently approximately 573ft using a rolling walker on room air. Patient's gait was slow and steady; vital signs remained stable. Patient tolerated ambulation well. Will continue to monitor.

## 2012-01-26 NOTE — Evaluation (Signed)
Physical Therapy Evaluation Patient Details Name: [name] MRN: [id number] DOB: 1946-02-25 Today's Date: 01/26/2012 Time: 4098-1191 PT Time Calculation (min): 26 min  PT Assessment / Plan / Recommendation Clinical Impression  Pt s/p CABG x 3. Pt lives alone and requires assistance for safety to maintain sternal precautions during activity. Pt will benefit from skilled PT in the acute care setting in order to maximize functional mobility and safety prior to d/c    PT Assessment  Patient needs continued PT services    Follow Up Recommendations  SNF;Supervision/Assistance - 24 hour    Does the patient have the potential to tolerate intense rehabilitation      Barriers to Discharge Decreased caregiver support      Equipment Recommendations  None recommended by PT    Recommendations for Other Services     Frequency Min 3X/week    Precautions / Restrictions Precautions Precautions: Sternal Restrictions Weight Bearing Restrictions: No   Pertinent Vitals/Pain Sternal pain 4/10      Mobility  Bed Mobility Bed Mobility: Not assessed Transfers Transfers: Sit to Stand;Stand to Sit Sit to Stand: 3: Mod assist;With upper extremity assist;From chair/3-in-1 Stand to Sit: 4: Min assist;With upper extremity assist;To chair/3-in-1 Ambulation/Gait Ambulation/Gait Assistance: 4: Min assist Ambulation Distance (Feet): 100 Feet Assistive device: 1 person hand held assist Ambulation/Gait Assistance Details: Min assist for stability. Pt with increased lateral lean bilaterally. Pt required short rest breaks secondary to fatigue Gait Pattern: Step-through pattern;Lateral trunk lean to right;Lateral trunk lean to left Gait velocity: slow gait speed    Shoulder Instructions     Exercises     PT Diagnosis: Abnormality of gait;Acute pain  PT Problem List: Decreased activity tolerance;Decreased mobility;Decreased knowledge of use of DME;Decreased safety awareness;Decreased knowledge of  precautions;Pain PT Treatment Interventions: DME instruction;Gait training;Functional mobility training;Therapeutic activities;Patient/family education   PT Goals Acute Rehab PT Goals PT Goal Formulation: With patient Time For Goal Achievement: 02/02/12 Potential to Achieve Goals: Good Pt will go Sit to Stand: with supervision PT Goal: Sit to Stand - Progress: Goal set today Pt will go Stand to Sit: with supervision PT Goal: Stand to Sit - Progress: Goal set today Pt will Transfer Bed to Chair/Chair to Bed: with supervision PT Transfer Goal: Bed to Chair/Chair to Bed - Progress: Goal set today Pt will Ambulate: with supervision;>150 feet;with least restrictive assistive device;Other (comment) (dyspnea <2) PT Goal: Ambulate - Progress: Goal set today  Visit Information  Last PT Received On: 01/26/12 Assistance Needed: +1 PT/OT Co-Evaluation/Treatment: Yes    Subjective Data  Patient Stated Goal: to be independent   Prior Functioning  Home Living Lives With: Alone Available Help at Discharge: Other (Comment) (none, friends in [place]) Type of Home: House Home Access: Stairs to enter [employer] of Steps: 1 Entrance Stairs-Rails: None Home Layout: One level Bathroom Shower/Tub: Forensic scientist: Handicapped height Bathroom Accessibility: Yes How Accessible: Accessible via walker Home Adaptive Equipment: Walker - rolling;Straight cane;Bedside commode/3-in-1;Shower chair with back Prior Function Level of Independence: Independent with assistive device(s) Able to Take Stairs?: Yes Driving: Yes Vocation: Retired Comments: using cane around 50% of the time secondary to fatigue Communication Communication: No difficulties Dominant Hand: Right    Cognition  Overall Cognitive Status: Appears within functional limits for tasks assessed/performed Arousal/Alertness: Awake/alert Orientation Level: Appears intact for tasks assessed Behavior  During Session: [facility] for tasks performed    Extremity/Trunk Assessment Right Lower Extremity Assessment RLE ROM/Strength/Tone: Within functional levels RLE Sensation: WFL - Light Touch Left  Lower Extremity Assessment LLE ROM/Strength/Tone: Within functional levels LLE Sensation: WFL - Light Touch   Balance    End of Session PT - End of Session Equipment Utilized During Treatment: Gait belt Activity Tolerance: Patient tolerated treatment well Patient left: in chair;with call bell/phone within reach Nurse Communication: Mobility status  GP     Milana Kidney 01/26/2012, 4:46 PM

## 2012-01-26 NOTE — Progress Notes (Signed)
Pt ambulated in hallway independently, tolerated well, 400 feet with walker will continue to monitor patient. [name], Randall An RN

## 2012-01-26 NOTE — Progress Notes (Addendum)
                   [address]            [place] [address]          [phone]      6 Days Post-Op Procedure(s) (LRB): CORONARY ARTERY BYPASS GRAFTING (CABG) (N/A) ENDOVEIN HARVEST OF GREATER SAPHENOUS VEIN (Right)  Subjective: Patient without complaints this am.  Objective: Vital signs in last 24 hours: Temp:  [97.6 F (36.4 C)-98.3 F (36.8 C)] 98 F (36.7 C) (12/01 0441) Pulse Rate:  [74-85] 74  (12/01 0441) Cardiac Rhythm:  [-] Normal sinus rhythm (12/01 0738) Resp:  [18-20] 20  (12/01 0441) BP: (115-152)/(53-68) 115/68 mmHg (12/01 0441) SpO2:  [95 %-97 %] 95 % (12/01 0441) Weight:  [241 lb 14.4 oz (109.725 kg)] 241 lb 14.4 oz (109.725 kg) (12/01 0441)  Pre op weight 109.8  kg Current Weight  01/26/12 241 lb 14.4 oz (109.725 kg)     Intake/Output from previous day: 11/30 0701 - 12/01 0700 In: 240 [P.O.:240] Out: -    Physical Exam:  Cardiovascular: RRR, no murmurs, gallops, or rubs. Pulmonary: Clear to auscultation bilaterally; no rales, wheezes, or rhonchi. Abdomen: Soft, non tender, bowel sounds present. Extremities: Mild bilateral lower extremity edema. Wounds: Clean and dry.  No erythema or signs of infection.  Lab Results: CBC:  Basename 01/24/12 0513  WBC 8.1  HGB 9.8*  HCT 28.7*  PLT 241   BMET:   Basename 01/24/12 0513  NA 136  K 4.1  CL 100  CO2 30  GLUCOSE 125*  BUN 19  CREATININE 0.81  CALCIUM 10.0    PT/INR:  Lab Results  Component Value Date   INR 1.22 01/20/2012   INR 0.90 01/16/2012   INR 0.9 11/07/2011   ABG:  INR: Will add last result for INR, ABG once components are confirmed Will add last 4 CBG results once components are confirmed  Assessment/Plan:  1. CV - SR. On Lopressor 25 bid 2.  Pulmonary - Encourage incentive spirometer 3. Volume Overload - Continue with diuresis 4.  Acute blood loss anemia - Last H and H stable at 9.8 and 28.7. 5.DM-CBGs 157/137/78 .On Metformin 1000 bid and  insulin.Pre op HGA1C 7.6. Will need follow up as an outpatient 6.Awaiting SNF placement in am  ZIMMERMAN,DONIELLE MPA-C 01/26/2012,9:25 AM   I have seen and examined the patient and agree with the assessment and plan as outlined.  OWEN,CLARENCE H 01/26/2012 10:51 AM

## 2012-01-27 ENCOUNTER — Inpatient Hospital Stay
Admission: RE | Admit: 2012-01-27 | Discharge: 2012-01-28 | Disposition: A | Discharge status: Home / Self Care | Payer: Medicare Other | Source: Ambulatory Visit | Attending: Internal Medicine | Admitting: Internal Medicine

## 2012-01-27 LAB — GLUCOSE, CAPILLARY
Glucose-Capillary: 199 mg/dL — ABNORMAL HIGH (ref 70–99)
Glucose-Capillary: 133 mg/dL — ABNORMAL HIGH (ref 70–99)

## 2012-01-27 NOTE — Progress Notes (Signed)
                    [address]            [place] [address]          [phone]     7 Days Post-Op Procedure(s) (LRB): CORONARY ARTERY BYPASS GRAFTING (CABG) (N/A) ENDOVEIN HARVEST OF GREATER SAPHENOUS VEIN (Right)  Subjective: Feels well, ready for discharge.   Objective: Vital signs in last 24 hours: Patient Vitals for the past 24 hrs:  BP Temp Temp src Pulse Resp SpO2 Weight  01/27/12 0549 134/49 mmHg - - 67  - - -  01/27/12 0413 120/41 mmHg 97.6 F (36.4 C) Oral 66  18  97 % 242 lb 1.6 oz (109.816 kg)  01/26/12 2129 164/71 mmHg - - - - - -  01/26/12 2053 117/33 mmHg 98.1 F (36.7 C) Oral 66  20  100 % -  01/26/12 1409 117/51 mmHg 97.1 F (36.2 C) Oral 80  18  98 % -  01/26/12 0951 120/60 mmHg - - 80  - - -   Current Weight  01/27/12 242 lb 1.6 oz (109.816 kg)     Intake/Output from previous day: 12/01 0701 - 12/02 0700 In: 843 [P.O.:840; I.V.:3] Out: -   CBGs 225-199-79  PHYSICAL EXAM:  Heart: RRR Lungs: Clear Wound: Clean and dry Extremities: Mild LE edema, R>L    Lab Results: CBC:No results found for this basename: WBC:2,HGB:2,HCT:2,PLT:2 in the last 72 hours BMET: No results found for this basename: NA:2,K:2,CL:2,CO2:2,GLUCOSE:2,BUN:2,CREATININE:2,CALCIUM:2 in the last 72 hours  PT/INR: No results found for this basename: LABPROT,INR in the last 72 hours    Assessment/Plan: S/P Procedure(s) (LRB): CORONARY ARTERY BYPASS GRAFTING (CABG) (N/A) ENDOVEIN HARVEST OF GREATER SAPHENOUS VEIN (Right) Stable, ready for tx to SNF when bed available.    LOS: 7 days    [name] H 01/27/2012

## 2012-01-27 NOTE — Progress Notes (Signed)
Clinical Social Work  [employer] signed. Dc summary faxed to [facility] agreeable to admission today. CSW prepared dc packet and informed patient and RN of Dc. Both parties agreeable and patient desires [facility] transport. CSW coordinated transportation via [facility]. CSW is signing off.  [place], [place] [phone] (Coverage for Frederico Hamman)

## 2012-01-27 NOTE — Progress Notes (Signed)
   PT Cancellation Note  Patient Details Name: [name] MRN: [id number] DOB: Apr 22, 1945   Cancelled Treatment:    Reason Eval/Treat Not Completed:  (pt ambulated in hallway earlier and getting ready to go NH)   INGOLD,Zuri Bradway 01/27/2012, 2:16 PM  [facility] [phone] (pager)

## 2012-01-27 NOTE — Progress Notes (Signed)
Occupational Therapy Treatment Patient Details Name: [name] MRN: [id number] DOB: 08-Nov-1945 Today's Date: 01/27/2012 Time: 1191-4782 OT Time Calculation (min): 25 min  OT Assessment / Plan / Recommendation Comments on Treatment Session This 66 yo female making progress. Will benefit from a brief stay at SNF due to pt lives alone    Follow Up Recommendations  SNF       Equipment Recommendations  None recommended by OT;None recommended by PT       Frequency Min 2X/week   Plan Discharge plan remains appropriate    Precautions / Restrictions Precautions Precautions: Sternal Restrictions Weight Bearing Restrictions: No       ADL  Upper Body Bathing: Set up Where Assessed - Upper Body Bathing: Unsupported standing Lower Body Bathing: Performed;Moderate assistance (lower legs and feet) Where Assessed - Lower Body Bathing: Unsupported sit to stand Upper Body Dressing: Moderate assistance (due to tighter fitting pullover shirt) Where Assessed - Upper Body Dressing: Unsupported standing Lower Body Dressing: Performed;Moderate assistance (for socks and shoes without AE) Where Assessed - Lower Body Dressing: Unsupported sit to stand Toilet Transfer: Simulated;Supervision/safety Toilet Transfer Method: Sit to Barista:  (from sink to chair without use of arms to sit and then stand) Equipment Used:  (None) Transfers/Ambulation Related to ADLs: Supervision for sit to stand, stand to sit, and ambulation in room without RW ADL Comments: Pt aware she should not push with her Bil UEs.     OT Goals ADL Goals ADL Goal: Toilet Transfer - Progress: Progressing toward goals Miscellaneous OT Goals OT Goal: Miscellaneous Goal #1 - Progress: Progressing toward goals  Visit Information  Last OT Received On: 01/27/12 Assistance Needed: +1    Subjective Data  Subjective: I have to remember to not push up with my arms when I am standing up---having something in my  hands helps to remind me. Patient Stated Goal: I really hope they have a bed for me today at [facility]      Cognition  Overall Cognitive Status: Appears within functional limits for tasks assessed/performed Arousal/Alertness: Awake/alert Orientation Level: Appears intact for tasks assessed Behavior During Session: [facility] for tasks performed    Mobility   Transfers Transfers: Sit to Stand;Stand to Sit Sit to Stand: 5: Supervision;Without upper extremity assist;From chair/3-in-1 Stand to Sit: 5: Supervision;Without upper extremity assist;To chair/3-in-1             End of Session OT - End of Session Equipment Utilized During Treatment:  (None) Activity Tolerance: Patient tolerated treatment well Patient left:  (walking with cardiac rehab)       Evette Georges [phone] 01/27/2012, 9:51 AM

## 2012-01-27 NOTE — Care Management Note (Signed)
    Page 1 of 1   01/27/2012     4:15:07 PM   CARE MANAGEMENT NOTE 01/27/2012  Patient:  [name], [name]   Account Number:  [id number]  Date Initiated:  01/27/2012  Documentation initiated by:  Riverlyn Kizziah  Subjective/Objective Assessment:   PT S/P CABG X 3 ON 01/20/12.  PTA, PT INDEPENDENT, LIVES ALONE.     Action/Plan:   PT HAS NO 24HR SUPERVISION AT DC.  WILL NEED SHORT TERM SNF FOR REHAB.   Anticipated DC Date:  01/27/2012   Anticipated DC Plan:  SKILLED NURSING FACILITY  In-house referral  Clinical Social Worker         Choice offered to / List presented to:             Status of service:  Completed, signed off Medicare Important Message given?   (If response is "NO", the following Medicare IM given date fields will be blank) Date Medicare IM given:   Date Additional Medicare IM given:    Discharge Disposition:  SKILLED NURSING FACILITY  Per UR Regulation:  Reviewed for med. necessity/level of care/duration of stay  If discussed at Long Length of Stay Meetings, dates discussed:    Comments:  01/27/12 Kendrix Orman,RN,BSN [phone] PT TO DC TODAY TO [facility], PER CSW ARRANGEMENTS.

## 2012-01-27 NOTE — Progress Notes (Signed)
Clinical Social Work  CSW faxed [employer] summary to [facility]. SNF agreeable to admission today. CSW needs FL2 signed by MD. CSW received page back from MD's RN who reports that MD is in surgery. RN reports as soon as MD is out of surgery she will ask MD to sign forms. CSW will await for RN's call.  [place], [place] [phone]

## 2012-01-27 NOTE — Progress Notes (Signed)
CARDIAC REHAB PHASE I   PRE:  Rate/Rhythm: 96SR  BP:  Supine:   Sitting: 137/42  Standing:    SaO2: 94%RA  MODE:  Ambulation: 420 ft   POST:  Rate/Rhythem: 106ST  BP:  Supine:   Sitting: 149/64  Standing:    SaO2: 98%RA 1610-9604 Pt just completed working with OT. Walked 420 ft on RA with rolling walker with steady gait. Stopped several times due to SOB. Tolerated well. To chair with call bell after walk. States ready to leave.  Duanne Limerick

## 2012-01-29 LAB — GLUCOSE, CAPILLARY: Glucose-Capillary: 80 mg/dL (ref 70–99)

## 2012-02-04 ENCOUNTER — Encounter (HOSPITAL_COMMUNITY): Payer: Medicare Other

## 2012-02-07 ENCOUNTER — Emergency Department (HOSPITAL_COMMUNITY): Payer: Medicare Other

## 2012-02-07 ENCOUNTER — Observation Stay (HOSPITAL_COMMUNITY)
Admission: EM | Admit: 2012-02-07 | Discharge: 2012-02-10 | Disposition: A | Discharge status: Home / Self Care | Payer: Medicare Other | Attending: Internal Medicine | Admitting: Internal Medicine

## 2012-02-07 ENCOUNTER — Encounter (HOSPITAL_COMMUNITY): Payer: Self-pay | Admitting: *Deleted

## 2012-02-07 DIAGNOSIS — F411 Generalized anxiety disorder: Secondary | ICD-10-CM | POA: Insufficient documentation

## 2012-02-07 DIAGNOSIS — Z85038 Personal history of other malignant neoplasm of large intestine: Secondary | ICD-10-CM | POA: Insufficient documentation

## 2012-02-07 DIAGNOSIS — G4730 Sleep apnea, unspecified: Secondary | ICD-10-CM | POA: Insufficient documentation

## 2012-02-07 DIAGNOSIS — R112 Nausea with vomiting, unspecified: Secondary | ICD-10-CM

## 2012-02-07 DIAGNOSIS — R079 Chest pain, unspecified: Secondary | ICD-10-CM | POA: Diagnosis present

## 2012-02-07 DIAGNOSIS — I10 Essential (primary) hypertension: Secondary | ICD-10-CM | POA: Diagnosis present

## 2012-02-07 DIAGNOSIS — R1110 Vomiting, unspecified: Secondary | ICD-10-CM | POA: Diagnosis present

## 2012-02-07 DIAGNOSIS — F329 Major depressive disorder, single episode, unspecified: Secondary | ICD-10-CM | POA: Insufficient documentation

## 2012-02-07 DIAGNOSIS — F3289 Other specified depressive episodes: Secondary | ICD-10-CM | POA: Insufficient documentation

## 2012-02-07 DIAGNOSIS — Z951 Presence of aortocoronary bypass graft: Secondary | ICD-10-CM

## 2012-02-07 DIAGNOSIS — N390 Urinary tract infection, site not specified: Secondary | ICD-10-CM | POA: Insufficient documentation

## 2012-02-07 DIAGNOSIS — I5032 Chronic diastolic (congestive) heart failure: Secondary | ICD-10-CM | POA: Diagnosis present

## 2012-02-07 DIAGNOSIS — E66813 Obesity, class 3: Secondary | ICD-10-CM | POA: Diagnosis present

## 2012-02-07 DIAGNOSIS — K5289 Other specified noninfective gastroenteritis and colitis: Principal | ICD-10-CM | POA: Insufficient documentation

## 2012-02-07 DIAGNOSIS — I2510 Atherosclerotic heart disease of native coronary artery without angina pectoris: Secondary | ICD-10-CM | POA: Diagnosis present

## 2012-02-07 DIAGNOSIS — E119 Type 2 diabetes mellitus without complications: Secondary | ICD-10-CM

## 2012-02-07 LAB — URINALYSIS, ROUTINE W REFLEX MICROSCOPIC
Glucose, UA: NEGATIVE mg/dL
Ketones, ur: NEGATIVE mg/dL
Nitrite: NEGATIVE
Specific Gravity, Urine: 1.02 (ref 1.005–1.030)
pH: 6 (ref 5.0–8.0)

## 2012-02-07 LAB — CBC WITH DIFFERENTIAL/PLATELET
Basophils Absolute: 0.1 10*3/uL (ref 0.0–0.1)
Basophils Relative: 1 % (ref 0–1)
Eosinophils Absolute: 0.3 10*3/uL (ref 0.0–0.7)
MCHC: 33.1 g/dL (ref 30.0–36.0)
Neutro Abs: 3.7 10*3/uL (ref 1.7–7.7)
Neutrophils Relative %: 66 % (ref 43–77)
Platelets: 298 10*3/uL (ref 150–400)
RDW: 13.7 % (ref 11.5–15.5)

## 2012-02-07 LAB — COMPREHENSIVE METABOLIC PANEL
AST: 26 U/L (ref 0–37)
Albumin: 3.4 g/dL — ABNORMAL LOW (ref 3.5–5.2)
Alkaline Phosphatase: 116 U/L (ref 39–117)
Chloride: 101 mEq/L (ref 96–112)
Potassium: 4.7 mEq/L (ref 3.5–5.1)
Total Bilirubin: 0.2 mg/dL — ABNORMAL LOW (ref 0.3–1.2)
Total Protein: 6.7 g/dL (ref 6.0–8.3)

## 2012-02-07 LAB — URINE MICROSCOPIC-ADD ON

## 2012-02-07 MED ORDER — SODIUM CHLORIDE 0.9 % IV SOLN
INTRAVENOUS | Status: DC
  Administered 2012-02-07: 22:00:00 via INTRAVENOUS

## 2012-02-07 MED ORDER — ONDANSETRON HCL 4 MG/2ML IJ SOLN
INTRAMUSCULAR | Status: AC
  Administered 2012-02-07: 4 mg via INTRAVENOUS
  Filled 2012-02-07: qty 2

## 2012-02-07 MED ORDER — CIPROFLOXACIN HCL 500 MG PO TABS: 500.0000 mg | ORAL_TABLET | Freq: Two times a day (BID) | ORAL | Status: DC

## 2012-02-07 MED ORDER — ONDANSETRON HCL 4 MG/2ML IJ SOLN
4.0000 mg | Freq: Once | INTRAMUSCULAR | Status: AC
  Administered 2012-02-07: 4 mg via INTRAVENOUS
  Filled 2012-02-07: qty 2

## 2012-02-07 MED ORDER — SODIUM CHLORIDE 0.9 % IV BOLUS (SEPSIS)
250.0000 mL | Freq: Once | INTRAVENOUS | Status: AC
  Administered 2012-02-07: 250 mL via INTRAVENOUS

## 2012-02-07 MED ORDER — ONDANSETRON HCL 4 MG/2ML IJ SOLN
4.0000 mg | Freq: Once | INTRAMUSCULAR | Status: AC
  Administered 2012-02-07: 4 mg via INTRAVENOUS

## 2012-02-07 MED ORDER — CIPROFLOXACIN IN D5W 400 MG/200ML IV SOLN
400.0000 mg | Freq: Once | INTRAVENOUS | Status: AC
  Administered 2012-02-08: 400 mg via INTRAVENOUS
  Filled 2012-02-07: qty 200

## 2012-02-07 MED ORDER — ONDANSETRON 8 MG PO TBDP: 8.0000 mg | ORAL_TABLET | Freq: Three times a day (TID) | ORAL | Status: DC | PRN

## 2012-02-07 NOTE — ED Notes (Signed)
Pt arrived by ems complaining of nausea & vomiting that started 1 hour ago.

## 2012-02-07 NOTE — ED Notes (Signed)
No longer vomiting.  Pt states "I can't go home, I live by myself."

## 2012-02-07 NOTE — ED Notes (Signed)
Pt able to get up out of bed to use bedside commode.

## 2012-02-07 NOTE — ED Notes (Signed)
Multiple surgical wounds present on abdomen, midline sternum scar appears well approximated and pink.  Pt states she recently had a triple vessel CABG mid November 2013.

## 2012-02-07 NOTE — ED Provider Notes (Signed)
History   This chart was scribed for Shelda Jakes, MD by Gerlean Ren, ED Scribe. This patient was seen in room [facility]07/[facility]07 and the patient's care was started at 8:22 PM    CSN: [id number]  Arrival date & time 02/07/12  1946   First MD Initiated Contact with Patient 02/07/12 2004      Chief Complaint  Patient presents with  . Nausea  . Emesis    The history is provided by the patient. No language interpreter was used.  [name] Breach is a 66 y.o. female with h/o CAD, DM, HTN, GERD, and gastroparesis brought in by ambulance to the Emergency Department complaining of constant nausea and non-bloody, non-bilious emesis with sudden onset at 6:00 PM tonight when waking up from a nap with associated diaphoresis at onset that has since resolved and generalized weakness that is still present.  Pt denies associated chest pain, diarrhea, abdominal pain, dysuria, cough, congestion, sore throat, rhinorrhea, HA, myalgias.  Pt had triple bypass 11/25.  Pt lives by herself.  Pt denies tobacco use but reports occasional alcohol use. PCP is Dr. Ouida Sills. Cardiologist is Dr. Excell Seltzer with [facility]. Past Medical History  Diagnosis Date  . Coronary artery disease     a. s/p stent to lad 2006 w subsequent kissing balloon pci to diagonal;  b.  pci of the lad w a drug-eluting stent July 2010;  c.  LHC 11/13/11: p-mLAD stent with prox 50% ISR, 80% beyond dist edge of stent, m+dLAD beyond stent 40-50%, mod caliber pD1 80%, mRCA 70%, m-dRCA 20-30%, EF 55-65% => med Rx (consider CABG)  . Diabetes mellitus   . Hypertension   . Dyslipidemia   . Morbid obesity   . GERD (gastroesophageal reflux disease)   . Varicose vein   . Gastroparesis   . Osteoarthritis   . Blurred vision   . Acoustic neuroma     left ear  . Anxiety   . Depressed   . Asthma     AS CHILD   . Sleep apnea     12+ YRS NO MACHINE GOTTEN   . Anginal pain     DR Gwynneth Macleod    . Headache   . Fibromyalgia   . Hiatal hernia   .  Adenocarcinoma     ascending colon, LNs pos -- S/P right hemicolectomy, xeloda completed 6/10 (dr. Cleone Slim)  . Morbid obesity     Past Surgical History  Procedure Date  . Cataract extraction   . Fetal surgery for congenital hernia   . Knee arthroscopy   . Replacement total knee   . Tonsillectomy   . Hemicolectomy 12/28/07    right for adenocarcinoma of ascending colon  . Hysterotomy 1993  . Herniaorraphy 01/22/06    for recurrent ventral hernia  . Laparotomy     for small bowl obstruction  . Resecton of acoustic neuroma   . Stent surgery   . Abdominal hysterectomy 1993  . Cholecystectomy 1966  . Hernia repair 1990, 2002, 2007    open x1, lap x2   . Colon surgery   . Portacath placement   . Excision morton's neuroma   . Appendectomy   . Cardiac catheterization   . Lesion removal 10/08/2011    Procedure: LESION REMOVAL;  Surgeon: Darreld Mclean, MD;  Location: [facility];  Service: Orthopedics;  Laterality: Right;  Removal Lesion Right Long Finger  . Back surgery   . Eye surgery     cataract on right 2005, left 2008  .  Joint replacement 2005 and 2006    right 2005, left 2006  . Finger replantation     RECENTLY   . Mandible surgery   . Coronary artery bypass graft 01/20/2012    Procedure: CORONARY ARTERY BYPASS GRAFTING (CABG);  Surgeon: Alleen Borne, MD;  Location: [facility];  Service: Open Heart Surgery;  Laterality: N/A;  Coronary artery bypass graft times three utilizing the left internal mammary artery and the right greater saphenous vein harvested endoscopically  . Endovein harvest of greater saphenous vein 01/20/2012    Procedure: ENDOVEIN HARVEST OF GREATER SAPHENOUS VEIN;  Surgeon: Alleen Borne, MD;  Location: [facility] OR;  Service: Open Heart Surgery;  Laterality: Right;    Family History  Problem Relation Age of Onset  . Coronary artery disease Other     family history  . Asthma Other     family history-grandfather  . Colon cancer Other     family hx  . Diabetes Maternal  Grandmother     unspecif if Maternal or Paternal  . Heart failure Mother   . Cirrhosis Father     History  Substance Use Topics  . Smoking status: Never Smoker   . Smokeless tobacco: Not on file  . Alcohol Use: No     Comment: occasional    No OB history provided.   Review of Systems  Constitutional: Positive for diaphoresis. Negative for fever.  HENT: Negative for congestion, sore throat and neck pain.   Respiratory: Negative for cough and shortness of breath.   Cardiovascular: Negative for chest pain and leg swelling.  Gastrointestinal: Positive for nausea and vomiting. Negative for abdominal pain and diarrhea.  Genitourinary: Negative for dysuria.  Musculoskeletal: Negative for myalgias and back pain.  Skin: Negative for rash.  Neurological: Negative for headaches.  Hematological: Does not bruise/bleed easily.  Psychiatric/Behavioral: Negative for confusion.    Allergies  Latex; Morphine; Adhesive; Codeine; and Penicillins  Home Medications   Current Outpatient Rx  Name  Route  Sig  Dispense  Refill  . AMLODIPINE BESYLATE 5 MG PO TABS   Oral   Take 5 mg by mouth every morning.         . ASPIRIN 325 MG PO TBEC   Oral   Take 325 mg by mouth every morning.         . ATENOLOL 50 MG PO TABS   Oral   Take 50 mg by mouth at bedtime.         . FLUOXETINE HCL 20 MG PO CAPS   Oral   Take 20-40 mg by mouth 2 (two) times daily. Takes two capsules in the morning and one capsule at bedtime         . FUROSEMIDE 20 MG PO TABS   Oral   Take 20 mg by mouth every morning.         Marland Kitchen HYDROCORTISONE 1 % EX CREA   Topical   Apply 1 application topically 3 (three) times daily.         . INSULIN GLARGINE 100 UNIT/ML [place] SOLN   Subcutaneous   Inject 80 Units into the skin at bedtime.          Marland Kitchen LOSARTAN POTASSIUM 50 MG PO TABS   Oral   Take 50 mg by mouth every morning.         Marland Kitchen METFORMIN HCL 500 MG PO TABS   Oral   Take 1,000 mg by mouth 2 (two) times  daily.         Marland Kitchen  METHYLCELLULOSE 1 % OP SOLN   Both Eyes   Place 1 drop into both eyes as needed. For dry eyes         . ADULT MULTIVITAMIN W/MINERALS CH   Oral   Take 1 tablet by mouth every morning.          Marland Kitchen NAPROXEN SODIUM 220 MG PO TABS   Oral   Take 220 mg by mouth every 8 (eight) hours as needed. For pain         . POTASSIUM CHLORIDE CRYS ER 20 MEQ PO TBCR   Oral   Take 1 tablet (20 mEq total) by mouth 2 (two) times daily. For 5 days then stop   10 tablet   0   . PRAVASTATIN SODIUM 20 MG PO TABS   Oral   Take 20 mg by mouth daily.         Marland Kitchen PROMETHAZINE HCL 12.5 MG PO TABS   Oral   Take 12.5-25 mg by mouth daily as needed. For nausea and vomiting         . TROLAMINE SALICYLATE 10 % EX CREA   Topical   Apply 1 application topically as needed. For pain in knees         . HUMALOG PEN [place]   Subcutaneous   Inject 15 Units into the skin 2 (two) times daily.          Marland Kitchen ONDANSETRON 8 MG PO TBDP   Oral   Take 1 tablet (8 mg total) by mouth every 8 (eight) hours as needed for nausea.   12 tablet   0     BP 171/79  Pulse 88  Temp 98.2 F (36.8 C) (Oral)  Resp 20  Ht 5' 5.5" (1.664 m)  Wt 242 lb (109.77 kg)  BMI 39.66 kg/m2  SpO2 96%  Physical Exam  Nursing note and vitals reviewed. Constitutional: She is oriented to person, place, and time. She appears well-developed and well-nourished.  HENT:  Head: Normocephalic and atraumatic.       Mucous membranes moist.  Eyes: Conjunctivae normal and EOM are normal.  Neck: No tracheal deviation present.  Cardiovascular: Normal rate, regular rhythm and normal heart sounds.   No murmur heard. Pulmonary/Chest: Effort normal and breath sounds normal. She has no wheezes.       Substernal incision scar appears normal.  Abdominal: Bowel sounds are normal. There is no tenderness.  Musculoskeletal: Normal range of motion. She exhibits no edema.       No pitting edema in bilateral lower extremities.   Lymphadenopathy:    She has no cervical adenopathy.  Neurological: She is alert and oriented to person, place, and time. No cranial nerve deficit. Coordination normal.  Skin: Skin is warm. No rash noted. She is not diaphoretic.  Psychiatric: She has a normal mood and affect.    ED Course  Procedures (including critical care time) DIAGNOSTIC STUDIES: Oxygen Saturation is 96% on room air, adequate by my interpretation.    COORDINATION OF CARE: 8:27 PM- Patient informed of clinical course, understands medical decision-making process, and agrees with plan.  Ordered IV fluids, IV Zofran, CBC, c-met, troponin I, urinalysis, and chest XR.  Labs Reviewed  CBC WITH DIFFERENTIAL - Abnormal; Notable for the following:    RBC 3.67 (*)     Hemoglobin 11.0 (*)     HCT 33.2 (*)     All other components within normal limits  COMPREHENSIVE METABOLIC PANEL - Abnormal; Notable for  the following:    Glucose, Bld 180 (*)     Albumin 3.4 (*)     Total Bilirubin 0.2 (*)     GFR calc non Af Amer 65 (*)     GFR calc Af Amer 75 (*)     All other components within normal limits  URINALYSIS, ROUTINE W REFLEX MICROSCOPIC - Abnormal; Notable for the following:    Leukocytes, UA SMALL (*)     All other components within normal limits  URINE MICROSCOPIC-ADD ON - Abnormal; Notable for the following:    Squamous Epithelial / LPF FEW (*)     All other components within normal limits  TROPONIN I   Results for orders placed during the hospital encounter of 02/07/12  CBC WITH DIFFERENTIAL      Component Value Range   WBC 5.7  4.0 - 10.5 K/uL   RBC 3.67 (*) 3.87 - 5.11 MIL/uL   Hemoglobin 11.0 (*) 12.0 - 15.0 g/dL   HCT 62.9 (*) 52.8 - 41.3 %   MCV 90.5  78.0 - 100.0 fL   MCH 30.0  26.0 - 34.0 pg   MCHC 33.1  30.0 - 36.0 g/dL   RDW 24.4  01.0 - 27.2 %   Platelets 298  150 - 400 K/uL   Neutrophils Relative 66  43 - 77 %   Neutro Abs 3.7  1.7 - 7.7 K/uL   Lymphocytes Relative 22  12 - 46 %   Lymphs Abs  1.3  0.7 - 4.0 K/uL   Monocytes Relative 6  3 - 12 %   Monocytes Absolute 0.3  0.1 - 1.0 K/uL   Eosinophils Relative 5  0 - 5 %   Eosinophils Absolute 0.3  0.0 - 0.7 K/uL   Basophils Relative 1  0 - 1 %   Basophils Absolute 0.1  0.0 - 0.1 K/uL  COMPREHENSIVE METABOLIC PANEL      Component Value Range   Sodium 138  135 - 145 mEq/L   Potassium 4.7  3.5 - 5.1 mEq/L   Chloride 101  96 - 112 mEq/L   CO2 27  19 - 32 mEq/L   Glucose, Bld 180 (*) 70 - 99 mg/dL   BUN 22  6 - 23 mg/dL   Creatinine, Ser 5.36  0.50 - 1.10 mg/dL   Calcium 9.6  8.4 - 64.4 mg/dL   Total Protein 6.7  6.0 - 8.3 g/dL   Albumin 3.4 (*) 3.5 - 5.2 g/dL   AST 26  0 - 37 U/L   ALT 23  0 - 35 U/L   Alkaline Phosphatase 116  39 - 117 U/L   Total Bilirubin 0.2 (*) 0.3 - 1.2 mg/dL   GFR calc non Af Amer 65 (*) >90 mL/min   GFR calc Af Amer 75 (*) >90 mL/min  TROPONIN I      Component Value Range   Troponin I <0.30  <0.30 ng/mL  URINALYSIS, ROUTINE W REFLEX MICROSCOPIC      Component Value Range   Color, Urine YELLOW  YELLOW   APPearance CLEAR  CLEAR   Specific Gravity, Urine 1.020  1.005 - 1.030   pH 6.0  5.0 - 8.0   Glucose, UA NEGATIVE  NEGATIVE mg/dL   Hgb urine dipstick NEGATIVE  NEGATIVE   Bilirubin Urine NEGATIVE  NEGATIVE   Ketones, ur NEGATIVE  NEGATIVE mg/dL   Protein, ur NEGATIVE  NEGATIVE mg/dL   Urobilinogen, UA 0.2  0.0 - 1.0 mg/dL  Nitrite NEGATIVE  NEGATIVE   Leukocytes, UA SMALL (*) NEGATIVE  URINE MICROSCOPIC-ADD ON      Component Value Range   Squamous Epithelial / LPF FEW (*) RARE   WBC, UA 11-20  <3 WBC/hpf   RBC / HPF 0-2  <3 RBC/hpf   Bacteria, UA RARE  RARE    Dg Chest Port 1 View  02/07/2012  *RADIOLOGY REPORT*  Clinical Data: Nausea and vomiting  PORTABLE CHEST - 1 VIEW  Comparison: Chest radiograph 01/22/2012  Findings: Power port in the right anterior chest wall.  Sternotomy wires overlie enlarged cardiac silhouette. .  No effusion, infiltrate, or pneumothorax.  IMPRESSION: No  acute cardiopulmonary process.   Original Report Authenticated By: Genevive Bi, M.D.     Date: 02/07/2012  Rate: 82  Rhythm: normal sinus rhythm  QRS Axis: normal  Intervals: normal  ST/T Wave abnormalities: nonspecific T wave changes  Conduction Disutrbances:none  Narrative Interpretation:   Old EKG Reviewed: unchanged No salient change in EKG compared to 01/21/2012. Today's EKG does have prolonged QT.   1. Nausea & vomiting       MDM   Urine culture sent for the urinalysis patient without any urinary tract type symptoms. Vomiting improved with Zofran in the emergency room patient feels better with fluid hydration. Will start on the antibiotic Keflex. Patient will followup with her primary care Dr. Return for any new or worse symptoms. Abdomen soft nontender no leukocytosis no significant electrolyte or liver function abnormalities.  Correction patient will be treated with IV Cipro and then discharged home with oral Cipro she hasn't had an allergy. Urine culture is pending.   Urinalysis did have a few 2 cells a did not have many possible could be contaminate is also possible could be urinary tract infection. Hospitalist will come and evaluate the patient my plan was a observation admission and some IV antibiotics continue IV fluids antinausea medicine have Dr. Ouida Sills reassess in the morning.     I personally performed the services described in this documentation, which was scribed in my presence. The recorded information has been reviewed and is accurate.          Shelda Jakes, MD 02/08/12 0000

## 2012-02-07 NOTE — ED Notes (Signed)
Pt states she needs to get up and use the restroom and she is unable to use a bedpan.  Pt assisted x2 to bedside, pt states she is unable to stand and falls back onto the bed, MD made aware.  Pt states she is very weak, will not hold her arms up when requested, however is able to do so when distracted.

## 2012-02-08 DIAGNOSIS — R1110 Vomiting, unspecified: Secondary | ICD-10-CM | POA: Diagnosis present

## 2012-02-08 DIAGNOSIS — N390 Urinary tract infection, site not specified: Secondary | ICD-10-CM

## 2012-02-08 DIAGNOSIS — E119 Type 2 diabetes mellitus without complications: Secondary | ICD-10-CM

## 2012-02-08 DIAGNOSIS — R112 Nausea with vomiting, unspecified: Secondary | ICD-10-CM

## 2012-02-08 LAB — CBC
HCT: 30.6 % — ABNORMAL LOW (ref 36.0–46.0)
Hemoglobin: 10.3 g/dL — ABNORMAL LOW (ref 12.0–15.0)
MCH: 30.3 pg (ref 26.0–34.0)
MCHC: 33.7 g/dL (ref 30.0–36.0)
MCV: 90 fL (ref 78.0–100.0)
Platelets: 275 10*3/uL (ref 150–400)
RBC: 3.4 MIL/uL — ABNORMAL LOW (ref 3.87–5.11)
RDW: 13.8 % (ref 11.5–15.5)
WBC: 6.6 10*3/uL (ref 4.0–10.5)

## 2012-02-08 LAB — COMPREHENSIVE METABOLIC PANEL
ALT: 17 U/L (ref 0–35)
AST: 21 U/L (ref 0–37)
Albumin: 2.8 g/dL — ABNORMAL LOW (ref 3.5–5.2)
Alkaline Phosphatase: 104 U/L (ref 39–117)
BUN: 16 mg/dL (ref 6–23)
CO2: 27 mEq/L (ref 19–32)
Calcium: 9.2 mg/dL (ref 8.4–10.5)
Chloride: 104 mEq/L (ref 96–112)
Creatinine, Ser: 0.79 mg/dL (ref 0.50–1.10)
GFR calc Af Amer: >90 mL/min (ref >90)
GFR calc non Af Amer: 84 mL/min — ABNORMAL LOW (ref >90)
Glucose, Bld: 114 mg/dL — ABNORMAL HIGH (ref 70–99)
Potassium: 4.4 mEq/L (ref 3.5–5.1)
Sodium: 140 mEq/L (ref 135–145)
Total Bilirubin: 0.4 mg/dL (ref 0.3–1.2)
Total Protein: 6.1 g/dL (ref 6.0–8.3)

## 2012-02-08 LAB — GLUCOSE, CAPILLARY
Glucose-Capillary: 117 mg/dL — ABNORMAL HIGH (ref 70–99)
Glucose-Capillary: 113 mg/dL — ABNORMAL HIGH (ref 70–99)
Glucose-Capillary: 122 mg/dL — ABNORMAL HIGH (ref 70–99)
Glucose-Capillary: 105 mg/dL — ABNORMAL HIGH (ref 70–99)

## 2012-02-08 LAB — TROPONIN I
Troponin I: <0.3 ng/mL (ref <0.30)
Troponin I: <0.3 ng/mL (ref <0.30)
Troponin I: <0.3 ng/mL (ref <0.30)

## 2012-02-08 LAB — MAGNESIUM: Magnesium: 1.8 mg/dL (ref 1.5–2.5)

## 2012-02-08 MED ORDER — POTASSIUM CHLORIDE IN NACL 20-0.9 MEQ/L-% IV SOLN
INTRAVENOUS | Status: DC
  Administered 2012-02-08 – 2012-02-09 (×2): via INTRAVENOUS

## 2012-02-08 MED ORDER — INSULIN ASPART 100 UNIT/ML ~~LOC~~ SOLN
0.0000 [IU] | Freq: Three times a day (TID) | SUBCUTANEOUS | Status: DC
  Administered 2012-02-08: 1 [IU] via SUBCUTANEOUS
  Administered 2012-02-09: 2 [IU] via SUBCUTANEOUS
  Administered 2012-02-09 – 2012-02-10 (×2): 1 [IU] via SUBCUTANEOUS

## 2012-02-08 MED ORDER — SODIUM CHLORIDE 0.9 % IJ SOLN
3.0000 mL | Freq: Two times a day (BID) | INTRAMUSCULAR | Status: DC
  Administered 2012-02-08 – 2012-02-09 (×2): 3 mL via INTRAVENOUS

## 2012-02-08 MED ORDER — FLUOXETINE HCL 20 MG PO CAPS
20.0000 mg | ORAL_CAPSULE | Freq: Two times a day (BID) | ORAL | Status: DC
  Filled 2012-02-08: qty 1
  Filled 2012-02-08 (×3): qty 2

## 2012-02-08 MED ORDER — FLUOXETINE HCL 20 MG PO CAPS
20.0000 mg | ORAL_CAPSULE | Freq: Every day | ORAL | Status: DC
  Administered 2012-02-08 – 2012-02-09 (×2): 20 mg via ORAL
  Filled 2012-02-08 (×2): qty 1

## 2012-02-08 MED ORDER — AMLODIPINE BESYLATE 5 MG PO TABS
5.0000 mg | ORAL_TABLET | Freq: Every morning | ORAL | Status: DC
  Administered 2012-02-08 – 2012-02-10 (×3): 5 mg via ORAL
  Filled 2012-02-08 (×3): qty 1

## 2012-02-08 MED ORDER — INSULIN GLARGINE 100 UNIT/ML ~~LOC~~ SOLN
50.0000 [IU] | Freq: Every day | SUBCUTANEOUS | Status: DC
  Administered 2012-02-08 – 2012-02-09 (×2): 50 [IU] via SUBCUTANEOUS

## 2012-02-08 MED ORDER — METOCLOPRAMIDE HCL 5 MG/ML IJ SOLN
10.0000 mg | Freq: Four times a day (QID) | INTRAMUSCULAR | Status: DC
  Administered 2012-02-08 – 2012-02-09 (×5): 10 mg via INTRAVENOUS
  Filled 2012-02-08 (×4): qty 2

## 2012-02-08 MED ORDER — FLUOXETINE HCL 20 MG PO CAPS
40.0000 mg | ORAL_CAPSULE | Freq: Every day | ORAL | Status: DC
  Administered 2012-02-08 – 2012-02-10 (×3): 40 mg via ORAL
  Filled 2012-02-08: qty 1
  Filled 2012-02-08 (×2): qty 2

## 2012-02-08 MED ORDER — INSULIN ASPART 100 UNIT/ML ~~LOC~~ SOLN
0.0000 [IU] | Freq: Every day | SUBCUTANEOUS | Status: DC
  Administered 2012-02-09: 2 [IU] via SUBCUTANEOUS

## 2012-02-08 MED ORDER — TROLAMINE SALICYLATE 10 % EX CREA: 1.0000 "application " | TOPICAL_CREAM | CUTANEOUS | Status: DC | PRN

## 2012-02-08 MED ORDER — LOSARTAN POTASSIUM 50 MG PO TABS
50.0000 mg | ORAL_TABLET | Freq: Every morning | ORAL | Status: DC
  Administered 2012-02-08 – 2012-02-10 (×3): 50 mg via ORAL
  Filled 2012-02-08 (×5): qty 1

## 2012-02-08 MED ORDER — CIPROFLOXACIN IN D5W 200 MG/100ML IV SOLN
200.0000 mg | Freq: Two times a day (BID) | INTRAVENOUS | Status: DC
  Administered 2012-02-08 – 2012-02-10 (×4): 200 mg via INTRAVENOUS
  Filled 2012-02-08 (×4): qty 100

## 2012-02-08 MED ORDER — TRAZODONE HCL 50 MG PO TABS
50.0000 mg | ORAL_TABLET | Freq: Every evening | ORAL | Status: DC | PRN
  Filled 2012-02-08: qty 1

## 2012-02-08 MED ORDER — ENOXAPARIN SODIUM 40 MG/0.4ML ~~LOC~~ SOLN: 40.0000 mg | SUBCUTANEOUS | Status: DC

## 2012-02-08 MED ORDER — CIPROFLOXACIN IN D5W 200 MG/100ML IV SOLN
INTRAVENOUS | Status: AC
  Filled 2012-02-08: qty 200

## 2012-02-08 MED ORDER — ACETAMINOPHEN 325 MG PO TABS: 650.0000 mg | ORAL_TABLET | ORAL | Status: DC | PRN

## 2012-02-08 MED ORDER — MUSCLE RUB 10-15 % EX CREA: TOPICAL_CREAM | CUTANEOUS | Status: DC | PRN

## 2012-02-08 MED ORDER — ASPIRIN EC 325 MG PO TBEC
325.0000 mg | DELAYED_RELEASE_TABLET | Freq: Every morning | ORAL | Status: DC
  Administered 2012-02-08 – 2012-02-10 (×3): 325 mg via ORAL
  Filled 2012-02-08 (×3): qty 1

## 2012-02-08 MED ORDER — ONDANSETRON HCL 4 MG/2ML IJ SOLN: 4.0000 mg | INTRAMUSCULAR | Status: DC | PRN

## 2012-02-08 MED ORDER — ATENOLOL 25 MG PO TABS
50.0000 mg | ORAL_TABLET | Freq: Every day | ORAL | Status: DC
  Administered 2012-02-08 – 2012-02-09 (×2): 50 mg via ORAL
  Filled 2012-02-08: qty 1
  Filled 2012-02-08 (×2): qty 2

## 2012-02-08 MED ORDER — ASPIRIN EC 325 MG PO TBEC: 325.0000 mg | DELAYED_RELEASE_TABLET | Freq: Every morning | ORAL | Status: DC

## 2012-02-08 MED ORDER — SIMVASTATIN 10 MG PO TABS
10.0000 mg | ORAL_TABLET | Freq: Every day | ORAL | Status: DC
  Administered 2012-02-08: 10 mg via ORAL
  Filled 2012-02-08 (×3): qty 1

## 2012-02-08 MED ORDER — ENOXAPARIN SODIUM 40 MG/0.4ML ~~LOC~~ SOLN
40.0000 mg | SUBCUTANEOUS | Status: DC
  Administered 2012-02-08 – 2012-02-10 (×3): 40 mg via SUBCUTANEOUS
  Filled 2012-02-08 (×3): qty 0.4

## 2012-02-08 NOTE — Progress Notes (Signed)
Subjective: Patient was admitted last night due to recurrent nausea and vomiting. She was found to have UTI and receiving antibiotics. She feels slightly better.  Objective: Vital signs in last 24 hours: Temp:  [97.7 F (36.5 C)-98.2 F (36.8 C)] 98 F (36.7 C) (12/14 0500) Pulse Rate:  [74-88] 79  (12/14 0633) Resp:  [16-20] 18  (12/14 0500) BP: (118-173)/(52-81) 147/72 mmHg (12/14 0633) SpO2:  [90 %-97 %] 96 % (12/14 0500) Weight:  [109.77 kg (242 lb)-112.946 kg (249 lb)] 112.946 kg (249 lb) (12/14 0500) Weight change:  Last BM Date: 02/07/12  Intake/Output from previous day:    PHYSICAL EXAM General appearance: alert and no distress Resp: clear to auscultation bilaterally and chest wall tenderness along her sugical site Cardio: S1, S2 normal GI: soft, non-tender; bowel sounds normal; no masses,  no organomegaly Extremities: extremities normal, atraumatic, no cyanosis or edema  Lab Results:    @labtest @ [facility] No results found for this basename: PHART,PCO2,PO2ART,TCO2,HCO3 in the last 72 hours CULTURES No results found for this or any previous visit (from the past 240 hour(s)). Studies/Results: Dg Chest Port 1 View  02/07/2012  *RADIOLOGY REPORT*  Clinical Data: Nausea and vomiting  PORTABLE CHEST - 1 VIEW  Comparison: Chest radiograph 01/22/2012  Findings: Power port in the right anterior chest wall.  Sternotomy wires overlie enlarged cardiac silhouette. .  No effusion, infiltrate, or pneumothorax.  IMPRESSION: No acute cardiopulmonary process.   Original Report Authenticated By: Genevive Bi, M.D.     Medications: I have reviewed the patient's current medications.  Assesment: UTI Nausea & Vomiting secondary to the above Active Problems:  HYPERTENSION, BENIGN  CAD, NATIVE VESSEL  DIASTOLIC HEART FAILURE, CHRONIC  CHEST PAIN UNSPECIFIED  Obesity, Class III, BMI 40-49.9 (morbid obesity)  S/P CABG x 3  Vomiting    Plan: Continue Iv cipro Symptomatic  treatment Continue regular treatment    LOS: 1 day   Kyrene Longan 02/08/2012, 9:53 AM

## 2012-02-08 NOTE — H&P (Signed)
[facility] History and Physical  [name]  WUJ:[id number]  DOB: [birth year]   DOA: 02/08/2012   PCP:   Laura Perches, MD   Chief Complaint:  Vomiting since this evening  HPI: [name] is an 66 y.o. female.   Obese Caucasian lady with multiple medical problems, status post CABG about 3 weeks ago, presents to the emergency room tonight, complaining of persistent vomiting, starting about 6 PM. There is no blood in the vomitus, there is no diarrhea, she denies fever or chills. It was associated with an episode of diaphoresis, and it has left her feeling very weak. She does not feel as if she can manage at home by herself.  In the emergency room patient was evaluated, and an abnormal urinalysis was reported, suggestive of urinary tract infection, and the hospitalist service was called to assist with management.  Patient denies frank chest pain, and reports her cardiac symptoms much improved since her CABG.  She suffers from fibromyalgia and has chronic generalized pains and tenderness to chest abdomen and limbs.  Rewiew of Systems:   All systems negative except as marked bold or noted in the HPI;  Constitutional: for malaise, fever and chills. ;  Eyes:  for eye pain, redness and discharge. ;  ENMT:  for ear pain, hoarseness, nasal congestion, sinus pressure and sore throat. ;  Cardiovascular:  for chest pain, palpitations, dyspnea and peripheral edema. ;  Respiratory:  for cough, hemoptysis, wheezing and stridor. ;  Gastrointestinal:  for nausea, vomiting, diarrhea, constipation, abdominal pain, melena, blood in stool, hematemesis, jaundice and rectal bleeding. unusual weight loss..   Genitourinary:  for frequency, dysuria, incontinence,flank pain and hematuria; Musculoskeletal: for back pain and neck pain. Negative for swelling and trauma.;  Skin: .  pruritus, rash, abrasions, bruising and skin lesion.; ulcerations Neuro:  headache, lightheadedness and neck stiffness.  Negative for weakness, altered level of consciousness , altered mental status, extremity weakness, burning feet, involuntary movement, seizure and syncope.  Psych: for anxiety, depression, insomnia, tearfulness, panic attacks, hallucinations, paranoia, suicidal or homicidal ideation    Past Medical History  Diagnosis Date  . Coronary artery disease     a. s/p stent to lad 2006 w subsequent kissing balloon pci to diagonal;  b.  pci of the lad w a drug-eluting stent July 2010;  c.  LHC 11/13/11: p-mLAD stent with prox 50% ISR, 80% beyond dist edge of stent, m+dLAD beyond stent 40-50%, mod caliber pD1 80%, mRCA 70%, m-dRCA 20-30%, EF 55-65% => med Rx (consider CABG)  . Diabetes mellitus   . Hypertension   . Dyslipidemia   . Morbid obesity   . GERD (gastroesophageal reflux disease)   . Varicose vein   . Gastroparesis   . Osteoarthritis   . Blurred vision   . Acoustic neuroma     left ear  . Anxiety   . Depressed   . Asthma     AS CHILD   . Sleep apnea     12+ YRS NO MACHINE GOTTEN   . Anginal pain     DR [name]    . Headache   . Fibromyalgia   . Hiatal hernia   . Adenocarcinoma     ascending colon, LNs pos -- S/P right hemicolectomy, xeloda completed 6/10 (dr. Cleone [name])  . Morbid obesity     Past Surgical History  Procedure Date  . Cataract extraction   . Fetal surgery for congenital hernia   . Knee arthroscopy   . Replacement  total knee   . Tonsillectomy   . Hemicolectomy 12/28/07    right for adenocarcinoma of ascending colon  . Hysterotomy 1993  . Herniaorraphy 01/22/06    for recurrent ventral hernia  . Laparotomy     for small bowl obstruction  . Resecton of acoustic neuroma   . Stent surgery   . Abdominal hysterectomy 1993  . Cholecystectomy 1966  . Hernia repair 1990, 2002, 2007    open x1, lap x2   . Colon surgery   . Portacath placement   . Excision morton's neuroma   . Appendectomy   . Cardiac catheterization   . Lesion removal 10/08/2011     Procedure: LESION REMOVAL;  Surgeon: Laura Mclean, MD;  Location: [facility]S;  Service: Orthopedics;  Laterality: Right;  Removal Lesion Right Long Finger  . Back surgery   . Eye surgery     cataract on right 2005, left 2008  . Joint replacement 2005 and 2006    right 2005, left 2006  . Finger replantation     RECENTLY   . Mandible surgery   . Coronary artery bypass graft 01/20/2012    Procedure: CORONARY ARTERY BYPASS GRAFTING (CABG);  Surgeon: Alleen Borne, MD;  Location: [facility] OR;  Service: Open Heart Surgery;  Laterality: N/A;  Coronary artery bypass graft times three utilizing the left internal mammary artery and the right greater saphenous vein harvested endoscopically  . Endovein harvest of greater saphenous vein 01/20/2012    Procedure: ENDOVEIN HARVEST OF GREATER SAPHENOUS VEIN;  Surgeon: Alleen Borne, MD;  Location: [facility] OR;  Service: Open Heart Surgery;  Laterality: Right;    Medications:  HOME MEDS: Prior to Admission medications   Medication Sig Start Date End Date Taking? Authorizing Provider  amLODipine (NORVASC) 5 MG tablet Take 5 mg by mouth every morning.   Yes Historical Provider, MD  aspirin 325 MG EC tablet Take 325 mg by mouth every morning. 01/25/12  Yes Donielle Margaretann Loveless, PA  atenolol (TENORMIN) 50 MG tablet Take 50 mg by mouth at bedtime. 01/30/12  Yes Historical Provider, MD  FLUoxetine (PROZAC) 20 MG capsule Take 20-40 mg by mouth 2 (two) times daily. Takes two capsules in the morning and one capsule at bedtime   Yes Historical Provider, MD  furosemide (LASIX) 20 MG tablet Take 20 mg by mouth every morning. 01/25/12  Yes Donielle Margaretann Loveless, PA  hydrocortisone cream 1 % Apply 1 application topically 3 (three) times daily. 01/25/12  Yes Donielle Margaretann Loveless, PA  insulin glargine (LANTUS) 100 UNIT/ML injection Inject 80 Units into the skin at bedtime.    Yes Historical Provider, MD  losartan (COZAAR) 50 MG tablet Take 50 mg by mouth every morning.   Yes Historical  Provider, MD  metFORMIN (GLUCOPHAGE) 500 MG tablet Take 1,000 mg by mouth 2 (two) times daily.   Yes Historical Provider, MD  methylcellulose (ARTIFICIAL TEARS) 1 % ophthalmic solution Place 1 drop into both eyes as needed. For dry eyes   Yes Historical Provider, MD  Multiple Vitamin (MULTIVITAMIN WITH MINERALS) TABS Take 1 tablet by mouth every morning.    Yes Historical Provider, MD  naproxen sodium (ANAPROX) 220 MG tablet Take 220 mg by mouth every 8 (eight) hours as needed. For pain   Yes Historical Provider, MD  potassium chloride SA (K-DUR,KLOR-CON) 20 MEQ tablet Take 1 tablet (20 mEq total) by mouth 2 (two) times daily. For 5 days then stop 01/25/12  Yes Donielle Margaretann Loveless, PA  pravastatin (PRAVACHOL) 20 MG tablet Take 20 mg by mouth daily.   Yes Historical Provider, MD  promethazine (PHENERGAN) 12.5 MG tablet Take 12.5-25 mg by mouth daily as needed. For nausea and vomiting   Yes Historical Provider, MD  trolamine salicylate (ASPERCREME) 10 % cream Apply 1 application topically as needed. For pain in knees   Yes Historical Provider, MD  ciprofloxacin (CIPRO) 500 MG tablet Take 1 tablet (500 mg total) by mouth 2 (two) times daily. 02/07/12   Shelda Jakes, MD  Insulin Lispro, Human, (HUMALOG PEN [place]) Inject 15 Units into the skin 2 (two) times daily.     Historical Provider, MD  ondansetron (ZOFRAN ODT) 8 MG disintegrating tablet Take 1 tablet (8 mg total) by mouth every 8 (eight) hours as needed for nausea. 02/07/12   Shelda Jakes, MD     Allergies:  Allergies  Allergen Reactions  . Latex Itching    Only where touched on body with it.  . Morphine Shortness Of Breath, Itching and Other (See Comments)    Makes patient feel as if her "insides" are on fire.  . Adhesive (Tape) Other (See Comments)    Patient states that she breaks out in blisters  . Codeine Itching and Rash    All over body.  . Penicillins Itching and Rash    Localized.    Social History:   reports that  she has never smoked. She does not have any smokeless tobacco history on file. She reports that she does not drink alcohol or use illicit drugs.  Family History: Family History  Problem Relation Age of Onset  . Coronary artery disease Other     family history  . Asthma Other     family history-grandfather  . Colon cancer Other     family hx  . Diabetes Maternal Grandmother     unspecif if Maternal or Paternal  . Heart failure Mother   . Cirrhosis Father      Physical Exam: Filed Vitals:   02/08/12 0500 02/08/12 0631 02/08/12 0632 02/08/12 0633  BP: 140/65 164/74 146/74 147/72  Pulse: 77 80 84 79  Temp: 98 F (36.7 C)     TempSrc: Oral     Resp: 18     Height:      Weight: 112.946 kg (249 lb)     SpO2: 96%      Blood pressure 147/72, pulse 79, temperature 98 F (36.7 C), temperature source Oral, resp. rate 18, height 5' 5.5" (1.664 m), weight 112.946 kg (249 lb), SpO2 96.00%.  GEN:  Pleasant elderly obese Caucasian lady lying in the stretcher in no acute distress; cooperative with exam PSYCH:  alert and oriented x4; appears somewhat anxious HEENT: Mucous membranes pink and anicteric; PERRLA; EOM intact; no cervical lymphadenopathy nor thyromegaly or carotid bruit; no JVD; Breasts:: Not examined CHEST WALL: Tenderness over chest generally CHEST: Normal respiration, clear to auscultation bilaterally HEART: Regular rate and rhythm; no murmurs rubs or gallops BACK: No kyphosis or scoliosis; no CVA tenderness ABDOMEN: Obese, diffuse superficial tenderness, normal abdominal bowel sounds; ; no intertriginous candida. Rectal Exam: Not done EXTREMITIES: Tenderness of the limb; age-appropriate arthropathy of the hands and knees; no edema; no ulcerations. Genitalia: not examined PULSES: 2+ and symmetric SKIN: Healing surgical wounds and chest and upper abdomen associated with recent CABG CNS: Cranial nerves 2-12 grossly intact no focal lateralizing neurologic deficit   Labs on  Admission:  Basic Metabolic Panel:  Lab 02/07/12 1610  NA  138  K 4.7  CL 101  CO2 27  GLUCOSE 180*  BUN 22  CREATININE 0.89  CALCIUM 9.6  MG --  PHOS --   Liver Function Tests:  Lab 02/07/12 2037  AST 26  ALT 23  ALKPHOS 116  BILITOT 0.2*  PROT 6.7  ALBUMIN 3.4*   No results found for this basename: LIPASE:5,AMYLASE:5 in the last 168 hours No results found for this basename: AMMONIA:5 in the last 168 hours CBC:  Lab 02/08/12 0640 02/07/12 2037  WBC 6.6 5.7  NEUTROABS -- 3.7  HGB 10.3* 11.0*  HCT 30.6* 33.2*  MCV 90.0 90.5  PLT 275 298   Cardiac Enzymes:  Lab 02/08/12 0143 02/07/12 2037  CKTOTAL -- --  CKMB -- --  CKMBINDEX -- --  TROPONINI <0.30 <0.30   BNP: No components found with this basename: POCBNP:5 D-dimer: No components found with this basename: D-DIMER:5 CBG: No results found for this basename: GLUCAP:5 in the last 168 hours  Radiological Exams on Admission: Dg Chest Port 1 View  02/07/2012  *RADIOLOGY REPORT*  Clinical Data: Nausea and vomiting  PORTABLE CHEST - 1 VIEW  Comparison: Chest radiograph 01/22/2012  Findings: Power port in the right anterior chest wall.  Sternotomy wires overlie enlarged cardiac silhouette. .  No effusion, infiltrate, or pneumothorax.  IMPRESSION: No acute cardiopulmonary process.   Original Report Authenticated By: Genevive Bi, M.D.     EKG: Independently reviewed. Sinus rhythm no acute ischemic changes noted   Assessment/Plan Present on Admission:  . Vomiting Possible urinary tract infection  . HYPERTENSION, BENIGN . CAD, NATIVE VESSEL . DIASTOLIC HEART FAILURE, CHRONIC . CHEST PAIN UNSPECIFIED . Obesity, Class III, BMI 40-49.9 (morbid obesity)   PLAN: Probable GI upset  of unclear cause; Will bring this lady in for short-term supportive care; will give a short course of IV fluids [6 hours], and temporarily hold her Lasix. Will give antiemetics, we'll cycle her cardiac enzymes. To rule out acute  cardiac event as a cause of her vomiting.  Although her urinalysis is contaminated, will continue empiric antibiotic treatment for possible urinary tract infection, pending results of cultures.  Continue Lantus at a reduced dose while she is vomiting, and give sensitive sliding scale coverage. She reports she is ordered taken to Friday night dose of Lantus.  Patient will be followed by her primary care team later this morning    Other plans as per orders.  Code Status: FULL CODE  Family Communication:  Disposition Plan: Per primary team    Lazer Wollard Nocturnist [facility] Pager [phone]   02/08/2012, 7:18 AM

## 2012-02-08 NOTE — ED Notes (Signed)
Pt states she is feeling much better, states she is glad she is staying at the hospital, states she was afraid to go home by herself.

## 2012-02-08 NOTE — Progress Notes (Signed)
ANTIBIOTIC CONSULT NOTE - INITIAL  Pharmacy Consult for  IV Cipro Indication:  Suspected UTI  Allergies  Allergen Reactions  . Latex Itching    Only where touched on body with it.  . Morphine Shortness Of Breath, Itching and Other (See Comments)    Makes patient feel as if her "insides" are on fire.  . Adhesive (Tape) Other (See Comments)    Patient states that she breaks out in blisters  . Codeine Itching and Rash    All over body.  . Penicillins Itching and Rash    Localized.    Patient Measurements: Height: 5' 5.5" (166.4 cm) Weight: 242 lb (109.77 kg) IBW/kg (Calculated) : 58.15  Adjusted Body Weight: = 75 kg  Vital Signs: Temp: 98.2 F (36.8 C) (12/13 1952) Temp src: Oral (12/13 1952) BP: 118/81 mmHg (12/14 0130) Pulse Rate: 77  (12/14 0130) Intake/Output from previous day:   Intake/Output from this shift:    Labs:  [facility] 02/07/12 2037  WBC 5.7  HGB 11.0*  PLT 298  LABCREA --  CREATININE 0.89   Estimated Creatinine Clearance: 75.3 ml/min (by C-G formula based on Cr of 0.89).   Microbiology: Recent Results (from the past 720 hour(s))  SURGICAL PCR SCREEN     Status: Abnormal   Collection Time   01/16/12  3:01 PM      Component Value Range Status Comment   MRSA, PCR NEGATIVE  NEGATIVE Final    Staphylococcus aureus POSITIVE (*) NEGATIVE Final   URINE CULTURE     Status: Normal   Collection Time   01/16/12  3:01 PM      Component Value Range Status Comment   Specimen Description URINE, CLEAN CATCH   Final    Special Requests NONE   Final    Culture  Setup Time 01/16/2012 16:42   Final    Colony Count 95,000 COLONIES/ML   Final    Culture     Final    Value: Multiple bacterial morphotypes present, none predominant. Suggest appropriate recollection if clinically indicated.   Report Status 01/18/2012 FINAL   Final     Medical History: Past Medical History  Diagnosis Date  . Coronary artery disease     a. s/p stent to lad 2006 w subsequent  kissing balloon pci to diagonal;  b.  pci of the lad w a drug-eluting stent July 2010;  c.  LHC 11/13/11: p-mLAD stent with prox 50% ISR, 80% beyond dist edge of stent, m+dLAD beyond stent 40-50%, mod caliber pD1 80%, mRCA 70%, m-dRCA 20-30%, EF 55-65% => med Rx (consider CABG)  . Diabetes mellitus   . Hypertension   . Dyslipidemia   . Morbid obesity   . GERD (gastroesophageal reflux disease)   . Varicose vein   . Gastroparesis   . Osteoarthritis   . Blurred vision   . Acoustic neuroma     left ear  . Anxiety   . Depressed   . Asthma     AS CHILD   . Sleep apnea     12+ YRS NO MACHINE GOTTEN   . Anginal pain     DR Gwynneth Macleod    . Headache   . Fibromyalgia   . Hiatal hernia   . Adenocarcinoma     ascending colon, LNs pos -- S/P right hemicolectomy, xeloda completed 6/10 (dr. Cleone Slim)  . Morbid obesity     Medications:  Scheduled:    . amLODipine  5 mg Oral q morning - 10a  .  aspirin  325 mg Oral q morning - 10a  . atenolol  50 mg Oral QHS  . enoxaparin (LOVENOX) injection  40 mg Subcutaneous Q24H  . FLUoxetine  20-40 mg Oral BID  . insulin aspart  0-5 Units Subcutaneous QHS  . insulin aspart  0-9 Units Subcutaneous TID WC  . insulin glargine  50 Units Subcutaneous QHS  . losartan  50 mg Oral q morning - 10a  . metoCLOPramide (REGLAN) injection  10 mg Intravenous Q6H  . [COMPLETED] ondansetron  4 mg Intravenous Once  . [COMPLETED] ondansetron  4 mg Intravenous Once  . simvastatin  10 mg Oral q1800  . sodium chloride  3 mL Intravenous Q12H   Infusions:    . 0.9 % NaCl with KCl 20 mEq / L    . [COMPLETED] ciprofloxacin Stopped (02/08/12 0122)  . [COMPLETED] sodium chloride Stopped (02/07/12 2135)  . [COMPLETED] sodium chloride Stopped (02/07/12 2307)  . [DISCONTINUED] sodium chloride 100 mL/hr at 02/07/12 2141   PRN: acetaminophen, ondansetron (ZOFRAN) IV, traZODone, trolamine salicylate  Assessment: Diabetic obese patient with hx of CA-colon and cardivascular  dx, to be treated for probable UTI.  Patient with PCN allergy.  Because of N/V, will start IV cipro and then switch to oral cipro for discharge.  Goal of Therapy:  37yr female patient with CrCl 50-41ml/min for UTI coverage will require IV Cipro 200mg  q12h, and then be switched to oral tx of 250mg  PO q12h.  Plan:  1.  IV Cipro 200mg  q12h 2. Monitor I/O's, and check culture results  Breylon Sherrow, Lloyd Huger E 02/08/2012,1:51 AM

## 2012-02-09 LAB — GLUCOSE, CAPILLARY
Glucose-Capillary: 127 mg/dL — ABNORMAL HIGH (ref 70–99)
Glucose-Capillary: 156 mg/dL — ABNORMAL HIGH (ref 70–99)

## 2012-02-09 MED ORDER — ONDANSETRON HCL 4 MG/2ML IJ SOLN
4.0000 mg | Freq: Four times a day (QID) | INTRAMUSCULAR | Status: DC | PRN
  Administered 2012-02-09: 4 mg via INTRAVENOUS
  Filled 2012-02-09: qty 2

## 2012-02-10 LAB — URINE CULTURE: Colony Count: 9000

## 2012-02-10 LAB — GLUCOSE, CAPILLARY

## 2012-02-10 MED ORDER — CIPROFLOXACIN HCL 500 MG PO TABS: 500.0000 mg | ORAL_TABLET | Freq: Two times a day (BID) | ORAL | Status: DC

## 2012-02-10 NOTE — Discharge Summary (Signed)
NAMEALAN, [name]                ACCOUNT NO.:  [id number]  MEDICAL RECORD NO.:  [id number]  LOCATION:  A318                          FACILITY:  [facility]  PHYSICIAN:  Kingsley Callander. Ouida Sills, MD       DATE OF BIRTH:  06-May-1945  DATE OF ADMISSION:  02/07/2012 DATE OF DISCHARGE:  12/16/2013LH                              DISCHARGE SUMMARY   DISCHARGE DIAGNOSES: 1. Gastroenteritis. 2. Urinary tract infection. 3. Coronary artery disease status post recent bypass. 4. Diabetes. 5. History of colon cancer. 6. Depression and anxiety. 7. Sleep apnea.  DISCHARGE MEDICATIONS: 1. Amlodipine 5 mg daily. 2. Aspirin 325 mg daily. 3. Atenolol 50 mg daily. 4. Cipro 500 mg b.i.d. for 5 days. 5. Prozac 60 mg daily. 6. Lasix 40 mg daily. 7. Hydrocortisone cream t.i.d. p.r.n. 8. Lantus 60 units daily gradually increasing back to 80 mg daily     based on fasting glucoses. 9. Humalog 15 units before breakfast. 10.Losartan 50 mg daily. 11.Metformin 1000 mg b.i.d. 12.Potassium 20 mEq b.i.d. 13.Pravastatin 20 mg at bedtime. 14.Phenergan 12.5 mg q.4 p.r.n.  HOSPITAL COURSE:  This patient is a 66 year old female who presented with recurrent nausea and vomiting.  She was treated with antiemetics and IV fluids.  She was found to have evidence of possible UTI and was treated with IV ceftriaxone.  She was not febrile.  Her white count on admission was 5.7.  The patient was recovering from recent bypass surgery.  She had cardiac enzymes which were normal.  LFTs were normal.  Chest x-ray revealed stable post bypass changes.  Her nausea and vomiting resolved.  Her diet was advanced without difficulty.  She was improved and stable for discharge on the morning of the 16th.  She will be seen in followup in my office in 1 week.  She will be continued on oral antibiotics in the form of Cipro 500 mg b.i.d. pending her culture results.  She will continue her current insulin regimen and will gradually increase Lantus  back to her baseline dose of 80 units daily as her diet allows and as her glucoses are checked.     Kingsley Callander. Ouida Sills, MD     ROF/MEDQ  D:  02/10/2012  T:  02/10/2012  Job:  [id number]

## 2012-02-10 NOTE — Progress Notes (Signed)
NAMEZYARA, RILING                ACCOUNT NO.:  [id number]  MEDICAL RECORD NO.:  [id number]  LOCATION:  A318                          FACILITY:  [facility]  PHYSICIAN:  Kingsley Callander. Ouida Sills, MD       DATE OF BIRTH:  [birth year]  DATE OF PROCEDURE:  02/09/2012 DATE OF DISCHARGE:                                PROGRESS NOTE   SUBJECTIVE:  The patient was admitted Friday evening with recurrent nausea and vomiting.  She has recently been recovering from her bypass surgery.  She has been at home from the hospital for about a week.  She had not experienced hematemesis.  She denied diarrhea melena or rectal bleeding.  She had not eaten any unusual foods or had not eaten in any unusual restaurants.  She is feeling much better now.  She has been on IV fluids and Reglan from Friday until this morning.  She has not vomited over the past day.  She has remained afebrile.  OBJECTIVE:  VITAL SIGNS:  Temperature 97.6, pulse 72, blood pressure 151/70. GENERAL:  Alert and in no distress. LUNGS:  Clear. HEART:  Regular with no murmurs. CHEST:  Her sternotomy wound is healing well. ABDOMEN:  Soft and nondistended, nontender with no palpable organomegaly. EXTREMITIES:  No edema.  IMPRESSION/PLAN: 1. Gastroenteritis.  Reglan will be discontinued.  We will use Zofran     p.r.n.  We will advance her diet from clear liquids to a bland     diet.  Her white count was 6.6 initially with hemoglobin of 10.3. 2. Coronary heart disease, status post recent bypass surgery, doing     well. 3. Diabetes.  Lantus has been decreased from 80 units to 50 units     daily.  Her glucose fasting this morning is 127.  She had excellent     control of her glucoses yesterday with this dose of Lantus and     sliding scale NovoLog (actually, she has not required any NovoLog,     well controlled as her glucoses have been). 4. History of colon cancer.     Kingsley Callander. Ouida Sills, MD     ROF/MEDQ  D:  02/09/2012  T:  02/10/2012  Job:   [id number]

## 2012-02-10 NOTE — Progress Notes (Signed)
Patient with orders to be discharged to home. Discharge instructions given, pt verbalized understanding via teach back method. Patient in stable condition upon discharge. patient in left private vehicle with friend.

## 2012-02-10 NOTE — Progress Notes (Signed)
[name], [name]                ACCOUNT NO.:  [id number]  MEDICAL RECORD NO.:  [id number]  LOCATION:  A318                          FACILITY:  [facility]  PHYSICIAN:  Kingsley Callander. Ouida Sills, MD       DATE OF BIRTH:  25-Feb-1946  DATE OF PROCEDURE:  02/09/2012 DATE OF DISCHARGE:                                PROGRESS NOTE   ADDENDUM  She has also been found to possibly have a UTI.  She denies dysuria. Her urinalysis revealed 11-20 wbc's, 0-2 rbc's, and a few bacteria.  She had a small leukocyte esterase and negative nitrite.  I cannot find a urine culture in the computer, for that one will be ordered now.  She has been on IV Cipro which will be continued.     Kingsley Callander. Ouida Sills, MD     ROF/MEDQ  D:  02/09/2012  T:  02/10/2012  Job:  [id number]

## 2012-02-11 LAB — GLUCOSE, CAPILLARY

## 2012-02-12 ENCOUNTER — Other Ambulatory Visit: Payer: Self-pay | Admitting: *Deleted

## 2012-02-12 DIAGNOSIS — I2510 Atherosclerotic heart disease of native coronary artery without angina pectoris: Secondary | ICD-10-CM

## 2012-02-12 DIAGNOSIS — Z951 Presence of aortocoronary bypass graft: Secondary | ICD-10-CM

## 2012-02-12 NOTE — Progress Notes (Signed)
UR Chart Review Completed  

## 2012-02-14 DIAGNOSIS — Z452 Encounter for adjustment and management of vascular access device: Secondary | ICD-10-CM

## 2012-02-14 DIAGNOSIS — C189 Malignant neoplasm of colon, unspecified: Secondary | ICD-10-CM

## 2012-02-17 ENCOUNTER — Ambulatory Visit (INDEPENDENT_AMBULATORY_CARE_PROVIDER_SITE_OTHER): Payer: Self-pay | Admitting: Physician Assistant

## 2012-02-17 ENCOUNTER — Ambulatory Visit
Admission: RE | Admit: 2012-02-17 | Discharge: 2012-02-17 | Disposition: A | Discharge status: Home / Self Care | Payer: Medicare Other | Source: Ambulatory Visit | Attending: Surgery | Admitting: Surgery

## 2012-02-17 VITALS — BP 152/80 | HR 103 | Resp 16 | Ht 65.0 in | Wt 242.0 lb

## 2012-02-17 DIAGNOSIS — I2510 Atherosclerotic heart disease of native coronary artery without angina pectoris: Secondary | ICD-10-CM

## 2012-02-17 DIAGNOSIS — Z951 Presence of aortocoronary bypass graft: Secondary | ICD-10-CM

## 2012-02-17 MED ORDER — TRAMADOL HCL 50 MG PO TABS: 50.0000 mg | ORAL_TABLET | Freq: Four times a day (QID) | ORAL | Status: DC | PRN

## 2012-02-17 NOTE — Progress Notes (Signed)
  HPI:  Patient returns for routine postoperative follow-up having undergone CABG x3 on 01/20/2012. The patient's early postoperative recovery while in the hospital was notable for hyperglycemia and deconditioning, however the patient declined SNF placement. Since hospital discharge the patient reports she had been doing well until about 2 weeks ago.  She states that she developed a GI Illness requiring admission to the hospital.  Since then she has been having some incisional soreness which can be attributed to forceful bouts of vomiting.  Otherwise the patient states that she feels great.  She is ambulating without difficulty.  She asks when she will be able to drive and if she can have a  Prescription for pain medication.   Current Outpatient Prescriptions  Medication Sig Dispense Refill  . amLODipine (NORVASC) 5 MG tablet Take 5 mg by mouth every morning.      Marland Kitchen aspirin 325 MG EC tablet Take 325 mg by mouth every morning.      Marland Kitchen atenolol (TENORMIN) 50 MG tablet Take 50 mg by mouth at bedtime.      Marland Kitchen FLUoxetine (PROZAC) 20 MG capsule Take 20-40 mg by mouth 2 (two) times daily. Takes two capsules in the morning and one capsule at bedtime      . furosemide (LASIX) 20 MG tablet Take 20 mg by mouth every morning.      . insulin glargine (LANTUS) 100 UNIT/ML injection Inject 80 Units into the skin at bedtime.       . Insulin Lispro, Human, (HUMALOG PEN [place]) Inject 15 Units into the skin 2 (two) times daily.       Marland Kitchen losartan (COZAAR) 50 MG tablet Take 50 mg by mouth every morning.      . metFORMIN (GLUCOPHAGE) 500 MG tablet Take 1,000 mg by mouth 2 (two) times daily.      . Multiple Vitamin (MULTIVITAMIN WITH MINERALS) TABS Take 1 tablet by mouth every morning.       . ondansetron (ZOFRAN ODT) 8 MG disintegrating tablet Take 1 tablet (8 mg total) by mouth every 8 (eight) hours as needed for nausea.  12 tablet  0  . pravastatin (PRAVACHOL) 20 MG tablet Take 20 mg by mouth daily.      . promethazine  (PHENERGAN) 12.5 MG tablet Take 12.5-25 mg by mouth daily as needed. For nausea and vomiting      . traMADol (ULTRAM) 50 MG tablet Take 1 tablet (50 mg total) by mouth every 6 (six) hours as needed for pain.  40 tablet  0    Physical Exam:  BP 152/80  Pulse 103  Resp 16  Ht 5\' 5"  (1.651 m)  Wt 242 lb (109.77 kg)  BMI 40.27 kg/m2  SpO2 95%  Gen: no apparent distress Heart: RRR, sternum stable Lungs: CTA bilaterally Abd: soft non-tender, non-distended Skin: incisions clean and dry  Diagnostic Tests:  CXR: improvement of aeration/atelectasis, no significant pleural effusions present, sternal wires are intact  Impression:  [name] is S/P CABG doing well.  She does have some chest discomfort which is most likely attributed to her recent episodes of vomiting.     Plan:  We will bring the patient back to clinic in 1 month for follow up.  She was instructed she could drive short distances.  She was instructed to obey her sternal precautions.  She was given a prescription for Toradol 50mg  Q 6 prn pain.  She will call our office should have problems sooner.

## 2012-03-17 ENCOUNTER — Ambulatory Visit (INDEPENDENT_AMBULATORY_CARE_PROVIDER_SITE_OTHER): Payer: Medicare Other | Admitting: Cardiovascular Disease

## 2012-03-17 ENCOUNTER — Ambulatory Visit: Payer: Self-pay | Admitting: Surgery

## 2012-03-17 ENCOUNTER — Ambulatory Visit (HOSPITAL_COMMUNITY)
Admission: RE | Admit: 2012-03-17 | Discharge: 2012-03-17 | Disposition: A | Discharge status: Home / Self Care | Payer: Medicare Other | Source: Ambulatory Visit | Attending: Cardiovascular Disease | Admitting: Cardiovascular Disease

## 2012-03-17 ENCOUNTER — Encounter: Payer: Self-pay | Admitting: Cardiovascular Disease

## 2012-03-17 VITALS — BP 147/65 | HR 91 | Ht 65.5 in | Wt 247.8 lb

## 2012-03-17 DIAGNOSIS — R079 Chest pain, unspecified: Secondary | ICD-10-CM | POA: Insufficient documentation

## 2012-03-17 DIAGNOSIS — M25519 Pain in unspecified shoulder: Secondary | ICD-10-CM

## 2012-03-17 DIAGNOSIS — I2510 Atherosclerotic heart disease of native coronary artery without angina pectoris: Secondary | ICD-10-CM

## 2012-03-17 NOTE — Patient Instructions (Addendum)
A chest x-ray takes a picture of the organs and structures inside the chest, including the heart, lungs, and blood vessels. This test can show several things, including, whether the heart is enlarges; whether fluid is building up in the lungs; and whether pacemaker / defibrillator leads are still in place.  You need a left shoulder x-ray.  Your physician wants you to follow-up in: 6 MONTHS with Dr Excell Seltzer.  You will receive a reminder letter in the mail two months in advance. If you don't receive a letter, please call our office to schedule the follow-up appointment.
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
## 2012-03-21 ENCOUNTER — Encounter: Payer: Self-pay | Admitting: Cardiovascular Disease

## 2012-03-23 ENCOUNTER — Ambulatory Visit (INDEPENDENT_AMBULATORY_CARE_PROVIDER_SITE_OTHER): Payer: Self-pay | Admitting: Surgical

## 2012-03-23 VITALS — BP 180/83 | HR 100 | Resp 16 | Ht 65.0 in | Wt 255.0 lb

## 2012-03-23 DIAGNOSIS — Z951 Presence of aortocoronary bypass graft: Secondary | ICD-10-CM

## 2012-03-23 DIAGNOSIS — I2510 Atherosclerotic heart disease of native coronary artery without angina pectoris: Secondary | ICD-10-CM

## 2012-03-23 NOTE — Patient Instructions (Signed)
Encouraged to continue her cardiac rehabilitation with increasing ambulation and exercise as tolerated.

## 2012-03-23 NOTE — Progress Notes (Signed)
[address]            [place] [address]          [phone]       [name] [facility] Medical Record #[id number] Date of Birth: [birth year]  [name], MD [name], MD  Chief Complaint:   PostOp Follow Up Visit   History of Present Illness:    The patient is seen in office followup status post coronary artery bypass grafting x3 on 01/20/2012 by Dr. Laneta Simmers. This was done for severe two-vessel coronary artery disease. She has had a recent hospitalization for gastroenteritis but has recovered from this illness. From a surgical viewpoint she continues to do quite well. She is not having any shortness of breath. She does have some sternal incisional discomfort. Mostly she feels like she has improved energy and activity level although does tire out usually in the early evening. She denies any fevers chills or other constitutional symptoms. She has returned to driving and is doing well in this regard.           History  Smoking status  . Never Smoker   Smokeless tobacco  . Not on file       Allergies  Allergen Reactions  . Latex Itching    Only where touched on body with it.  . Morphine Shortness Of Breath, Itching and Other (See Comments)    Makes patient feel as if her "insides" are on fire.  . Adhesive (Tape) Other (See Comments)    Patient states that she breaks out in blisters  . Codeine Itching and Rash    All over body.  . Penicillins Itching and Rash    Localized.    Current Outpatient Prescriptions  Medication Sig Dispense Refill  . amLODipine (NORVASC) 5 MG tablet Take 5 mg by mouth every morning.      Marland Kitchen aspirin 325 MG EC tablet Take 325 mg by mouth every morning.      Marland Kitchen atenolol (TENORMIN) 50 MG tablet Take 50 mg by mouth at bedtime.      Marland Kitchen FLUoxetine (PROZAC) 20 MG capsule Take 20-40 mg by mouth 2 (two) times daily. Takes two capsules in the morning and one capsule at bedtime      . insulin glargine  (LANTUS) 100 UNIT/ML injection Inject 80 Units into the skin at bedtime.       Marland Kitchen losartan (COZAAR) 50 MG tablet Take 50 mg by mouth every morning.      . metFORMIN (GLUCOPHAGE) 500 MG tablet Take 1,000 mg by mouth 2 (two) times daily.      . Multiple Vitamin (MULTIVITAMIN WITH MINERALS) TABS Take 1 tablet by mouth every morning.       . ondansetron (ZOFRAN ODT) 8 MG disintegrating tablet Take 1 tablet (8 mg total) by mouth every 8 (eight) hours as needed for nausea.  12 tablet  0  . pravastatin (PRAVACHOL) 20 MG tablet Take 20 mg by mouth daily.      . promethazine (PHENERGAN) 12.5 MG tablet Take 12.5-25 mg by mouth daily as needed. For nausea and vomiting      . traMADol (ULTRAM) 50 MG tablet Take 1 tablet (50 mg total) by mouth every 6 (six) hours as needed for pain.  40 tablet  0  . Insulin Lispro, Human, (HUMALOG PEN [place]) Inject 15 Units into the skin 2 (  two) times daily.            Physical Exam: BP 180/83  Pulse 100  Resp 16  Ht 5\' 5"  (1.651 m)  Wt 255 lb (115.667 kg)  BMI 42.43 kg/m2  SpO2 98%  General appearance: alert, cooperative and no distress Heart: regular rate and rhythm Lungs: clear to auscultation bilaterally Extremities: Minor bilateral lower extremity edema Wound: Incisions all well healed without evidence of infection Wounds:  Diagnostic Studies & Laboratory data:         Recent Radiology Findings: She has a chest x-ray from 03/17/2012 which reveals no evidence of acute disease. Lung fields are clear without effusions. Wires are intact.   Recent Labs:     Assessment / Plan:  The patient has recovered nicely from her coronary artery bypass grafting. She cannot afford cardiac rehabilitation but has been going to the [facility] to do some exercise and ambulation. She has followed up with cardiology and we'll continue to do so. We will see again on a when necessary basis.          [name] E 03/23/2012 1:09 PM

## 2012-04-02 DIAGNOSIS — C180 Malignant neoplasm of cecum: Secondary | ICD-10-CM

## 2012-04-02 DIAGNOSIS — C184 Malignant neoplasm of transverse colon: Secondary | ICD-10-CM

## 2012-04-02 DIAGNOSIS — Z452 Encounter for adjustment and management of vascular access device: Secondary | ICD-10-CM

## 2012-04-14 ENCOUNTER — Other Ambulatory Visit: Payer: Self-pay | Admitting: Cardiovascular Disease

## 2012-04-15 NOTE — Telephone Encounter (Signed)
I spoke with the pt and she is not taking this medication.  The pt said she requested a refill for isosorbide in error (she had an old bottle in the cabinet).

## 2012-04-17 DIAGNOSIS — E119 Type 2 diabetes mellitus without complications: Secondary | ICD-10-CM

## 2012-04-17 DIAGNOSIS — I10 Essential (primary) hypertension: Secondary | ICD-10-CM

## 2012-04-17 DIAGNOSIS — I2510 Atherosclerotic heart disease of native coronary artery without angina pectoris: Secondary | ICD-10-CM

## 2012-04-17 DIAGNOSIS — C189 Malignant neoplasm of colon, unspecified: Secondary | ICD-10-CM

## 2012-06-04 DIAGNOSIS — Z452 Encounter for adjustment and management of vascular access device: Secondary | ICD-10-CM

## 2012-06-04 DIAGNOSIS — C189 Malignant neoplasm of colon, unspecified: Secondary | ICD-10-CM

## 2012-07-22 DIAGNOSIS — C180 Malignant neoplasm of cecum: Secondary | ICD-10-CM

## 2012-07-22 DIAGNOSIS — E119 Type 2 diabetes mellitus without complications: Secondary | ICD-10-CM

## 2012-07-22 DIAGNOSIS — C779 Secondary and unspecified malignant neoplasm of lymph node, unspecified: Secondary | ICD-10-CM

## 2012-07-22 DIAGNOSIS — I10 Essential (primary) hypertension: Secondary | ICD-10-CM

## 2012-09-21 DIAGNOSIS — C189 Malignant neoplasm of colon, unspecified: Secondary | ICD-10-CM

## 2012-09-21 DIAGNOSIS — Z452 Encounter for adjustment and management of vascular access device: Secondary | ICD-10-CM

## 2012-10-12 ENCOUNTER — Encounter: Payer: Self-pay | Admitting: Cardiovascular Disease

## 2012-10-12 ENCOUNTER — Ambulatory Visit (INDEPENDENT_AMBULATORY_CARE_PROVIDER_SITE_OTHER): Payer: Medicare Other | Admitting: Cardiovascular Disease

## 2012-10-12 VITALS — BP 112/70 | HR 64 | Ht 65.5 in | Wt 243.0 lb

## 2012-10-12 DIAGNOSIS — I10 Essential (primary) hypertension: Secondary | ICD-10-CM

## 2012-10-12 DIAGNOSIS — I2510 Atherosclerotic heart disease of native coronary artery without angina pectoris: Secondary | ICD-10-CM

## 2012-10-12 NOTE — Patient Instructions (Addendum)
Your physician wants you to follow-up in: 6 MONTHS with Dr Excell Seltzer.  You will receive a reminder letter in the mail two months in advance. If you don't receive a letter, please call our office to schedule the follow-up appointment.  Your physician has recommended you make the following change in your medication: Atenolol (Tenormin)--I will contact you tomorrow to verify how you are currently taking this medication and then we can discuss changes for this medication

## 2012-10-12 NOTE — Progress Notes (Signed)
HPI:  67 year old woman presenting for followup evaluation. She is followed for coronary artery disease. She underwent three-vessel CABG in 2013 in the setting of multivessel disease and diabetes.  The patient complains of generalized fatigue and dizziness. She has not had syncope. She wonders if she is over medicated. She doesn't feel energetic. She's having some chest pain on the left side, but nothing related to exertion. Before bypass surgery her chest pain was in the center and right side of the chest. She denies dyspnea, orthopnea, or PND. She does admit to left leg swelling.  Outpatient Encounter Prescriptions as of 10/12/2012  Medication Sig Dispense Refill  . amLODipine (NORVASC) 5 MG tablet Take 5 mg by mouth every morning.      Marland Kitchen aspirin 325 MG EC tablet Take 325 mg by mouth every morning.      Marland Kitchen atenolol (TENORMIN) 50 MG tablet Take 50 mg by mouth at bedtime.      Marland Kitchen FLUoxetine (PROZAC) 20 MG capsule Take 20-40 mg by mouth 2 (two) times daily. Takes two capsules in the morning and one capsule at bedtime      . insulin glargine (LANTUS) 100 UNIT/ML injection Inject 80 Units into the skin at bedtime.       . Insulin Lispro, Human, (HUMALOG PEN [place]) Inject 15 Units into the skin 2 (two) times daily.       Marland Kitchen losartan (COZAAR) 50 MG tablet Take 50 mg by mouth every morning.      . metFORMIN (GLUCOPHAGE) 500 MG tablet Take 1,000 mg by mouth 2 (two) times daily.      . Multiple Vitamin (MULTIVITAMIN WITH MINERALS) TABS Take 1 tablet by mouth every morning.       . ondansetron (ZOFRAN ODT) 8 MG disintegrating tablet Take 1 tablet (8 mg total) by mouth every 8 (eight) hours as needed for nausea.  12 tablet  0  . pravastatin (PRAVACHOL) 20 MG tablet Take 20 mg by mouth daily.      . promethazine (PHENERGAN) 12.5 MG tablet Take 12.5-25 mg by mouth daily as needed. For nausea and vomiting      . traMADol (ULTRAM) 50 MG tablet Take 1 tablet (50 mg total) by mouth every 6 (six) hours as needed for  pain.  40 tablet  0   No facility-administered encounter medications on file as of 10/12/2012.    Allergies  Allergen Reactions  . Latex Itching    Only where touched on body with it.  . Morphine Shortness Of Breath, Itching and Other (See Comments)    Makes patient feel as if her "insides" are on fire.  . Adhesive [Tape] Other (See Comments)    Patient states that she breaks out in blisters  . Codeine Itching and Rash    All over body.  . Penicillins Itching and Rash    Localized.    Past Medical History  Diagnosis Date  . Coronary artery disease     a. s/p stent to lad 2006 w subsequent kissing balloon pci to diagonal;  b.  pci of the lad w a drug-eluting stent July 2010;  c.  LHC 11/13/11: p-mLAD stent with prox 50% ISR, 80% beyond dist edge of stent, m+dLAD beyond stent 40-50%, mod caliber pD1 80%, mRCA 70%, m-dRCA 20-30%, EF 55-65% => med Rx (consider CABG)  . Diabetes mellitus   . Hypertension   . Dyslipidemia   . Morbid obesity   . GERD (gastroesophageal reflux disease)   . Varicose  vein   . Gastroparesis   . Osteoarthritis   . Blurred vision   . Acoustic neuroma     left ear  . Anxiety   . Depressed   . Asthma     AS CHILD   . Sleep apnea     12+ YRS NO MACHINE GOTTEN   . Anginal pain     DR Gwynneth Macleod    . Headache(784.0)   . Fibromyalgia   . Hiatal hernia   . Adenocarcinoma     ascending colon, LNs pos -- S/P right hemicolectomy, xeloda completed 6/10 (dr. Cleone Slim)  . Morbid obesity     ROS: Negative except as per HPI  BP 112/70  Pulse 64  Ht 5' 5.5" (1.664 m)  Wt 110.224 kg (243 lb)  BMI 39.81 kg/m2  SpO2 96%  PHYSICAL EXAM: Pt is alert and oriented, pleasant overweight woman in NAD HEENT: normal Neck: JVP - normal, carotids 2+= without bruits Lungs: CTA bilaterally CV: RRR without murmur or gallop Abd: soft, NT, Positive BS, no hepatomegaly Ext: 1+ edema on the left common on the right, distal pulses intact and equal Skin: warm/dry no  rash  EKG:  Normal sinus rhythm 64 beats per minute, within normal limits.  ASSESSMENT AND PLAN: 1. Coronary artery disease, native vessel. The patient is status post CABG within the last year. Her chest pain is atypical. I do not think she requires an ischemic evaluation at this point. Her medical therapy was reviewed. She should continue on aspirin and pravastatin.  2. Hypertension. She may be overtreated. Her blood pressure today is 112/70. I recommended that she reduce her atenolol by 50%. She will continue her other antihypertensive medications.  3. Hyperlipidemia. She is followed by Dr. Ouida Sills.  For followup I will see her back in 6 months.  [name] 10/12/2012 10:07 PM

## 2012-11-04 ENCOUNTER — Telehealth: Payer: Self-pay | Admitting: *Deleted

## 2012-11-04 NOTE — Telephone Encounter (Signed)
Spoke with patient by phone and confirmed appointment with Dr. Truett Perna for 01/28/13.  Contact names and phone numbers were provided.

## 2012-11-11 DIAGNOSIS — C787 Secondary malignant neoplasm of liver and intrahepatic bile duct: Secondary | ICD-10-CM

## 2012-11-11 DIAGNOSIS — C189 Malignant neoplasm of colon, unspecified: Secondary | ICD-10-CM

## 2012-11-11 DIAGNOSIS — Z452 Encounter for adjustment and management of vascular access device: Secondary | ICD-10-CM

## 2012-12-28 DIAGNOSIS — Z452 Encounter for adjustment and management of vascular access device: Secondary | ICD-10-CM

## 2012-12-28 DIAGNOSIS — C189 Malignant neoplasm of colon, unspecified: Secondary | ICD-10-CM

## 2013-01-28 ENCOUNTER — Ambulatory Visit: Payer: Medicare Other | Admitting: Oncology

## 2013-01-28 ENCOUNTER — Telehealth: Payer: Self-pay | Admitting: *Deleted

## 2013-01-28 ENCOUNTER — Ambulatory Visit: Payer: Medicare Other
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
## 2013-01-28 NOTE — Telephone Encounter (Signed)
Spoke with patient and confirmed appointment for Jan. 9, 2014.  Confirmed she had contact names, numbers, and directions.

## 2013-01-28 NOTE — Telephone Encounter (Signed)
Attempted to reach patient by phone twice without success.  Unable to leave voice message.  Will try again at another time.  She needs to be re-scheduled with Dr. Truett Perna.

## 2013-01-28 NOTE — Telephone Encounter (Signed)
Pt did not show up for appt today. Called home, she was unable to find a ride to office. Stated she left message on scheduling voicemail to notify office. Will have pt rescheduled.

## 2013-02-08 ENCOUNTER — Encounter: Payer: Self-pay | Admitting: Gastroenterology

## 2013-03-01 ENCOUNTER — Other Ambulatory Visit: Payer: Self-pay | Admitting: Neurology

## 2013-03-01 DIAGNOSIS — G3184 Mild cognitive impairment, so stated: Secondary | ICD-10-CM

## 2013-03-05 ENCOUNTER — Ambulatory Visit (HOSPITAL_BASED_OUTPATIENT_CLINIC_OR_DEPARTMENT_OTHER): Payer: Medicare Other | Admitting: Oncology

## 2013-03-05 ENCOUNTER — Ambulatory Visit (HOSPITAL_BASED_OUTPATIENT_CLINIC_OR_DEPARTMENT_OTHER): Payer: Medicare Other

## 2013-03-05 ENCOUNTER — Encounter: Payer: Self-pay | Admitting: Oncology

## 2013-03-05 ENCOUNTER — Ambulatory Visit: Payer: Medicare Other

## 2013-03-05 VITALS — BP 141/43 | HR 56

## 2013-03-05 VITALS — BP 171/67 | HR 57 | Temp 97.0°F | Resp 18 | Ht 65.5 in | Wt 244.3 lb

## 2013-03-05 DIAGNOSIS — Z95828 Presence of other vascular implants and grafts: Secondary | ICD-10-CM

## 2013-03-05 DIAGNOSIS — E119 Type 2 diabetes mellitus without complications: Secondary | ICD-10-CM

## 2013-03-05 DIAGNOSIS — I10 Essential (primary) hypertension: Secondary | ICD-10-CM

## 2013-03-05 DIAGNOSIS — K7689 Other specified diseases of liver: Secondary | ICD-10-CM

## 2013-03-05 DIAGNOSIS — C184 Malignant neoplasm of transverse colon: Secondary | ICD-10-CM

## 2013-03-05 DIAGNOSIS — C180 Malignant neoplasm of cecum: Secondary | ICD-10-CM

## 2013-03-05 DIAGNOSIS — C779 Secondary and unspecified malignant neoplasm of lymph node, unspecified: Secondary | ICD-10-CM

## 2013-03-05 DIAGNOSIS — C189 Malignant neoplasm of colon, unspecified: Secondary | ICD-10-CM

## 2013-03-05 DIAGNOSIS — C182 Malignant neoplasm of ascending colon: Secondary | ICD-10-CM

## 2013-03-05 LAB — BASIC METABOLIC PANEL (CC13)
ANION GAP: 9 meq/L (ref 3–11)
BUN: 18.4 mg/dL (ref 7.0–26.0)
CHLORIDE: 100 meq/L (ref 98–109)
CO2: 26 mEq/L (ref 22–29)
Calcium: 10.1 mg/dL (ref 8.4–10.4)
Creatinine: 0.8 mg/dL (ref 0.6–1.1)
Glucose: 243 mg/dl — ABNORMAL HIGH (ref 70–140)
POTASSIUM: 4.7 meq/L (ref 3.5–5.1)
SODIUM: 136 meq/L (ref 136–145)

## 2013-03-05 MED ORDER — SODIUM CHLORIDE 0.9 % IJ SOLN
10.0000 mL | INTRAMUSCULAR | Status: DC | PRN
Start: 2013-03-05 — End: 2013-03-05
  Filled 2013-03-05: qty 10

## 2013-03-05 MED ORDER — HEPARIN SOD (PORK) LOCK FLUSH 100 UNIT/ML IV SOLN
500.0000 [IU] | Freq: Once | INTRAVENOUS | Status: AC
  Administered 2013-03-05: 500 [IU] via INTRAVENOUS
  Filled 2013-03-05: qty 5

## 2013-03-05 MED ORDER — HEPARIN SOD (PORK) LOCK FLUSH 100 UNIT/ML IV SOLN
500.0000 [IU] | Freq: Once | INTRAVENOUS | Status: DC
  Filled 2013-03-05: qty 5

## 2013-03-05 MED ORDER — SODIUM CHLORIDE 0.9 % IJ SOLN
10.0000 mL | INTRAMUSCULAR | Status: DC | PRN
  Administered 2013-03-05: 10 mL via INTRAVENOUS
  Filled 2013-03-05: qty 10

## 2013-03-05 NOTE — Progress Notes (Signed)
[facility] New Patient Consult   Referring MD: [name] 68 y.o.  10/26/1945    Reason for Referral: Colon cancer     HPI: [name] has been followed by Dr. Sonny [name] since been diagnosed with stage III colon cancer in 2009. She was then diagnosed with a stage III tumor of the cecum and underwent a right colectomy followed by adjuvant capecitabine.  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 A transverse colectomy confirmed a 2.5 cm tumor extending into the pericolonic adipose tissue with 1 of 16 lymph nodes positive for metastatic adenocarcinoma. She completed 6 cycles of adjuvant FOLFOX. The FOLFOX was discontinued after 6 cycles secondary to neuropathy.  She last underwent a colonoscopy in January of 2012.  In February of 2014 a surveillance CT scan revealed a hypovascular area in the lateral segment of the left hepatic lobe measuring 0.8 cm. This was new from a CT in January of 2013. She was referred for MRI of the abdomen on 07/21/2012. Several subtle areas of relative hypoenhancing were noted adjacent to the left hepatic lobe branch of the portal vein with the largest measuring 1 cm and corresponding to the CT finding. This was not significantly changed in size.  She reports feeling well at present. She was recently diagnosed with Parkinson's disease.  Past Medical History  Diagnosis Date  . Coronary artery disease     a. s/p stent to lad 2006 w subsequent kissing balloon pci to diagonal;  b.  pci of the lad w a drug-eluting stent July 2010;  c.  LHC 11/13/11: p-mLAD stent with prox 50% ISR, 80% beyond dist edge of stent, m+dLAD beyond stent 40-50%, mod caliber pD1 80%, mRCA 70%, m-dRCA 20-30%, EF 55-65% => med Rx (consider CABG)  . Diabetes mellitus   . Hypertension   . Dyslipidemia   . Morbid obesity   . GERD (gastroesophageal reflux disease)   . Varicose vein   . Gastroparesis   .  Osteoarthritis   . Blurred vision   . Acoustic neuroma     left ear  . Anxiety   . Depressed   . Asthma     AS CHILD   . Sleep apnea     12+ YRS NO MACHINE GOTTEN   . Anginal pain     DR [name]    . Headache(784.0)   . Fibromyalgia   . Hiatal hernia   . Adenocarcinoma     ascending colon, LNs pos -- S/P right hemicolectomy, xeloda completed 6/10 (dr. Sonny [name])  .  G0 P0     .    Transverse colon cancer, T3 N1                                                                     January 2011  Past Surgical History  Procedure Laterality Date  . Cataract extraction    . Fetal surgery for congenital hernia    . Knee arthroscopy    . Replacement total knee    . Tonsillectomy    . Hemicolectomy  12/28/07    right for adenocarcinoma of ascending colon  . Hysterotomy  1993  .  Herniaorraphy  01/22/06    for recurrent ventral hernia  . Laparotomy      for small bowl obstruction  . Resecton of acoustic neuroma    . Stent surgery    . Abdominal hysterectomy  1993  . Cholecystectomy  1966  . Hernia repair  1990, 2002, 2007    open x1, lap x2   . Colon surgery-partial transverse colectomy    January 2011   . Portacath placement    . Excision morton's neuroma    . Appendectomy    . Cardiac catheterization    . Lesion removal  10/08/2011    Procedure: LESION REMOVAL;  Surgeon: Laura Kava, MD;  Location: [facility];  Service: Orthopedics;  Laterality: Right;  Removal Lesion Right Long Finger  . Back surgery    . Eye surgery      cataract on right 2005, left 2008  . Joint replacement  2005 and 2006    right 2005, left 2006  . Finger replantation      RECENTLY   . Mandible surgery    . Coronary artery bypass graft  01/20/2012    Procedure: CORONARY ARTERY BYPASS GRAFTING (CABG);  Surgeon: Laura Pollack, MD;  Location: [facility];  Service: Open Heart Surgery;  Laterality: N/A;  Coronary artery bypass graft times three utilizing the left internal mammary artery and the right greater  saphenous vein harvested endoscopically  . Endovein harvest of greater saphenous vein  01/20/2012    Procedure: ENDOVEIN HARVEST OF GREATER SAPHENOUS VEIN;  Surgeon: Laura Pollack, MD;  Location: [facility];  Service: Open Heart Surgery;  Laterality: Right;    Family History  Problem Relation Age of Onset  . Coronary artery disease Other     family history  . Asthma Other     family history-grandfather  . Colon cancer Other     family hx  . Diabetes Maternal Grandmother     unspecif if Maternal or Paternal  . Heart failure Mother   . Cirrhosis Father    .   Breast cancer                                   Mother  2 brothers  Current outpatient prescriptions:aspirin 325 MG EC tablet, Take 325 mg by mouth every morning., Disp: , Rfl: ;  atenolol (TENORMIN) 50 MG tablet, Take 50 mg by mouth at bedtime., Disp: , Rfl: ;  FLUoxetine (PROZAC) 20 MG capsule, Take 20 mg by mouth 3 (three) times daily. , Disp: , Rfl: ;  furosemide (LASIX) 20 MG tablet, Take 20 mg by mouth daily as needed., Disp: , Rfl:  insulin glargine (LANTUS) 100 UNIT/ML injection, Inject 70 Units into the skin at bedtime. , Disp: , Rfl: ;  Insulin Lispro, Human, (HUMALOG PEN [place]), Inject 4 Units into the skin 2 (two) times daily. , Disp: , Rfl: ;  losartan (COZAAR) 100 MG tablet, Take 100 mg by mouth daily., Disp: , Rfl: ;  metFORMIN (GLUCOPHAGE) 500 MG tablet, Take 1,000 mg by mouth 2 (two) times daily., Disp: , Rfl:  Multiple Vitamin (MULTIVITAMIN WITH MINERALS) TABS, Take 1 tablet by mouth every morning. , Disp: , Rfl: ;  rosuvastatin (CRESTOR) 40 MG tablet, Take 20 mg by mouth daily., Disp: , Rfl: ;  ondansetron (ZOFRAN ODT) 8 MG disintegrating tablet, Take 1 tablet (8 mg total) by mouth every 8 (eight) hours as needed  for nausea., Disp: 12 tablet, Rfl: 0 promethazine (PHENERGAN) 12.5 MG tablet, Take 12.5-25 mg by mouth daily as needed. For nausea and vomiting, Disp: , Rfl: ;  traMADol (ULTRAM) 50 MG tablet, Take 1 tablet (50 mg  total) by mouth every 6 (six) hours as needed for pain., Disp: 40 tablet, Rfl: 0 Current facility-administered medications:sodium chloride 0.9 % injection 10 mL, 10 mL, Intravenous, PRN, Laura Pier, MD Facility-Administered Medications Ordered in Other Visits: heparin lock flush 100 unit/mL, 500 Units, Intravenous, Once, Laura Pier, MD;  sodium chloride 0.9 % injection 10 mL, 10 mL, Intravenous, PRN, Laura Pier, MD, 10 mL at 03/05/13 1539  Allergies:  Allergies  Allergen Reactions  . Latex Itching    Only where touched on body with it.  . Morphine Shortness Of Breath, Itching and Other (See Comments)    Makes patient feel as if her "insides" are on fire.  . Adhesive [Tape] Other (See Comments)    Patient states that she breaks out in blisters  . Codeine Itching and Rash    All over body.  . Penicillins Itching and Rash    Localized.    Social History: She lives alone in [place]. She worked in a [employer]. She does not use tobacco or alcohol. She received a transfusion with one of the colon surgeries.   ROS:   Positives include: "Flu "prior to Christmas 2014, occasional nausea, occasional bleeding at the naval-site of hernia surgery, intermittent headaches, hand tremors  A complete ROS was otherwise negative.  Physical Exam:  Blood pressure 171/67, pulse 57, temperature 97 F (36.1 C), temperature source Oral, resp. rate 18, height 5' 5.5" (1.664 m), weight 244 lb 4.8 oz (110.814 kg).  HEENT: Oropharynx without visible mass, neck without mass Lungs: Clear bilaterally Cardiac: Regular rate and rhythm Abdomen: Ventral hernia, no hepatosplenomegaly, no may  Vascular: Trace edema at the left lower leg (chronic per patient) Lymph nodes: No cervical, supraclavicular, axillary, or inguinal nodes Neurologic: Alert and oriented, mild tremor of the left greater than right hand, the motor exam appears intact in the upper and lower extremities Skin: No rash, Port-A-Cath  site at the left upper chest without evidence of infection Musculoskeletal: No spine tenderness   LAB: CEA pending  Radiology: As per history of present illness    Assessment/Plan:   1. 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 Indeterminate left liver lesion noted on a surveillance CT 04/16/2012, stable on an MRI 07/21/2012  4. History of coronary artery disease  5. Diabetes  6. Status post removal of an acoustic neuroma  7. Hypertension   Disposition:   [name] has a history of metachronous colon cancers. The most recent stage III cancer was diagnosed in 2011. She remains in clinical remission. We will schedule a final surveillance CT to followup on the liver lesion noted on a CT last year. If the CT is negative we will not schedule additional surveillance scans.  We will followup on the CEA from today and she will return for an office visit/CEA in 6 months. [name] will schedule a surveillance colonoscopy with Dr. Deatra Ina.  [place], [place] 03/05/2013, 6:04 PM

## 2013-03-05 NOTE — Progress Notes (Signed)
Met with [name].  She is here to establish care with Dr. Benay Spice.  She does not have a new cancer diagnosis.  Explained role of nurse navigator.  [facility] resources provided to patient, including SW service information.  Contact names and phone numbers were provided for entire [facility] team.  Teach back method was used.  No barriers to care identified.  Patient understands she is due for her colonoscopy with Dr. Deatra Ina and stated that she will call his office to schedule the procedure.

## 2013-03-05 NOTE — Patient Instructions (Signed)
Implanted Port Instructions  An implanted port is a central line that has a round shape and is placed under the skin. It is used for long-term IV (intravenous) access for:  · Medicine.  · Fluids.  · Liquid nutrition, such as TPN (total parenteral nutrition).  · Blood samples.  Ports can be placed:  · In the chest area just below the collarbone (this is the most common place.)  · In the arms.  · In the belly (abdomen) area.  · In the legs.  PARTS OF THE PORT  A port has 2 main parts:  · The reservoir. The reservoir is round, disc-shaped, and will be a small, raised area under your skin.  · The reservoir is the part where a needle is inserted (accessed) to either give medicines or to draw blood.  · The catheter. The catheter is a long, slender tube that extends from the reservoir. The catheter is placed into a large vein.  · Medicine that is inserted into the reservoir goes into the catheter and then into the vein.  INSERTION OF THE PORT  · The port is surgically placed in either an operating room or in a procedural area (interventional radiology).  · Medicine may be given to help you relax during the procedure.  · The skin where the port will be inserted is numbed (local anesthetic).  · 1 or 2 small cuts (incisions) will be made in the skin to insert the port.  · The port can be used after it has been inserted.  INCISION SITE CARE  · The incision site may have small adhesive strips on it. This helps keep the incision site closed. Sometimes, no adhesive strips are placed. Instead of adhesive strips, a special kind of surgical glue is used to keep the incision closed.  · If adhesive strips were placed on the incision sites, do not take them off. They will fall off on their own.  · The incision site may be sore for 1 to 2 days. Pain medicine can help.  · Do not get the incision site wet. Bathe or shower as directed by your caregiver.  · The incision site should heal in 5 to 7 days. A small scar may form after the  incision has healed.  ACCESSING THE PORT  Special steps must be taken to access the port:  · Before the port is accessed, a numbing cream can be placed on the skin. This helps numb the skin over the port site.  · A sterile technique is used to access the port.  · The port is accessed with a needle. Only "non-coring" port needles should be used to access the port. Once the port is accessed, a blood return should be checked. This helps ensure the port is in the vein and is not clogged (clotted).  · If your caregiver believes your port should remain accessed, a clear (transparent) bandage will be placed over the needle site. The bandage and needle will need to be changed every week or as directed by your caregiver.  · Keep the bandage covering the needle clean and dry. Do not get it wet. Follow your caregiver's instructions on how to take a shower or bath when the port is accessed.  · If your port does not need to stay accessed, no bandage is needed over the port.  FLUSHING THE PORT  Flushing the port keeps it from getting clogged. How often the port is flushed depends on:  · If a   constant infusion is running. If a constant infusion is running, the port may not need to be flushed.  · If intermittent medicines are given.  · If the port is not being used.  For intermittent medicines:  · The port will need to be flushed:  · After medicines have been given.  · After blood has been drawn.  · As part of routine maintenance.  · A port is normally flushed with:  · Normal saline.  · Heparin.  · Follow your caregiver's advice on how often, how much, and the type of flush to use on your port.  IMPORTANT PORT INFORMATION  · Tell your caregiver if you are allergic to heparin.  · After your port is placed, you will get a manufacturer's information card. The card has information about your port. Keep this card with you at all times.  · There are many types of ports available. Know what kind of port you have.  · In case of an  emergency, it may be helpful to wear a medical alert bracelet. This can help alert health care workers that you have a port.  · The port can stay in for as long as your caregiver believes it is necessary.  · When it is time for the port to come out, surgery will be done to remove it. The surgery will be similar to how the port was put in.  · If you are in the hospital or clinic:  · Your port will be taken care of and flushed by a nurse.  · If you are at home:  · A home health care nurse may give medicines and take care of the port.  · You or a family member can get special training and directions for giving medicine and taking care of the port at home.  SEEK IMMEDIATE MEDICAL CARE IF:   · Your port does not flush or you are unable to get a blood return.  · New drainage or pus is coming from the incision.  · A bad smell is coming from the incision site.  · You develop swelling or increased redness at the incision site.  · You develop increased swelling or pain at the port site.  · You develop swelling or pain in the surrounding skin near the port.  · You have an oral temperature above 102° F (38.9° C), not controlled by medicine.  MAKE SURE YOU:   · Understand these instructions.  · Will watch your condition.  · Will get help right away if you are not doing well or get worse.  Document Released: 02/11/2005 Document Revised: 05/06/2011 Document Reviewed: 05/05/2008  [employer]® Patient Information ©2014 [employer].

## 2013-03-06 LAB — CEA: CEA: 1.4 ng/mL (ref 0.0–5.0)

## 2013-03-08 ENCOUNTER — Telehealth: Payer: Self-pay | Admitting: Oncology

## 2013-03-08 NOTE — Telephone Encounter (Signed)
s/w pt re next appt for 7/9 lb/flush/BS @ 10:15am. pt also given appt for ct @ [facility] 03/11/13 @ 3pm. pt given instruction and will get prep from [facility] before test. per 1/9 pof pt to have flush appt q6w @ [facility]. s/w Mazie and per [facility] she will call pt w/appts. next due 2/20 until pt sees BS in July. pt aware

## 2013-03-09 ENCOUNTER — Telehealth: Payer: Self-pay | Admitting: *Deleted

## 2013-03-09 NOTE — Telephone Encounter (Signed)
Message copied by Brien Few on Tue Mar 09, 2013  3:58 PM ------      Message from: Ladell Pier      Created: Sat Mar 06, 2013  9:52 AM       Please call patient, cea is normal ------

## 2013-03-10 ENCOUNTER — Telehealth: Payer: Self-pay | Admitting: *Deleted

## 2013-03-10 NOTE — Telephone Encounter (Signed)
Attempted to reach patient with normal lab results. Unsuccessful

## 2013-03-11 ENCOUNTER — Ambulatory Visit (HOSPITAL_COMMUNITY): Payer: Medicare Other

## 2013-03-11 ENCOUNTER — Ambulatory Visit (HOSPITAL_COMMUNITY)
Admission: RE | Admit: 2013-03-11 | Discharge: 2013-03-11 | Disposition: A | Discharge status: Home / Self Care | Payer: Medicare Other | Source: Ambulatory Visit | Attending: Oncology | Admitting: Oncology

## 2013-03-11 DIAGNOSIS — I10 Essential (primary) hypertension: Secondary | ICD-10-CM | POA: Insufficient documentation

## 2013-03-11 DIAGNOSIS — Z951 Presence of aortocoronary bypass graft: Secondary | ICD-10-CM | POA: Insufficient documentation

## 2013-03-11 DIAGNOSIS — C182 Malignant neoplasm of ascending colon: Secondary | ICD-10-CM

## 2013-03-11 DIAGNOSIS — R933 Abnormal findings on diagnostic imaging of other parts of digestive tract: Secondary | ICD-10-CM | POA: Insufficient documentation

## 2013-03-11 DIAGNOSIS — E119 Type 2 diabetes mellitus without complications: Secondary | ICD-10-CM | POA: Insufficient documentation

## 2013-03-11 MED ORDER — HEPARIN SOD (PORK) LOCK FLUSH 100 UNIT/ML IV SOLN
INTRAVENOUS | Status: AC
  Filled 2013-03-11: qty 5

## 2013-03-11 MED ORDER — IOHEXOL 300 MG/ML  SOLN
100.0000 mL | Freq: Once | INTRAMUSCULAR | Status: AC | PRN
  Administered 2013-03-11: 100 mL via INTRAVENOUS

## 2013-03-11 MED ORDER — HEPARIN SOD (PORK) LOCK FLUSH 100 UNIT/ML IV SOLN
500.0000 [IU] | Freq: Once | INTRAVENOUS | Status: AC
  Administered 2013-03-11: 500 [IU] via INTRAVENOUS

## 2013-03-12 ENCOUNTER — Ambulatory Visit (HOSPITAL_COMMUNITY): Admission: RE | Admit: 2013-03-12 | Payer: Medicare Other | Source: Ambulatory Visit

## 2013-03-12 NOTE — Telephone Encounter (Signed)
Message copied by Brien Few on Fri Mar 12, 2013  2:30 PM ------      Message from: Betsy Coder B      Created: Thu Mar 11, 2013  8:58 PM       Please call patient, CTs are negative, f/u as scheduled ------

## 2013-03-12 NOTE — Telephone Encounter (Signed)
Unable to reach pt at either number listed. Left message on voicemail for friend, Ailene Ravel to have pt contact office for results.

## 2013-03-13 ENCOUNTER — Telehealth: Payer: Self-pay | Admitting: *Deleted

## 2013-03-13 NOTE — Telephone Encounter (Signed)
Message copied by Tania Ade on Sat Mar 13, 2013 12:51 PM ------      Message from: Betsy Coder B      Created: Thu Mar 11, 2013  8:58 PM       Please call patient, CTs are negative, f/u as scheduled ------

## 2013-03-13 NOTE — Telephone Encounter (Signed)
NO answer/unable to leave VM

## 2013-03-15 ENCOUNTER — Ambulatory Visit (HOSPITAL_COMMUNITY)
Admission: RE | Admit: 2013-03-15 | Discharge: 2013-03-15 | Disposition: A | Discharge status: Home / Self Care | Payer: Medicare Other | Source: Ambulatory Visit | Attending: Neurology | Admitting: Neurology

## 2013-03-15 DIAGNOSIS — G319 Degenerative disease of nervous system, unspecified: Secondary | ICD-10-CM | POA: Insufficient documentation

## 2013-03-15 DIAGNOSIS — G3184 Mild cognitive impairment, so stated: Secondary | ICD-10-CM | POA: Insufficient documentation

## 2013-03-15 DIAGNOSIS — I6789 Other cerebrovascular disease: Secondary | ICD-10-CM | POA: Insufficient documentation

## 2013-03-15 MED ORDER — GADOBENATE DIMEGLUMINE 529 MG/ML IV SOLN
20.0000 mL | Freq: Once | INTRAVENOUS | Status: AC | PRN
  Administered 2013-03-15: 20 mL via INTRAVENOUS

## 2013-03-18 ENCOUNTER — Other Ambulatory Visit (HOSPITAL_COMMUNITY): Payer: Self-pay | Admitting: Orthopaedic Surgery

## 2013-03-18 DIAGNOSIS — T8459XA Infection and inflammatory reaction due to other internal joint prosthesis, initial encounter: Secondary | ICD-10-CM

## 2013-03-18 DIAGNOSIS — Z96659 Presence of unspecified artificial knee joint: Principal | ICD-10-CM

## 2013-03-18 DIAGNOSIS — T84038A Mechanical loosening of other internal prosthetic joint, initial encounter: Secondary | ICD-10-CM

## 2013-03-19 ENCOUNTER — Encounter (HOSPITAL_COMMUNITY): Payer: Medicare Other

## 2013-03-25 ENCOUNTER — Emergency Department (HOSPITAL_COMMUNITY)
Admission: EM | Admit: 2013-03-25 | Discharge: 2013-03-25 | Discharge status: Against Medical Advice | Payer: Medicare Other | Attending: Emergency Medicine | Admitting: Emergency Medicine

## 2013-03-25 ENCOUNTER — Encounter (HOSPITAL_COMMUNITY): Payer: Self-pay | Admitting: Emergency Medicine

## 2013-03-25 DIAGNOSIS — J45909 Unspecified asthma, uncomplicated: Secondary | ICD-10-CM | POA: Insufficient documentation

## 2013-03-25 DIAGNOSIS — I10 Essential (primary) hypertension: Secondary | ICD-10-CM | POA: Insufficient documentation

## 2013-03-25 DIAGNOSIS — E119 Type 2 diabetes mellitus without complications: Secondary | ICD-10-CM | POA: Insufficient documentation

## 2013-03-25 HISTORY — DX: Parkinson's disease without dyskinesia, without mention of fluctuations: G20.A1

## 2013-03-25 HISTORY — DX: Parkinson's disease: G20

## 2013-03-25 NOTE — ED Notes (Signed)
No answer for room assignment

## 2013-03-25 NOTE — ED Notes (Signed)
Called for pt 3rd time, pt not in waiting room.

## 2013-03-25 NOTE — ED Notes (Signed)
Patient has large abscess with cellulitis to mid upper abd. Per patient draining white drainage and blood at navel. Abd red and warm to touch. Denies any nausea vomiting, diarrhea, or fevers. Reports fatigue.

## 2013-03-26 ENCOUNTER — Encounter (HOSPITAL_COMMUNITY): Payer: Self-pay

## 2013-03-26 ENCOUNTER — Other Ambulatory Visit: Payer: Self-pay | Admitting: Internal Medicine

## 2013-03-26 ENCOUNTER — Inpatient Hospital Stay (HOSPITAL_COMMUNITY)
Admission: AD | Admit: 2013-03-26 | Discharge: 2013-03-31 | DRG: 603 | Disposition: A | Discharge status: Home Health | Payer: Medicare Other | Source: Ambulatory Visit | Attending: Internal Medicine | Admitting: Internal Medicine

## 2013-03-26 DIAGNOSIS — E785 Hyperlipidemia, unspecified: Secondary | ICD-10-CM | POA: Diagnosis present

## 2013-03-26 DIAGNOSIS — Z951 Presence of aortocoronary bypass graft: Secondary | ICD-10-CM

## 2013-03-26 DIAGNOSIS — IMO0001 Reserved for inherently not codable concepts without codable children: Secondary | ICD-10-CM | POA: Diagnosis present

## 2013-03-26 DIAGNOSIS — Z800 Family history of malignant neoplasm of digestive organs: Secondary | ICD-10-CM

## 2013-03-26 DIAGNOSIS — G20 Parkinson's disease: Secondary | ICD-10-CM | POA: Diagnosis present

## 2013-03-26 DIAGNOSIS — K3184 Gastroparesis: Secondary | ICD-10-CM | POA: Diagnosis present

## 2013-03-26 DIAGNOSIS — Z9861 Coronary angioplasty status: Secondary | ICD-10-CM

## 2013-03-26 DIAGNOSIS — Z7982 Long term (current) use of aspirin: Secondary | ICD-10-CM

## 2013-03-26 DIAGNOSIS — G20A1 Parkinson's disease without dyskinesia, without mention of fluctuations: Secondary | ICD-10-CM | POA: Diagnosis present

## 2013-03-26 DIAGNOSIS — E1165 Type 2 diabetes mellitus with hyperglycemia: Secondary | ICD-10-CM

## 2013-03-26 DIAGNOSIS — B951 Streptococcus, group B, as the cause of diseases classified elsewhere: Secondary | ICD-10-CM | POA: Diagnosis present

## 2013-03-26 DIAGNOSIS — Z794 Long term (current) use of insulin: Secondary | ICD-10-CM

## 2013-03-26 DIAGNOSIS — M1990 Unspecified osteoarthritis, unspecified site: Secondary | ICD-10-CM | POA: Diagnosis present

## 2013-03-26 DIAGNOSIS — K439 Ventral hernia without obstruction or gangrene: Secondary | ICD-10-CM | POA: Diagnosis present

## 2013-03-26 DIAGNOSIS — Z880 Allergy status to penicillin: Secondary | ICD-10-CM

## 2013-03-26 DIAGNOSIS — Z91040 Latex allergy status: Secondary | ICD-10-CM

## 2013-03-26 DIAGNOSIS — Z85038 Personal history of other malignant neoplasm of large intestine: Secondary | ICD-10-CM

## 2013-03-26 DIAGNOSIS — Z885 Allergy status to narcotic agent status: Secondary | ICD-10-CM

## 2013-03-26 DIAGNOSIS — K5900 Constipation, unspecified: Secondary | ICD-10-CM | POA: Diagnosis present

## 2013-03-26 DIAGNOSIS — I10 Essential (primary) hypertension: Secondary | ICD-10-CM | POA: Diagnosis present

## 2013-03-26 DIAGNOSIS — Z8249 Family history of ischemic heart disease and other diseases of the circulatory system: Secondary | ICD-10-CM

## 2013-03-26 DIAGNOSIS — E871 Hypo-osmolality and hyponatremia: Secondary | ICD-10-CM | POA: Diagnosis present

## 2013-03-26 DIAGNOSIS — F411 Generalized anxiety disorder: Secondary | ICD-10-CM | POA: Diagnosis present

## 2013-03-26 DIAGNOSIS — Z833 Family history of diabetes mellitus: Secondary | ICD-10-CM

## 2013-03-26 DIAGNOSIS — K219 Gastro-esophageal reflux disease without esophagitis: Secondary | ICD-10-CM | POA: Diagnosis present

## 2013-03-26 DIAGNOSIS — Z825 Family history of asthma and other chronic lower respiratory diseases: Secondary | ICD-10-CM

## 2013-03-26 DIAGNOSIS — I2510 Atherosclerotic heart disease of native coronary artery without angina pectoris: Secondary | ICD-10-CM | POA: Diagnosis present

## 2013-03-26 DIAGNOSIS — L02219 Cutaneous abscess of trunk, unspecified: Principal | ICD-10-CM | POA: Diagnosis present

## 2013-03-26 DIAGNOSIS — F329 Major depressive disorder, single episode, unspecified: Secondary | ICD-10-CM | POA: Diagnosis present

## 2013-03-26 DIAGNOSIS — F3289 Other specified depressive episodes: Secondary | ICD-10-CM | POA: Diagnosis present

## 2013-03-26 DIAGNOSIS — Z9049 Acquired absence of other specified parts of digestive tract: Secondary | ICD-10-CM

## 2013-03-26 DIAGNOSIS — E8809 Other disorders of plasma-protein metabolism, not elsewhere classified: Secondary | ICD-10-CM | POA: Diagnosis present

## 2013-03-26 DIAGNOSIS — L03319 Cellulitis of trunk, unspecified: Principal | ICD-10-CM

## 2013-03-26 DIAGNOSIS — G4730 Sleep apnea, unspecified: Secondary | ICD-10-CM | POA: Diagnosis present

## 2013-03-26 DIAGNOSIS — L0390 Cellulitis, unspecified: Secondary | ICD-10-CM | POA: Diagnosis present

## 2013-03-26 LAB — COMPREHENSIVE METABOLIC PANEL
ALK PHOS: 129 U/L — AB (ref 39–117)
ALT: 15 U/L (ref 0–35)
AST: 13 U/L (ref 0–37)
Albumin: 2.5 g/dL — ABNORMAL LOW (ref 3.5–5.2)
BILIRUBIN TOTAL: 0.4 mg/dL (ref 0.3–1.2)
BUN: 20 mg/dL (ref 6–23)
CHLORIDE: 88 meq/L — AB (ref 96–112)
CO2: 29 meq/L (ref 19–32)
Calcium: 10.5 mg/dL (ref 8.4–10.5)
Creatinine, Ser: 0.97 mg/dL (ref 0.50–1.10)
GFR, EST AFRICAN AMERICAN: 67 mL/min — AB (ref >90)
GFR, EST NON AFRICAN AMERICAN: 58 mL/min — AB (ref >90)
GLUCOSE: 474 mg/dL — AB (ref 70–99)
POTASSIUM: 4.5 meq/L (ref 3.7–5.3)
Sodium: 128 mEq/L — ABNORMAL LOW (ref 137–147)
TOTAL PROTEIN: 7.3 g/dL (ref 6.0–8.3)

## 2013-03-26 LAB — CBC WITH DIFFERENTIAL/PLATELET
Basophils Absolute: 0 10*3/uL (ref 0.0–0.1)
Basophils Relative: 0 % (ref 0–1)
EOS ABS: 0 10*3/uL (ref 0.0–0.7)
Eosinophils Relative: 0 % (ref 0–5)
HCT: 35.6 % — ABNORMAL LOW (ref 36.0–46.0)
Hemoglobin: 12.3 g/dL (ref 12.0–15.0)
LYMPHS ABS: 1.3 10*3/uL (ref 0.7–4.0)
LYMPHS PCT: 11 % — AB (ref 12–46)
MCH: 30.1 pg (ref 26.0–34.0)
MCHC: 34.6 g/dL (ref 30.0–36.0)
MCV: 87 fL (ref 78.0–100.0)
MONOS PCT: 6 % (ref 3–12)
Monocytes Absolute: 0.7 10*3/uL (ref 0.1–1.0)
NEUTROS PCT: 82 % — AB (ref 43–77)
Neutro Abs: 8.9 10*3/uL — ABNORMAL HIGH (ref 1.7–7.7)
Platelets: 379 10*3/uL (ref 150–400)
RBC: 4.09 MIL/uL (ref 3.87–5.11)
RDW: 13.2 % (ref 11.5–15.5)
WBC: 10.9 10*3/uL — AB (ref 4.0–10.5)

## 2013-03-26 LAB — GLUCOSE, CAPILLARY
GLUCOSE-CAPILLARY: 390 mg/dL — AB (ref 70–99)
Glucose-Capillary: 402 mg/dL — ABNORMAL HIGH (ref 70–99)

## 2013-03-26 MED ORDER — VANCOMYCIN HCL IN DEXTROSE 1-5 GM/200ML-% IV SOLN
1000.0000 mg | Freq: Two times a day (BID) | INTRAVENOUS | Status: DC
  Administered 2013-03-26 – 2013-03-30 (×8): 1000 mg via INTRAVENOUS
  Filled 2013-03-26 (×8): qty 200

## 2013-03-26 MED ORDER — ADULT MULTIVITAMIN W/MINERALS CH
1.0000 | ORAL_TABLET | Freq: Every morning | ORAL | Status: DC
  Administered 2013-03-27 – 2013-03-31 (×5): 1 via ORAL
  Filled 2013-03-26 (×5): qty 1

## 2013-03-26 MED ORDER — INSULIN ASPART 100 UNIT/ML ~~LOC~~ SOLN
0.0000 [IU] | Freq: Every day | SUBCUTANEOUS | Status: DC
Start: 2013-03-26 — End: 2013-03-28
  Administered 2013-03-27 (×2): 5 [IU] via SUBCUTANEOUS

## 2013-03-26 MED ORDER — DEXTROSE 5 % IV SOLN
1.0000 g | Freq: Three times a day (TID) | INTRAVENOUS | Status: DC
  Administered 2013-03-27 – 2013-03-30 (×11): 1 g via INTRAVENOUS
  Filled 2013-03-26 (×12): qty 1

## 2013-03-26 MED ORDER — INSULIN GLARGINE 100 UNIT/ML ~~LOC~~ SOLN
25.0000 [IU] | Freq: Two times a day (BID) | SUBCUTANEOUS | Status: DC
  Administered 2013-03-27 (×2): 25 [IU] via SUBCUTANEOUS
  Filled 2013-03-26 (×6): qty 0.25

## 2013-03-26 MED ORDER — INSULIN ASPART 100 UNIT/ML ~~LOC~~ SOLN
0.0000 [IU] | Freq: Three times a day (TID) | SUBCUTANEOUS | Status: DC
Start: 2013-03-27 — End: 2013-03-28
  Administered 2013-03-26: 15 [IU] via SUBCUTANEOUS
  Administered 2013-03-27 (×2): 5 [IU] via SUBCUTANEOUS
  Administered 2013-03-27: 8 [IU] via SUBCUTANEOUS
  Administered 2013-03-28: 11 [IU] via SUBCUTANEOUS

## 2013-03-26 MED ORDER — ENOXAPARIN SODIUM 40 MG/0.4ML ~~LOC~~ SOLN
40.0000 mg | SUBCUTANEOUS | Status: DC
  Administered 2013-03-26 – 2013-03-28 (×3): 40 mg via SUBCUTANEOUS
  Filled 2013-03-26 (×3): qty 0.4

## 2013-03-26 MED ORDER — SODIUM CHLORIDE 0.9 % IV SOLN
250.0000 mL | INTRAVENOUS | Status: DC | PRN
  Administered 2013-03-29: 250 mL via INTRAVENOUS

## 2013-03-26 MED ORDER — ACETAMINOPHEN 650 MG RE SUPP: 650.0000 mg | Freq: Four times a day (QID) | RECTAL | Status: DC | PRN

## 2013-03-26 MED ORDER — ALUM & MAG HYDROXIDE-SIMETH 200-200-20 MG/5ML PO SUSP: 30.0000 mL | Freq: Four times a day (QID) | ORAL | Status: DC | PRN

## 2013-03-26 MED ORDER — SODIUM CHLORIDE 0.9 % IJ SOLN
3.0000 mL | Freq: Two times a day (BID) | INTRAMUSCULAR | Status: DC
  Administered 2013-03-27 – 2013-03-28 (×3): 3 mL via INTRAVENOUS

## 2013-03-26 MED ORDER — ACETAMINOPHEN 325 MG PO TABS: 650.0000 mg | ORAL_TABLET | Freq: Four times a day (QID) | ORAL | Status: DC | PRN

## 2013-03-26 MED ORDER — ONDANSETRON HCL 4 MG PO TABS: 4.0000 mg | ORAL_TABLET | Freq: Four times a day (QID) | ORAL | Status: DC | PRN

## 2013-03-26 MED ORDER — LOSARTAN POTASSIUM 50 MG PO TABS
100.0000 mg | ORAL_TABLET | Freq: Every day | ORAL | Status: DC
  Administered 2013-03-27 – 2013-03-31 (×5): 100 mg via ORAL
  Filled 2013-03-26 (×5): qty 2

## 2013-03-26 MED ORDER — SODIUM CHLORIDE 0.9 % IJ SOLN: 3.0000 mL | INTRAMUSCULAR | Status: DC | PRN

## 2013-03-26 MED ORDER — ATENOLOL 25 MG PO TABS
50.0000 mg | ORAL_TABLET | Freq: Every day | ORAL | Status: DC
  Administered 2013-03-27 – 2013-03-30 (×5): 50 mg via ORAL
  Filled 2013-03-26 (×5): qty 2

## 2013-03-26 MED ORDER — ONDANSETRON HCL 4 MG/2ML IJ SOLN
4.0000 mg | Freq: Four times a day (QID) | INTRAMUSCULAR | Status: DC | PRN
  Filled 2013-03-26: qty 2

## 2013-03-26 MED ORDER — FLUOXETINE HCL 20 MG PO CAPS
20.0000 mg | ORAL_CAPSULE | Freq: Three times a day (TID) | ORAL | Status: DC
  Administered 2013-03-27 – 2013-03-31 (×13): 20 mg via ORAL
  Filled 2013-03-26 (×13): qty 1

## 2013-03-26 NOTE — Consult Note (Signed)
Reason for Consult: Cellulitis, abdominal wall Referring Physician: Dr. Ronney Lion [name] is an 68 y.o. female.  HPI: Patient is a 68 year old white female with multiple medical problems which include morbid obesity, diabetes mellitus, and multiple abdominal surgeries who was admitted by Dr. Willey Blade for further management treatment of cellulitis of the abdominal wall. Patient states that she has had multiple surgeries in [facility] for both colon cancer and to repair her ventral hernias. Her last surgery was attempted by Dr. Johney Maine. She states that he told her he could not fix the hernias. She had an outpatient CT scan on 03/11/2013. She states that this tenderness in thickness of her abdominal wall was present at that time. She she became more concerned about it when it became more irritated and had drainage.  Past Medical History  Diagnosis Date  . Coronary artery disease     a. s/p stent to lad 2006 w subsequent kissing balloon pci to diagonal;  b.  pci of the lad w a drug-eluting stent July 2010;  c.  LHC 11/13/11: p-mLAD stent with prox 50% ISR, 80% beyond dist edge of stent, m+dLAD beyond stent 40-50%, mod caliber pD1 80%, mRCA 70%, m-dRCA 20-30%, EF 55-65% => med Rx (consider CABG)  . Diabetes mellitus   . Hypertension   . Dyslipidemia   . Morbid obesity   . GERD (gastroesophageal reflux disease)   . Varicose vein   . Gastroparesis   . Osteoarthritis   . Blurred vision   . Acoustic neuroma     left ear  . Anxiety   . Depressed   . Asthma     AS CHILD   . Sleep apnea     12+ YRS NO MACHINE GOTTEN   . Anginal pain     DR Theola Sequin    . Headache(784.0)   . Fibromyalgia   . Hiatal hernia   . Adenocarcinoma     ascending colon, LNs pos -- S/P right hemicolectomy, xeloda completed 6/10 (dr. Sonny Dandy)  . Morbid obesity   . Parkinson disease     Past Surgical History  Procedure Laterality Date  . Cataract extraction    . Fetal surgery for congenital hernia    . Knee  arthroscopy    . Replacement total knee    . Tonsillectomy    . Hemicolectomy  12/28/07    right for adenocarcinoma of ascending colon  . Hysterotomy  1993  . Herniaorraphy  01/22/06    for recurrent ventral hernia  . Laparotomy      for small bowl obstruction  . Resecton of acoustic neuroma    . Stent surgery    . Abdominal hysterectomy  1993  . Cholecystectomy  1966  . Hernia repair  1990, 2002, 2007    open x1, lap x2   . Colon surgery    . Portacath placement    . Excision morton's neuroma    . Appendectomy    . Cardiac catheterization    . Lesion removal  10/08/2011    Procedure: LESION REMOVAL;  Surgeon: Sanjuana Kava, MD;  Location: [facility];  Service: Orthopedics;  Laterality: Right;  Removal Lesion Right Long Finger  . Back surgery    . Eye surgery      cataract on right 2005, left 2008  . Joint replacement  2005 and 2006    right 2005, left 2006  . Finger replantation      RECENTLY   . Mandible surgery    .  Coronary artery bypass graft  01/20/2012    Procedure: CORONARY ARTERY BYPASS GRAFTING (CABG);  Surgeon: Gaye Pollack, MD;  Location: [place];  Service: Open Heart Surgery;  Laterality: N/A;  Coronary artery bypass graft times three utilizing the left internal mammary artery and the right greater saphenous vein harvested endoscopically  . Endovein harvest of greater saphenous vein  01/20/2012    Procedure: ENDOVEIN HARVEST OF GREATER SAPHENOUS VEIN;  Surgeon: Gaye Pollack, MD;  Location: [place];  Service: Open Heart Surgery;  Laterality: Right;    Family History  Problem Relation Age of Onset  . Coronary artery disease Other     family history  . Asthma Other     family history-grandfather  . Colon cancer Other     family hx  . Diabetes Maternal Grandmother     unspecif if Maternal or Paternal  . Heart failure Mother   . Cirrhosis Father     Social History:  reports that she has never smoked. She has never used smokeless tobacco. She reports that she does  not drink alcohol or use illicit drugs.  Allergies:  Allergies  Allergen Reactions  . Latex Itching    Only where touched on body with it.  . Morphine Shortness Of Breath, Itching and Other (See Comments)    Makes patient feel as if her "insides" are on fire.  . Adhesive [Tape] Other (See Comments)    Patient states that she breaks out in blisters  . Codeine Itching and Rash    All over body.  . Penicillins Itching and Rash    Localized.    Medications: I have reviewed the patient's current medications.  Results for orders placed during the hospital encounter of 03/26/13 (from the past 48 hour(s))  GLUCOSE, CAPILLARY     Status: Abnormal   Collection Time    03/26/13  5:26 PM      Result Value Range   Glucose-Capillary 390 (*) 70 - 99 mg/dL   Comment 1 Notify RN     Comment 2 Documented in Chart    CBC WITH DIFFERENTIAL     Status: Abnormal   Collection Time    03/26/13  7:14 PM      Result Value Range   WBC 10.9 (*) 4.0 - 10.5 K/uL   RBC 4.09  3.87 - 5.11 MIL/uL   Hemoglobin 12.3  12.0 - 15.0 g/dL   HCT 35.6 (*) 36.0 - 46.0 %   MCV 87.0  78.0 - 100.0 fL   MCH 30.1  26.0 - 34.0 pg   MCHC 34.6  30.0 - 36.0 g/dL   RDW 13.2  11.5 - 15.5 %   Platelets 379  150 - 400 K/uL   Neutrophils Relative % 82 (*) 43 - 77 %   Neutro Abs 8.9 (*) 1.7 - 7.7 K/uL   Lymphocytes Relative 11 (*) 12 - 46 %   Lymphs Abs 1.3  0.7 - 4.0 K/uL   Monocytes Relative 6  3 - 12 %   Monocytes Absolute 0.7  0.1 - 1.0 K/uL   Eosinophils Relative 0  0 - 5 %   Eosinophils Absolute 0.0  0.0 - 0.7 K/uL   Basophils Relative 0  0 - 1 %   Basophils Absolute 0.0  0.0 - 0.1 K/uL  COMPREHENSIVE METABOLIC PANEL     Status: Abnormal   Collection Time    03/26/13  7:14 PM      Result Value Range  Sodium 128 (*) 137 - 147 mEq/L   Potassium 4.5  3.7 - 5.3 mEq/L   Chloride 88 (*) 96 - 112 mEq/L   CO2 29  19 - 32 mEq/L   Glucose, Bld 474 (*) 70 - 99 mg/dL   BUN 20  6 - 23 mg/dL   Creatinine, Ser 0.97  0.50 -  1.10 mg/dL   Calcium 10.5  8.4 - 10.5 mg/dL   Total Protein 7.3  6.0 - 8.3 g/dL   Albumin 2.5 (*) 3.5 - 5.2 g/dL   AST 13  0 - 37 U/L   ALT 15  0 - 35 U/L   Alkaline Phosphatase 129 (*) 39 - 117 U/L   Total Bilirubin 0.4  0.3 - 1.2 mg/dL   GFR calc non Af Amer 58 (*) >90 mL/min   GFR calc Af Amer 67 (*) >90 mL/min   Comment: (NOTE)     The eGFR has been calculated using the CKD EPI equation.     This calculation has not been validated in all clinical situations.     eGFR's persistently <90 mL/min signify possible Chronic Kidney     Disease.  GLUCOSE, CAPILLARY     Status: Abnormal   Collection Time    03/26/13  8:29 PM      Result Value Range   Glucose-Capillary 402 (*) 70 - 99 mg/dL   Comment 1 Notify RN      No results found.  ROS: See chart Blood pressure 128/74, pulse 69, temperature 98.2 F (36.8 C), temperature source Oral, resp. rate 20, height $RemoveBe'5\' 5"'CaNvePuzn$  (1.651 m), weight 106.1 kg (233 lb 14.5 oz), SpO2 97.00%. Physical Exam: Pleasant white female in no acute distress. Her abdomen is soft with a large body habitus present. She does have a large area of induration and erythema in the midportion of the abdomen in an ovoid configuration. No fluctuance is noted. No rigidity is noted.  Assessment/Plan: Impression: Cellulitis in the abdominal wall, status post multiple abdominal surgeries. CT scan was reviewed from 03/11/2013 which reveals multiple ventral hernias with small and large bowel present through either specific hernias or fascial laxity. Patient gives a history that this has been present for some time now. She is hyperglycemic. Surgery in this patient is very risky given her comorbidities. No need for acute surgical intervention. Would continue vancomycin as ordered. Will add Azactam for gram-negative coverage. Referral to a tertiary care center like [facility] may be warranted given her abdominal wall complexity should she require further surgical intervention  concerning her abdominal wall.  Mylisa Brunson A 03/26/2013, 9:32 PM

## 2013-03-26 NOTE — Progress Notes (Signed)
ANTIBIOTIC CONSULT NOTE - INITIAL  Pharmacy Consult for Vancomycin Indication: cellulitis  Allergies  Allergen Reactions  . Latex Itching    Only where touched on body with it.  . Morphine Shortness Of Breath, Itching and Other (See Comments)    Makes patient feel as if her "insides" are on fire.  . Adhesive [Tape] Other (See Comments)    Patient states that she breaks out in blisters  . Codeine Itching and Rash    All over body.  . Penicillins Itching and Rash    Localized.    Patient Measurements: Height: 5\' 5"  (165.1 cm) Weight: 233 lb 14.5 oz (106.1 kg) IBW/kg (Calculated) : 57  Vital Signs: BP: 128/46 mmHg (01/30 1645) Pulse Rate: 79 (01/30 1645) Intake/Output from previous day:   Intake/Output from this shift:    Labs: No results found for this basename: WBC, HGB, PLT, LABCREA, CREATININE,  in the last 72 hours Estimated Creatinine Clearance: 79.1 ml/min (by C-G formula based on Cr of 0.8). No results found for this basename: VANCOTROUGH, VANCOPEAK, VANCORANDOM, GENTTROUGH, GENTPEAK, GENTRANDOM, TOBRATROUGH, TOBRAPEAK, TOBRARND, AMIKACINPEAK, AMIKACINTROU, AMIKACIN,  in the last 72 hours   Microbiology: No results found for this or any previous visit (from the past 720 hour(s)).  Medical History: Past Medical History  Diagnosis Date  . Coronary artery disease     a. s/p stent to lad 2006 w subsequent kissing balloon pci to diagonal;  b.  pci of the lad w a drug-eluting stent July 2010;  c.  LHC 11/13/11: p-mLAD stent with prox 50% ISR, 80% beyond dist edge of stent, m+dLAD beyond stent 40-50%, mod caliber pD1 80%, mRCA 70%, m-dRCA 20-30%, EF 55-65% => med Rx (consider CABG)  . Diabetes mellitus   . Hypertension   . Dyslipidemia   . Morbid obesity   . GERD (gastroesophageal reflux disease)   . Varicose vein   . Gastroparesis   . Osteoarthritis   . Blurred vision   . Acoustic neuroma     left ear  . Anxiety   . Depressed   . Asthma     AS CHILD   . Sleep  apnea     12+ YRS NO MACHINE GOTTEN   . Anginal pain     DR Theola Sequin    . Headache(784.0)   . Fibromyalgia   . Hiatal hernia   . Adenocarcinoma     ascending colon, LNs pos -- S/P right hemicolectomy, xeloda completed 6/10 (dr. Sonny Dandy)  . Morbid obesity   . Parkinson disease     Medications:  Prescriptions prior to admission  Medication Sig Dispense Refill  . aspirin 325 MG EC tablet Take 325 mg by mouth every morning.      Marland Kitchen atenolol (TENORMIN) 50 MG tablet Take 50 mg by mouth at bedtime.      Marland Kitchen FLUoxetine (PROZAC) 20 MG capsule Take 20 mg by mouth 3 (three) times daily.       . furosemide (LASIX) 20 MG tablet Take 20 mg by mouth daily as needed.      . insulin glargine (LANTUS) 100 UNIT/ML injection Inject 70 Units into the skin at bedtime.       . Insulin Lispro, Human, (HUMALOG PEN [place]) Inject 4 Units into the skin 2 (two) times daily.       Marland Kitchen losartan (COZAAR) 100 MG tablet Take 100 mg by mouth daily.      . metFORMIN (GLUCOPHAGE) 500 MG tablet Take 1,000 mg by mouth 2 (  two) times daily.      . Multiple Vitamin (MULTIVITAMIN WITH MINERALS) TABS Take 1 tablet by mouth every morning.       . ondansetron (ZOFRAN ODT) 8 MG disintegrating tablet Take 1 tablet (8 mg total) by mouth every 8 (eight) hours as needed for nausea.  12 tablet  0  . promethazine (PHENERGAN) 12.5 MG tablet Take 12.5-25 mg by mouth daily as needed. For nausea and vomiting      . rosuvastatin (CRESTOR) 40 MG tablet Take 20 mg by mouth daily.      . traMADol (ULTRAM) 50 MG tablet Take 1 tablet (50 mg total) by mouth every 6 (six) hours as needed for pain.  40 tablet  0   Assessment: 68yo obese female with abscess and cellulitis to mid upper abdomen.  Pt has good renal fxn.  Estimated Creatinine Clearance: 79.1 ml/min (by C-G formula based on Cr of 0.8).  Goal of Therapy:  Vancomycin trough level 10-15 mcg/ml  Plan:  Vancomycin 1gm IV q12hrs Check trough at steady state Monitor labs, renal fxn, and  cultures  Hart Robinsons A 03/26/2013,4:58 PM

## 2013-03-27 ENCOUNTER — Inpatient Hospital Stay (HOSPITAL_COMMUNITY): Payer: Medicare Other

## 2013-03-27 LAB — GLUCOSE, CAPILLARY
GLUCOSE-CAPILLARY: 238 mg/dL — AB (ref 70–99)
GLUCOSE-CAPILLARY: 355 mg/dL — AB (ref 70–99)
Glucose-Capillary: 244 mg/dL — ABNORMAL HIGH (ref 70–99)

## 2013-03-27 MED ORDER — ZOLPIDEM TARTRATE 5 MG PO TABS
5.0000 mg | ORAL_TABLET | Freq: Once | ORAL | Status: AC
  Administered 2013-03-27: 5 mg via ORAL
  Filled 2013-03-27: qty 1

## 2013-03-27 MED ORDER — INSULIN GLARGINE 100 UNIT/ML ~~LOC~~ SOLN
35.0000 [IU] | Freq: Two times a day (BID) | SUBCUTANEOUS | Status: DC
  Administered 2013-03-27 – 2013-03-28 (×2): 35 [IU] via SUBCUTANEOUS
  Filled 2013-03-27 (×4): qty 0.35

## 2013-03-27 MED ORDER — IOHEXOL 300 MG/ML  SOLN
100.0000 mL | Freq: Once | INTRAMUSCULAR | Status: AC | PRN
  Administered 2013-03-27: 100 mL via INTRAVENOUS

## 2013-03-27 MED ORDER — SODIUM CHLORIDE 0.9 % IJ SOLN
INTRAMUSCULAR | Status: AC
  Filled 2013-03-27: qty 500

## 2013-03-27 MED ORDER — DEXTROSE 5 % IV SOLN
INTRAVENOUS | Status: AC
  Filled 2013-03-27 (×2): qty 1

## 2013-03-27 MED ORDER — MUPIROCIN 2 % EX OINT
TOPICAL_OINTMENT | Freq: Three times a day (TID) | CUTANEOUS | Status: DC
  Administered 2013-03-27 – 2013-03-28 (×5): via TOPICAL
  Administered 2013-03-28: 1 via TOPICAL
  Administered 2013-03-29 – 2013-03-30 (×3): via TOPICAL
  Filled 2013-03-27 (×4): qty 22

## 2013-03-27 NOTE — Progress Notes (Signed)
  Subjective: Started draining from abdominal wound. Some tenderness at wound site.  Objective: Vital signs in last 24 hours: Temp:  [98.2 F (36.8 C)-98.5 F (36.9 C)] 98.5 F (36.9 C) (01/31 0643) Pulse Rate:  [69-79] 70 (01/31 0643) Resp:  [20] 20 (01/31 0643) BP: (120-128)/(46-74) 120/54 mmHg (01/31 0643) SpO2:  [93 %-98 %] 93 % (01/31 0643) Weight:  [106.1 kg (233 lb 14.5 oz)-108.4 kg (238 lb 15.7 oz)] 108.4 kg (238 lb 15.7 oz) (01/31 0643) Last BM Date: 03/27/13  Intake/Output from previous day: 01/30 0701 - 01/31 0700 In: 100 [IV Piggyback:100] Out: 1050 [Urine:1050] Intake/Output this shift:    General appearance: alert and cooperative GI: Soft. Induration and erythema improving. Purulent fluid expressed from central portion of abdominal wound. No bowel contents present.  Lab Results:   Recent Labs  03/26/13 1914  WBC 10.9*  HGB 12.3  HCT 35.6*  PLT 379   BMET  Recent Labs  03/26/13 1914  NA 128*  K 4.5  CL 88*  CO2 29  GLUCOSE 474*  BUN 20  CREATININE 0.97  CALCIUM 10.5   PT/INR No results found for this basename: LABPROT, INR,  in the last 72 hours  Studies/Results: No results found.  Anti-infectives: Anti-infectives   Start     Dose/Rate Route Frequency Ordered Stop   03/26/13 2200  aztreonam (AZACTAM) 1 g in dextrose 5 % 50 mL IVPB     1 g 100 mL/hr over 30 Minutes Intravenous 3 times per day 03/26/13 2152     03/26/13 1800  vancomycin (VANCOCIN) IVPB 1000 mg/200 mL premix     1,000 mg 200 mL/hr over 60 Minutes Intravenous Every 12 hours 03/26/13 1657        Assessment/Plan: Impression: Cellulitis with abscess, abdominal wall. It is now draining. We'll get culture. Wound care orders have been written. Continue current management. No need for acute surgical intervention at this time.  LOS: 1 day    Kallen Delatorre A 03/27/2013

## 2013-03-27 NOTE — H&P (Signed)
[name], [name]                ACCOUNT NO.:  [id number]  MEDICAL RECORD NO.:  [id number]  LOCATION:  A311                          FACILITY:  [facility]  PHYSICIAN:  Paula Compton. Willey Blade, MD       DATE OF BIRTH:  Jul 08, 1945  DATE OF ADMISSION:  03/26/2013 DATE OF DISCHARGE:  LH                             HISTORY & PHYSICAL   CHIEF COMPLAINT:  Abdominal wound.  HISTORY OF PRESENT ILLNESS:  This patient is a 68 year old, white female who presented to my office complaining of a wound on her mid abdomen. She had developed redness of the skin and then swelling in the mid abdomen over the past week.  She had been to the emergency room the day prior to presentation, but had left after a long wait without being seen.  She was found to have an extensive area of cellulitis with an abscess present.  She has underlying diabetes.  She has a history of abdominal surgery.  She has had abdominal wall hernia formation.  She has had mesh implantation in the past.  PAST MEDICAL HISTORY: 1. Coronary artery disease, status post bypass surgery in November     2013. 2. Type 2 diabetes. 3. Hypertension. 4. Hyperlipidemia. 5. Morbid obesity. 6. GERD. 7. Depression and anxiety. 8. Sleep apnea. 9. Colon carcinoma status post right hemicolectomy. 10.History of laparoscopic lysis of adhesions with intraoperative     enterotomy repair in 2012 by Dr. Johney Maine. 11.Parkinson disease.  MEDICATIONS: 1. Aspirin 325 mg daily. 2. Atenolol 50 mg daily. 3. Prozac 60 mg daily. 4. Lasix 20 mg daily. 5. Lantus 40 units b.i.d. 6. Humalog 4 units b.i.d. 7. Losartan 100 mg daily. 8. Metformin 1000 mg b.i.d. 9. Multivitamin daily. 10.Zofran 8 mg q.8 p.r.n. 11.Phenergan 12.5 mg daily p.r.n. 12.Crestor 20 mg daily. 13.Tramadol 50 mg q.6 p.r.n.  ALLERGIES: 1. LATEX. 2. MORPHINE. 3. ADHESIVE. 4. CODEINE. 5. PENICILLIN.  SOCIAL HISTORY:  She does not use tobacco, alcohol, or recreational substances.  FAMILY HISTORY:   Remarkable for cirrhosis in her father and congestive heart failure in her mother.  REVIEW OF SYSTEMS:  She denies fever, chills, vomiting, change in bowel habits, rectal bleeding, or difficulty voiding.  PHYSICAL EXAMINATION:  VITAL SIGNS:  Temperature 98.2, pulse 69, respirations 20, blood pressure 120/54, oxygen saturation 93%. GENERAL:  Alert, oriented, and in no distress. HEENT:  No scleral icterus.  Eyes, nose, and oropharynx are unremarkable. NECK:  Supple with no JVD or thyromegaly. LUNGS:  Clear. HEART:  Regular with no murmurs.  She has well-healed sternotomy scar. ABDOMEN:  A large area of erythema with central induration and pus under the skin centrally.  There is tenderness and warmth of this area.  No palpable hepatosplenomegaly.  She has previous surgical scars and a palpable ventral hernia changes. EXTREMITIES:  Normal pulses.  No clubbing or edema. NEUROLOGIC:  No focal weakness. LYMPH NODES:  No cervical or supraclavicular enlargement  LABORATORY DATA:  White count 10.9, hemoglobin 12.3, platelets 379,000. Sodium 128, potassium 4.5, BUN 20, creatinine 0.97, calcium 10.5, glucose 474.  IMPRESSION/PLAN: 1. Abdominal wall cellulitis and abscess formation.  Admit for IV     antibiotics.  IV vancomycin will be started initially.  Pharmacy     will be consulted regarding dosing.  We will obtain surgical     consultation with Dr. Arnoldo Morale.  Her case was discussed with Dr.     Arnoldo Morale.  We will obtain a CT scan of the abdomen.  She had     recently had a CT scan of the abdomen for followup of her colon     cancer and no sign of recurrence was found. 2. Diabetes.  She is quite hyperglycemic on admission.  We will treat     with Lantus and NovoLog. 3. Hyponatremia.  We will avoid excess free water intake. 4. Coronary heart disease, stable on medical therapy. 5. Hypertension. 6. Hyperlipidemia. 7. Sleep apnea. 8. Parkinson disease. 9. Anxiety and depression.  Continue  Prozac. 10.Ventral hernia.     Paula Compton. Willey Blade, MD     ROF/MEDQ  D:  03/27/2013  T:  03/27/2013  Job:  [id number]

## 2013-03-27 NOTE — Progress Notes (Signed)
ANTIBIOTIC CONSULT NOTE - follow up  Pharmacy Consult for Vancomycin and Aztreonam Indication: cellulitis  Allergies  Allergen Reactions  . Latex Itching    Only where touched on body with it.  . Morphine Shortness Of Breath, Itching and Other (See Comments)    Makes patient feel as if her "insides" are on fire.  . Adhesive [Tape] Other (See Comments)    Patient states that she breaks out in blisters  . Codeine Itching and Rash    All over body.  . Penicillins Itching and Rash    Localized.   Patient Measurements: Height: 5\' 5"  (165.1 cm) Weight: 238 lb 15.7 oz (108.4 kg) IBW/kg (Calculated) : 57  Vital Signs: Temp: 98.5 F (36.9 C) (01/31 0643) Temp src: Oral (01/31 0643) BP: 120/54 mmHg (01/31 0643) Pulse Rate: 70 (01/31 0643) Intake/Output from previous day: 01/30 0701 - 01/31 0700 In: 100 [IV Piggyback:100] Out: 1050 [Urine:1050] Intake/Output from this shift:    Labs:  Recent Labs  03/26/13 1914  WBC 10.9*  HGB 12.3  PLT 379  CREATININE 0.97   Estimated Creatinine Clearance: 66.1 ml/min (by C-G formula based on Cr of 0.97). No results found for this basename: VANCOTROUGH, VANCOPEAK, VANCORANDOM, GENTTROUGH, GENTPEAK, GENTRANDOM, TOBRATROUGH, TOBRAPEAK, TOBRARND, AMIKACINPEAK, AMIKACINTROU, AMIKACIN,  in the last 72 hours   Microbiology: No results found for this or any previous visit (from the past 720 hour(s)).  Medical History: Past Medical History  Diagnosis Date  . Coronary artery disease     a. s/p stent to lad 2006 w subsequent kissing balloon pci to diagonal;  b.  pci of the lad w a drug-eluting stent July 2010;  c.  LHC 11/13/11: p-mLAD stent with prox 50% ISR, 80% beyond dist edge of stent, m+dLAD beyond stent 40-50%, mod caliber pD1 80%, mRCA 70%, m-dRCA 20-30%, EF 55-65% => med Rx (consider CABG)  . Diabetes mellitus   . Hypertension   . Dyslipidemia   . Morbid obesity   . GERD (gastroesophageal reflux disease)   . Varicose vein   .  Gastroparesis   . Osteoarthritis   . Blurred vision   . Acoustic neuroma     left ear  . Anxiety   . Depressed   . Asthma     AS CHILD   . Sleep apnea     12+ YRS NO MACHINE GOTTEN   . Anginal pain     DR Theola Sequin    . Headache(784.0)   . Fibromyalgia   . Hiatal hernia   . Adenocarcinoma     ascending colon, LNs pos -- S/P right hemicolectomy, xeloda completed 6/10 (dr. Sonny Dandy)  . Morbid obesity   . Parkinson disease    Medications:  Prescriptions prior to admission  Medication Sig Dispense Refill  . aspirin 325 MG EC tablet Take 325 mg by mouth every morning.      Marland Kitchen atenolol (TENORMIN) 50 MG tablet Take 50 mg by mouth at bedtime.      Marland Kitchen FLUoxetine (PROZAC) 20 MG capsule Take 20 mg by mouth 3 (three) times daily.       . furosemide (LASIX) 20 MG tablet Take 20 mg by mouth daily as needed.      . insulin glargine (LANTUS) 100 UNIT/ML injection Inject 70 Units into the skin at bedtime.       . Insulin Lispro, Human, (HUMALOG PEN [place]) Inject 4 Units into the skin 2 (two) times daily.       Marland Kitchen losartan (COZAAR)  100 MG tablet Take 100 mg by mouth daily.      . metFORMIN (GLUCOPHAGE) 500 MG tablet Take 1,000 mg by mouth 2 (two) times daily.      . Multiple Vitamin (MULTIVITAMIN WITH MINERALS) TABS Take 1 tablet by mouth every morning.       . ondansetron (ZOFRAN ODT) 8 MG disintegrating tablet Take 1 tablet (8 mg total) by mouth every 8 (eight) hours as needed for nausea.  12 tablet  0  . promethazine (PHENERGAN) 12.5 MG tablet Take 12.5-25 mg by mouth daily as needed. For nausea and vomiting      . rosuvastatin (CRESTOR) 40 MG tablet Take 20 mg by mouth daily.      . traMADol (ULTRAM) 50 MG tablet Take 1 tablet (50 mg total) by mouth every 6 (six) hours as needed for pain.  40 tablet  0   Assessment: 68yo obese female with abscess and cellulitis to mid upper abdomen.  Pt has good renal fxn.  Estimated Creatinine Clearance: 66.1 ml/min (by C-G formula based on Cr of 0.97).  Goal  of Therapy:  Vancomycin trough level 10-15 mcg/ml  Plan:  Vancomycin 1gm IV q12hrs Check trough at steady state Aztreonam 1gm IV q8h Monitor labs, renal fxn, and cultures  Hart Robinsons A 03/27/2013,9:33 AM

## 2013-03-28 ENCOUNTER — Inpatient Hospital Stay (HOSPITAL_COMMUNITY): Payer: Medicare Other

## 2013-03-28 LAB — BASIC METABOLIC PANEL
BUN: 19 mg/dL (ref 6–23)
CO2: 31 meq/L (ref 19–32)
CREATININE: 0.91 mg/dL (ref 0.50–1.10)
Calcium: 11.2 mg/dL — ABNORMAL HIGH (ref 8.4–10.5)
Chloride: 93 mEq/L — ABNORMAL LOW (ref 96–112)
GFR calc non Af Amer: 62 mL/min — ABNORMAL LOW (ref >90)
GFR, EST AFRICAN AMERICAN: 72 mL/min — AB (ref >90)
Glucose, Bld: 275 mg/dL — ABNORMAL HIGH (ref 70–99)
Potassium: 4.6 mEq/L (ref 3.7–5.3)
SODIUM: 133 meq/L — AB (ref 137–147)

## 2013-03-28 LAB — CBC
HCT: 37.8 % (ref 36.0–46.0)
Hemoglobin: 12.5 g/dL (ref 12.0–15.0)
MCH: 29.1 pg (ref 26.0–34.0)
MCHC: 33.1 g/dL (ref 30.0–36.0)
MCV: 87.9 fL (ref 78.0–100.0)
PLATELETS: 433 10*3/uL — AB (ref 150–400)
RBC: 4.3 MIL/uL (ref 3.87–5.11)
RDW: 13.5 % (ref 11.5–15.5)
WBC: 7.8 10*3/uL (ref 4.0–10.5)

## 2013-03-28 LAB — GLUCOSE, CAPILLARY
Glucose-Capillary: 301 mg/dL — ABNORMAL HIGH (ref 70–99)
Glucose-Capillary: 305 mg/dL — ABNORMAL HIGH (ref 70–99)
Glucose-Capillary: 248 mg/dL — ABNORMAL HIGH (ref 70–99)
Glucose-Capillary: 316 mg/dL — ABNORMAL HIGH (ref 70–99)

## 2013-03-28 LAB — MRSA PCR SCREENING: MRSA by PCR: NEGATIVE

## 2013-03-28 MED ORDER — INSULIN GLARGINE 100 UNIT/ML ~~LOC~~ SOLN
40.0000 [IU] | Freq: Two times a day (BID) | SUBCUTANEOUS | Status: DC
  Administered 2013-03-28: 40 [IU] via SUBCUTANEOUS
  Filled 2013-03-28 (×2): qty 0.4

## 2013-03-28 MED ORDER — SENNOSIDES-DOCUSATE SODIUM 8.6-50 MG PO TABS
1.0000 | ORAL_TABLET | Freq: Two times a day (BID) | ORAL | Status: DC
  Administered 2013-03-28 – 2013-03-31 (×7): 1 via ORAL
  Filled 2013-03-28 (×7): qty 1

## 2013-03-28 MED ORDER — INSULIN ASPART 100 UNIT/ML ~~LOC~~ SOLN
0.0000 [IU] | Freq: Three times a day (TID) | SUBCUTANEOUS | Status: DC
  Administered 2013-03-28: 7 [IU] via SUBCUTANEOUS
  Administered 2013-03-28: 15 [IU] via SUBCUTANEOUS
  Administered 2013-03-29: 11 [IU] via SUBCUTANEOUS
  Administered 2013-03-29 – 2013-03-30 (×2): 7 [IU] via SUBCUTANEOUS
  Administered 2013-03-30: 11 [IU] via SUBCUTANEOUS
  Administered 2013-03-30: 4 [IU] via SUBCUTANEOUS

## 2013-03-28 MED ORDER — CHLORHEXIDINE GLUCONATE 4 % EX LIQD
1.0000 "application " | Freq: Once | CUTANEOUS | Status: AC
  Administered 2013-03-28: 1 via TOPICAL
  Filled 2013-03-28: qty 15

## 2013-03-28 MED ORDER — INSULIN ASPART 100 UNIT/ML ~~LOC~~ SOLN
0.0000 [IU] | Freq: Every day | SUBCUTANEOUS | Status: DC
  Administered 2013-03-28: 4 [IU] via SUBCUTANEOUS
  Administered 2013-03-29 – 2013-03-30 (×2): 5 [IU] via SUBCUTANEOUS

## 2013-03-28 NOTE — Progress Notes (Signed)
Subjective: No complaints of pain.  Objective: Vital signs in last 24 hours: Temp:  [98.1 F (36.7 C)] 98.1 F (36.7 C) (02/01 0625) Pulse Rate:  [61-69] 61 (02/01 0625) Resp:  [20] 20 (02/01 0625) BP: (122-152)/(43-88) 145/52 mmHg (02/01 0625) SpO2:  [94 %-95 %] 94 % (02/01 0625) Weight:  [108.183 kg (238 lb 8 oz)] 108.183 kg (238 lb 8 oz) (02/01 0625) Last BM Date: 03/27/13  Intake/Output from previous day: 01/31 0701 - 02/01 0700 In: -  Out: 500 [Urine:500] Intake/Output this shift:    General appearance: alert, cooperative and no distress GI: Soft. Continues to have drainage from open wound.  Lab Results:   Recent Labs  03/26/13 1914 03/28/13 0542  WBC 10.9* 7.8  HGB 12.3 12.5  HCT 35.6* 37.8  PLT 379 433*   BMET  Recent Labs  03/26/13 1914 03/28/13 0542  NA 128* 133*  K 4.5 4.6  CL 88* 93*  CO2 29 31  GLUCOSE 474* 275*  BUN 20 19  CREATININE 0.97 0.91  CALCIUM 10.5 11.2*   PT/INR No results found for this basename: LABPROT, INR,  in the last 72 hours  Studies/Results: Ct Abdomen Pelvis W Contrast  03/27/2013   CLINICAL DATA:  History of multiple abdominal surgeries. Abdominal infection.  EXAM: CT ABDOMEN AND PELVIS WITH CONTRAST  TECHNIQUE: Multidetector CT imaging of the abdomen and pelvis was performed using the standard protocol following bolus administration of intravenous contrast.  CONTRAST:  100 mL of Omnipaque 300 intravenous contrast.  COMPARISON:  03/11/2013  FINDINGS: There is an ill-defined fluid collection along the anterior abdominal wall, along the midline adjacent to a hernia mesh. This measures 7.2 cm x 4 cm x 8 cm in size. There is inflammatory type stranding in the adjacent fat. This collection is all extraperitoneal. Is also new since the prior exam.  There are multiple abdominal wall hernias. There is a large base hernia of the mid to upper anterior abdominal wall but contains small bowel and a portion of the transverse colon.  There is a left anterior abdominal wall hernia containing small bowel in the mid abdomen extending to the left lower quadrant. There is a smaller right lower quadrant anterior abdominal wall hernia containing a portion of the sigmoid colon. There is no evidence of bowel incarceration, strangulation or obstruction.  Lung bases are essentially clear.  The heart is normal in size.  Liver is mildly enlarged.  No liver mass or focal lesion is seen.  Normal spleen. The gallbladder is seen, it is presumed to be surgically absent. There is mild dilation of common bile duct with normal distal tapering. This is stable. The pancreas is unremarkable. There is a diverticulum from the second portion of the duodenum that indents the pancreatic head. This is stable. No adrenal masses.  Right kidney is mildly displaced inferiorly by the liver. Kidneys otherwise unremarkable. Normal ureters and bladder. Uterus is surgically absent. No pelvic masses.  No bowel wall thickening or inflammatory changes.  No adenopathy.  No ascites.  No osteoblastic or osteolytic lesions.  IMPRESSION: 1. There is a new ill-defined fluid collection along the mid anterior abdominal wall, extending inferiorly from the periumbilical region, measuring 8 cm x 4 cm x 7.2 cm. This is consistent with a poorly defined abscess. It is associated with adjacent inflammatory change. It is new from the prior study. 2. No other acute findings or changes. 3. Multiple abdominal wall hernias without evidence of incarceration, strangulation or obstruction.  Electronically Signed   By: Lajean Manes M.D.   On: 03/27/2013 11:45    Anti-infectives: Anti-infectives   Start     Dose/Rate Route Frequency Ordered Stop   03/26/13 2200  aztreonam (AZACTAM) 1 g in dextrose 5 % 50 mL IVPB     1 g 100 mL/hr over 30 Minutes Intravenous 3 times per day 03/26/13 2152     03/26/13 1800  vancomycin (VANCOCIN) IVPB 1000 mg/200 mL premix     1,000 mg 200 mL/hr over 60 Minutes  Intravenous Every 12 hours 03/26/13 1657        Assessment/Plan: Impression: Abdominal wall abscess Plan: We'll take patient to the operating room tomorrow for incision and drainage of abdominal wall abscess. The risks and benefits of the procedure including bleeding and the possibility of poor healing were fully explained to the patient, who gave informed consent.  LOS: 2 days    Lisabeth Mian A 03/28/2013

## 2013-03-28 NOTE — Progress Notes (Signed)
  CARE MANAGEMENT ED NOTE 03/28/2013  Patient:  [name], [name]   Account Number:  [id number]  Date Initiated:  03/28/2013  Documentation initiated by:  Joan Flores  Subjective/Objective Assessment:     Subjective/Objective Assessment Detail:     38 female admitted into hospital due to an extensive area of cellulitis to the mid abdomen from a wound infection. Patient denies fevers, chills, and vomiting.  Patient also hyperglycemic elevated BS of 474. Treatment plan patient to receive IV antibiotics, and Lantus/Novolog and will continue to monitor.     Action/Plan:   Action/Plan Detail:   Anticipated DC Date:  03/31/2013     Status Recommendation to Physician:   Result of Recommendation:    Other ED Services  Consult Working [place]  CM consult    Choice offered to / List presented to:            Status of service:  Completed, signed off  ED Comments:   ED Comments Detail:

## 2013-03-29 ENCOUNTER — Encounter (HOSPITAL_COMMUNITY)
Admission: AD | Disposition: A | Discharge status: Home Health | Payer: Self-pay | Source: Ambulatory Visit | Attending: Internal Medicine

## 2013-03-29 ENCOUNTER — Encounter (HOSPITAL_COMMUNITY): Payer: Self-pay | Admitting: *Deleted

## 2013-03-29 ENCOUNTER — Inpatient Hospital Stay (HOSPITAL_COMMUNITY): Payer: Medicare Other | Admitting: Anesthesiology

## 2013-03-29 ENCOUNTER — Encounter (HOSPITAL_COMMUNITY): Payer: Medicare Other | Admitting: Anesthesiology

## 2013-03-29 HISTORY — PX: APPLICATION OF WOUND VAC: SHX5189

## 2013-03-29 HISTORY — PX: INCISION AND DRAINAGE ABSCESS: SHX5864

## 2013-03-29 LAB — CBC
HCT: 35.5 % — ABNORMAL LOW (ref 36.0–46.0)
Hemoglobin: 11.8 g/dL — ABNORMAL LOW (ref 12.0–15.0)
MCH: 29.1 pg (ref 26.0–34.0)
MCHC: 33.2 g/dL (ref 30.0–36.0)
MCV: 87.7 fL (ref 78.0–100.0)
PLATELETS: 385 10*3/uL (ref 150–400)
RBC: 4.05 MIL/uL (ref 3.87–5.11)
RDW: 13.6 % (ref 11.5–15.5)
WBC: 5.8 10*3/uL (ref 4.0–10.5)

## 2013-03-29 LAB — GLUCOSE, CAPILLARY
GLUCOSE-CAPILLARY: 402 mg/dL — AB (ref 70–99)
Glucose-Capillary: 271 mg/dL — ABNORMAL HIGH (ref 70–99)
Glucose-Capillary: 250 mg/dL — ABNORMAL HIGH (ref 70–99)
Glucose-Capillary: 265 mg/dL — ABNORMAL HIGH (ref 70–99)
Glucose-Capillary: 180 mg/dL — ABNORMAL HIGH (ref 70–99)
Glucose-Capillary: 237 mg/dL — ABNORMAL HIGH (ref 70–99)

## 2013-03-29 LAB — WOUND CULTURE

## 2013-03-29 LAB — BASIC METABOLIC PANEL
BUN: 20 mg/dL (ref 6–23)
CALCIUM: 10.5 mg/dL (ref 8.4–10.5)
CO2: 28 meq/L (ref 19–32)
CREATININE: 0.78 mg/dL (ref 0.50–1.10)
Chloride: 99 mEq/L (ref 96–112)
GFR calc non Af Amer: 83 mL/min — ABNORMAL LOW (ref >90)
Glucose, Bld: 331 mg/dL — ABNORMAL HIGH (ref 70–99)
Potassium: 4.6 mEq/L (ref 3.7–5.3)
SODIUM: 136 meq/L — AB (ref 137–147)

## 2013-03-29 LAB — SURGICAL PCR SCREEN
MRSA, PCR: NEGATIVE
STAPHYLOCOCCUS AUREUS: POSITIVE — AB

## 2013-03-29 SURGERY — INCISION AND DRAINAGE, ABSCESS
Anesthesia: General | Site: Abdomen

## 2013-03-29 MED ORDER — ONDANSETRON HCL 4 MG/2ML IJ SOLN: 4.0000 mg | Freq: Once | INTRAMUSCULAR | Status: DC | PRN

## 2013-03-29 MED ORDER — PROPOFOL 10 MG/ML IV BOLUS
INTRAVENOUS | Status: DC | PRN
  Administered 2013-03-29: 150 mg via INTRAVENOUS

## 2013-03-29 MED ORDER — ONDANSETRON HCL 4 MG/2ML IJ SOLN
4.0000 mg | Freq: Once | INTRAMUSCULAR | Status: AC
  Administered 2013-03-29: 4 mg via INTRAVENOUS

## 2013-03-29 MED ORDER — PROPOFOL 10 MG/ML IV BOLUS
INTRAVENOUS | Status: AC
  Filled 2013-03-29: qty 20

## 2013-03-29 MED ORDER — FENTANYL CITRATE 0.05 MG/ML IJ SOLN
25.0000 ug | INTRAMUSCULAR | Status: AC
  Administered 2013-03-29 (×2): 25 ug via INTRAVENOUS

## 2013-03-29 MED ORDER — FENTANYL CITRATE 0.05 MG/ML IJ SOLN
INTRAMUSCULAR | Status: DC | PRN
  Administered 2013-03-29: 50 ug via INTRAVENOUS

## 2013-03-29 MED ORDER — FENTANYL CITRATE 0.05 MG/ML IJ SOLN
INTRAMUSCULAR | Status: AC
  Filled 2013-03-29: qty 2

## 2013-03-29 MED ORDER — MIDAZOLAM HCL 2 MG/2ML IJ SOLN
1.0000 mg | INTRAMUSCULAR | Status: DC | PRN
  Administered 2013-03-29 (×2): 2 mg via INTRAVENOUS

## 2013-03-29 MED ORDER — MIDAZOLAM HCL 2 MG/2ML IJ SOLN
INTRAMUSCULAR | Status: AC
  Filled 2013-03-29: qty 2

## 2013-03-29 MED ORDER — CHLORHEXIDINE GLUCONATE CLOTH 2 % EX PADS
6.0000 | MEDICATED_PAD | Freq: Every day | CUTANEOUS | Status: DC
  Administered 2013-03-30 – 2013-03-31 (×2): 6 via TOPICAL

## 2013-03-29 MED ORDER — LACTATED RINGERS IV SOLN
INTRAVENOUS | Status: DC
  Administered 2013-03-30: 01:00:00 via INTRAVENOUS

## 2013-03-29 MED ORDER — FENTANYL CITRATE 0.05 MG/ML IJ SOLN: 25.0000 ug | INTRAMUSCULAR | Status: DC | PRN

## 2013-03-29 MED ORDER — INSULIN GLARGINE 100 UNIT/ML ~~LOC~~ SOLN
45.0000 [IU] | Freq: Two times a day (BID) | SUBCUTANEOUS | Status: DC
  Administered 2013-03-29 – 2013-03-30 (×4): 45 [IU] via SUBCUTANEOUS
  Filled 2013-03-29 (×7): qty 0.45

## 2013-03-29 MED ORDER — MUPIROCIN 2 % EX OINT
1.0000 "application " | TOPICAL_OINTMENT | Freq: Two times a day (BID) | CUTANEOUS | Status: DC
  Administered 2013-03-29 – 2013-03-31 (×5): 1 via NASAL
  Filled 2013-03-29: qty 22

## 2013-03-29 MED ORDER — LIDOCAINE HCL (CARDIAC) 10 MG/ML IV SOLN
INTRAVENOUS | Status: DC | PRN
  Administered 2013-03-29: 20 mg via INTRAVENOUS

## 2013-03-29 MED ORDER — SODIUM CHLORIDE 0.9 % IR SOLN
Status: DC | PRN
  Administered 2013-03-29: 3000 mL
  Administered 2013-03-29: 1000 mL

## 2013-03-29 MED ORDER — LACTATED RINGERS IV SOLN
INTRAVENOUS | Status: DC
  Administered 2013-03-29: 11:00:00 via INTRAVENOUS

## 2013-03-29 MED ORDER — ENOXAPARIN SODIUM 40 MG/0.4ML ~~LOC~~ SOLN
40.0000 mg | SUBCUTANEOUS | Status: DC
  Administered 2013-03-30 – 2013-03-31 (×2): 40 mg via SUBCUTANEOUS
  Filled 2013-03-29 (×2): qty 0.4

## 2013-03-29 MED ORDER — FENTANYL CITRATE 0.05 MG/ML IJ SOLN: 50.0000 ug | INTRAMUSCULAR | Status: DC | PRN

## 2013-03-29 MED ORDER — LIDOCAINE HCL (PF) 1 % IJ SOLN
INTRAMUSCULAR | Status: AC
  Filled 2013-03-29: qty 5

## 2013-03-29 SURGICAL SUPPLY — 31 items
BAG HAMPER (MISCELLANEOUS) ×3 IMPLANT
BANDAGE CONFORM 2  STR LF (GAUZE/BANDAGES/DRESSINGS) ×3 IMPLANT
CANISTER WOUND CARE 500ML ATS (WOUND CARE) ×2 IMPLANT
CLOTH BEACON ORANGE TIMEOUT ST (SAFETY) ×3 IMPLANT
COVER LIGHT HANDLE STERIS (MISCELLANEOUS) ×6 IMPLANT
DRSG VAC ATS SM SENSATRAC (GAUZE/BANDAGES/DRESSINGS) ×2 IMPLANT
ELECT REM PT RETURN 9FT ADLT (ELECTROSURGICAL) ×3
ELECTRODE REM PT RTRN 9FT ADLT (ELECTROSURGICAL) ×1 IMPLANT
GLOVE BIOGEL M STRL SZ7.5 (GLOVE) ×3 IMPLANT
GLOVE BIOGEL PI IND STRL 7.0 (GLOVE) IMPLANT
GLOVE BIOGEL PI IND STRL 8 (GLOVE) IMPLANT
GLOVE BIOGEL PI INDICATOR 7.0 (GLOVE) ×2
GLOVE BIOGEL PI INDICATOR 8 (GLOVE) ×2
GLOVE EXAM NITRILE LRG STRL (GLOVE) ×2 IMPLANT
GLOVE SKINSENSE NS SZ6.5 (GLOVE) ×2
GLOVE SKINSENSE NS SZ7.5 (GLOVE) ×2
GLOVE SKINSENSE STRL SZ6.5 (GLOVE) IMPLANT
GLOVE SKINSENSE STRL SZ7.5 (GLOVE) IMPLANT
GOWN STRL REUS W/TWL LRG LVL3 (GOWN DISPOSABLE) ×6 IMPLANT
HANDPIECE INTERPULSE COAX TIP (DISPOSABLE) ×3
IV NS IRRIG 3000ML ARTHROMATIC (IV SOLUTION) ×2 IMPLANT
KIT ROOM TURNOVER APOR (KITS) ×3 IMPLANT
MANIFOLD NEPTUNE II (INSTRUMENTS) ×3 IMPLANT
MARKER SKIN DUAL TIP RULER LAB (MISCELLANEOUS) ×3 IMPLANT
NS IRRIG 1000ML POUR BTL (IV SOLUTION) ×3 IMPLANT
PACK MINOR (CUSTOM PROCEDURE TRAY) ×3 IMPLANT
PAD ARMBOARD 7.5X6 YLW CONV (MISCELLANEOUS) ×3 IMPLANT
SET BASIN LINEN APH (SET/KITS/TRAYS/PACK) ×3 IMPLANT
SET HNDPC FAN SPRY TIP SCT (DISPOSABLE) IMPLANT
SPONGE LAP 18X18 X RAY DECT (DISPOSABLE) ×2 IMPLANT
SYR BULB IRRIGATION 50ML (SYRINGE) ×3 IMPLANT

## 2013-03-29 NOTE — Progress Notes (Signed)
NAMEJULIEANA, ESHLEMAN                ACCOUNT NO.:  [id number]  MEDICAL RECORD NO.:  [id number]  LOCATION:  A311                          FACILITY:  [facility]  PHYSICIAN:  Paula Compton. Willey Blade, MD       DATE OF BIRTH:  Jan 13, 1946  DATE OF PROCEDURE:  03/29/2013 DATE OF DISCHARGE:                                PROGRESS NOTE   HISTORY OF PRESENT ILLNESS:  She states her abdomen feels better.  She has been afebrile.  PHYSICAL EXAMINATION:  VITAL SIGNS:  Temperature 98, pulse 57, blood pressure 138/46. LUNGS:  Clear. HEART:  Regular with no murmurs. ABDOMEN:  She has improving erythema in the mid abdomen.  She has continued to have some drainage approximately a cup full of pus was expressed yesterday.  IMPRESSION/PLAN: 1. Abdominal wall abscess.  She will have additional drainage in the     operating room today.  This has been discussed with Dr. Arnoldo Morale.     Her white count is normal at 5.8.  We will continue vancomycin and     aztreonam. 2. Hyponatremia improved to 136. 3. Diabetes.  Glucoses over the past 24 hours have ranged from 238 to     355.  Glucose this morning was 331 despite increased doses of     Lantus and NovoLog yesterday.  Her wound culture is growing group B strep.     Paula Compton. Willey Blade, MD     ROF/MEDQ  D:  03/29/2013  T:  03/29/2013  Job:  [id number]

## 2013-03-29 NOTE — Progress Notes (Signed)
[name], [name]                ACCOUNT NO.:  [id number]  MEDICAL RECORD NO.:  [id number]  LOCATION:                                 FACILITY:  PHYSICIAN:  Paula Compton. Willey Blade, MD       DATE OF BIRTH:  1945/10/03  DATE OF PROCEDURE:  03/27/2013 DATE OF DISCHARGE:                                PROGRESS NOTE   [name] is comfortable and afebrile this morning.  She has had drainage of pus from her abdominal wound.  She is afebrile this morning.  She was quite hyperglycemic at 474 on admission yesterday.  VITAL SIGNS:  Temperature 98.5, pulse 70, respirations 20, blood pressure 120/54. LUNGS:  Clear. HEART:  Regular with no murmurs. ABDOMEN:  Improvement in the size of her erythematous area in the mid abdomen.  There is pus draining from the mid aspect of this wound.  IMPRESSION/PLAN: 1. Abdominal wall cellulitis with abscess.  Continue vancomycin.     Obtain CT scan of the abdomen.  Dr. Arnoldo Morale has seen the patient in     consultation.  He has added aztreonam.  It is not felt that she     requires more drainage with an operation at this point.  White     count is 10.9. 2. Diabetes.  Increase Lantus at 35 units twice a day.  Continue     sliding scale NovoLog. 3. Hyponatremia.  Serum sodium is 128.  We will recheck tomorrow. 4. Colon cancer.  She has had no sign of recurrence on a recent CT     earlier this month. 5. Hypertension well controlled on losartan and atenolol. 6. Coronary artery disease, status post coronary artery bypass graft,     stable.  Aspirin has been held for now. 7. Deep venous thrombosis prophylaxis.  Continue Lovenox.     Paula Compton. Willey Blade, MD     ROF/MEDQ  D:  03/27/2013  T:  03/28/2013  Job:  [id number]

## 2013-03-29 NOTE — Anesthesia Preprocedure Evaluation (Addendum)
Anesthesia Evaluation  Patient identified by MRN, date of birth, ID band Patient awake    Reviewed: Allergy & Precautions, H&P , NPO status , Patient's Chart, lab work & pertinent test results, reviewed documented beta blocker date and time   Airway Mallampati: III  Neck ROM: full    Dental  (+) Implants and Dental Advisory Given   Pulmonary asthma , sleep apnea ,  breath sounds clear to auscultation        Cardiovascular hypertension, Pt. on medications and Pt. on home beta blockers + angina + CAD and + Cardiac Stents Rhythm:Regular Rate:Normal     Neuro/Psych  Headaches, PSYCHIATRIC DISORDERS Anxiety Depression    GI/Hepatic hiatal hernia, GERD-  ,  Endo/Other  diabetes, Type 2Morbid obesity  Renal/GU      Musculoskeletal  (+) Fibromyalgia -  Abdominal   Peds  Hematology  (+) anemia ,   Anesthesia Other Findings   Reproductive/Obstetrics                          Anesthesia Physical Anesthesia Plan  ASA: III  Anesthesia Plan: General   Post-op Pain Management:    Induction: Intravenous  Airway Management Planned: LMA  Additional Equipment:   Intra-op Plan:   Post-operative Plan: Extubation in OR  Informed Consent: I have reviewed the patients History and Physical, chart, labs and discussed the procedure including the risks, benefits and alternatives for the proposed anesthesia with the patient or authorized representative who has indicated his/her understanding and acceptance.   Dental advisory given  Plan Discussed with:   Anesthesia Plan Comments:         Anesthesia Quick Evaluation                                  Anesthesia Evaluation  Patient identified by MRN, date of birth, ID band Patient awake    Reviewed: Allergy & Precautions, H&P , NPO status , Patient's Chart, lab work & pertinent test results  Airway Mallampati: II TM Distance: >3 FB      Dental  (+) Teeth Intact and Implants   Pulmonary shortness of breath,  breath sounds clear to auscultation        Cardiovascular hypertension, + CAD and + Cardiac Stents Rhythm:Regular     Neuro/Psych PSYCHIATRIC DISORDERS Anxiety Depression    GI/Hepatic hiatal hernia,   Endo/Other  Poorly Controlled, Type 2, Insulin DependentMorbid obesity  Renal/GU      Musculoskeletal  (+) Fibromyalgia -  Abdominal   Peds  Hematology   Anesthesia Other Findings   Reproductive/Obstetrics

## 2013-03-29 NOTE — Progress Notes (Signed)
[name], [name]                ACCOUNT NO.:  [id number]  MEDICAL RECORD NO.:  [id number]  LOCATION:  A311                          FACILITY:  [facility]  PHYSICIAN:  Paula Compton. Willey Blade, MD       DATE OF BIRTH:  Nov 10, 1945  DATE OF PROCEDURE:  03/28/2013 DATE OF DISCHARGE:                                PROGRESS NOTE   SUBJECTIVE:  [name] has had some drainage of her abdominal wound.  She complains of constipation.  She has been afebrile.  OBJECTIVE:  VITAL SIGNS:  Temperature 98.1, pulse 61, and blood pressure 145/52. LUNGS:  Clear. HEART:  Regular with no murmurs. ABDOMEN:  With pressure over her wound, a very large amount of pus was expressed.  She has a residual 1 cm opening.  We will defer the decision regarding packing to Dr. Arnoldo Morale.  She has an 8-cm abscess noted on CT, which I believe is what drained this morning as there was a very large amount of pus expressed.  She still has some firmness to the right side of this, which does not appear to communicate with the current opening.  IMPRESSION/PLAN: 1. Abdominal wall abscess/cellulitis.  Continue IV vancomycin.  The     decision regarding further drainage will be left to Dr. Arnoldo Morale.     This has been discussed with Dr. Arnoldo Morale.  Her white count has     normalized from 10.9 to 7.8. 2. Diabetes.  Her glucose this morning is 275.  We will increase her     NovoLog to a resistant scale and we will increase her Lantus to 40     units twice a day. 3. Hyponatremia.  Serum sodium has improved from 128 to 133. 4. Constipation.  Treat with Peri-Colace. 5. Hypercalcemia.  Serum calcium has risen to 11.2, we will recheck     this.  Previous levels were 10.5 and 10.1. 6. Hypoalbuminemia, 2.5.  Wound culture pending.     Paula Compton. Willey Blade, MD    ROF/MEDQ  D:  03/28/2013  T:  03/28/2013  Job:  [id number]

## 2013-03-29 NOTE — Op Note (Signed)
Patient:  [name]  DOB:  Nov 11, 1945  MRN:  [id number]   Preop Diagnosis:  Abdominal wall abscess  Postop Diagnosis:  Same  Procedure:  Incision and debridement of abdominal wall abscess  Surgeon:  Aviva Signs, M.D.  Anes:  General  Indications:  Patient is a 68 year old morbidly obese white female with multiple medical problems including uncontrolled diabetes mellitus who presented to the hospital with worsening cellulitis and drainage from an abdominal wall abscess. She has had multiple abdominal surgeries in the past, with multiple surgical scars present. Final culture results are pending. The patient now comes the operating room for incision and drainage/debridement of abdominal wall. The risks and benefits of the procedure were fully explained to the patient, who gave informed consent.  Procedure note:  The patient is placed the supine position. After general anesthesia was measured, the abdomen was prepped and draped using usual sterile technique with Betadine. Surgical site confirmation was performed.  The patient already had an open draining wound just superior to the umbilicus along the midline. This was extended inferiorly. Necrotic subcutaneous tissue was found. A small amount of purulent fluid was also found. All necrotic tissue was debrided. There was some tunneling inferior to the incision line. Necrotic tissue was sharply debrided using scissors. Pulse lavage was then used to clean up the wound. There is did not go into the abdominal cavity. The fascial/previously placed mesh was not exposed. A small black wound VAC sponge was then trimmed and inserted. A 3 x 5 cm wound was found with tunneling subcutaneously and inferiorly. The tunneling went approximately 2 cm inferiorly. The wound VAC appliance was then applied. Good suction was noted the end of the procedure.  All tape and needle counts were correct the end of the procedure. The patient was awakened and transferred to  PACU in stable condition.  Complications:  None  EBL:  Minimal  Specimen:  None

## 2013-03-29 NOTE — Transfer of Care (Signed)
Immediate Anesthesia Transfer of Care Note  Patient: [name]  Procedure(s) Performed: Procedure(s) (LRB): INCISION AND DRAINAGE ABDOMINAL WALL ABSCESS (N/A) MINOR APPLICATION OF WOUND VAC (N/A)  Patient Location: PACU  Anesthesia Type: General  Level of Consciousness: awake  Airway & Oxygen Therapy: Patient Spontanous Breathing and non-rebreather face mask  Post-op Assessment: Report given to PACU RN, Post -op Vital signs reviewed and stable and Patient moving all extremities  Post vital signs: Reviewed and stable  Complications: No apparent anesthesia complications

## 2013-03-29 NOTE — Anesthesia Procedure Notes (Signed)
Procedure Name: LMA Insertion Date/Time: 03/29/2013 11:47 AM Performed by: Vista Deck Pre-anesthesia Checklist: Patient identified, Patient being monitored, Emergency Drugs available, Timeout performed and Suction available Patient Re-evaluated:Patient Re-evaluated prior to inductionOxygen Delivery Method: Circle System Utilized Preoxygenation: Pre-oxygenation with 100% oxygen Intubation Type: IV induction Ventilation: Mask ventilation without difficulty LMA: LMA inserted LMA Size: 4.0 Number of attempts: 1 Placement Confirmation: positive ETCO2 and breath sounds checked- equal and bilateral

## 2013-03-29 NOTE — Progress Notes (Signed)
Inpatient Diabetes Program Recommendations  AACE/ADA: New Consensus Statement on Inpatient Glycemic Control (2013)  Target Ranges:  Prepandial:   less than 140 mg/dL      Peak postprandial:   less than 180 mg/dL (1-2 hours)      Critically ill patients:  140 - 180 mg/dL   Reason for Visit: Results for [name], [name] (MRN [id number]) as of 03/29/2013 10:54  Ref. Range 03/28/2013 16:07 03/28/2013 22:09 03/29/2013 07:54 03/29/2013 10:17 03/29/2013 10:29  Glucose-Capillary Latest Range: 70-99 mg/dL 248 (H) 316 (H) 271 (H) 250 (H) 265 (H)    Diabetes history: Type 2 diabetes Outpatient Diabetes medications: Lantus 70 units q HS, Humalog 4 units bid, Metformin 1000 mg bid Current orders for Inpatient glycemic control: Lantus 45 units bid, Novolog resistant tid with meals and HS  Note patient currently in surgery due to abdominal wall abscess.  Agree with increase in Lantus.  Consider checking A1C to determine pre-hospitalization glycemic control.  Also if CBG's continue to be elevated, may consider q 4 hour correction while basal insulin being titrated.    Adah Perl, RN, BC-ADM Inpatient Diabetes Coordinator Pager [phone]

## 2013-03-29 NOTE — Anesthesia Postprocedure Evaluation (Signed)
Anesthesia Post Note  Patient: [name]  Procedure(s) Performed: Procedure(s) (LRB): INCISION AND DRAINAGE ABDOMINAL WALL ABSCESS (N/A) MINOR APPLICATION OF WOUND VAC (N/A)  Anesthesia type: General  Patient location: PACU  Post pain: Pain level controlled  Post assessment: Post-op Vital signs reviewed, Patient's Cardiovascular Status Stable, Respiratory Function Stable, Patent Airway, No signs of Nausea or vomiting and Pain level controlled  Last Vitals:  Filed Vitals:   03/29/13 1225  BP: 127/54  Pulse: 55  Temp: 36.6 C  Resp: 16    Post vital signs: Reviewed and stable  Level of consciousness: awake and alert   Complications: No apparent anesthesia complications

## 2013-03-30 LAB — GLUCOSE, CAPILLARY
GLUCOSE-CAPILLARY: 189 mg/dL — AB (ref 70–99)
Glucose-Capillary: 265 mg/dL — ABNORMAL HIGH (ref 70–99)
Glucose-Capillary: 214 mg/dL — ABNORMAL HIGH (ref 70–99)
Glucose-Capillary: 400 mg/dL — ABNORMAL HIGH (ref 70–99)

## 2013-03-30 LAB — CBC
HCT: 36.5 % (ref 36.0–46.0)
Hemoglobin: 12.1 g/dL (ref 12.0–15.0)
MCH: 29.4 pg (ref 26.0–34.0)
MCHC: 33.2 g/dL (ref 30.0–36.0)
MCV: 88.6 fL (ref 78.0–100.0)
PLATELETS: 426 10*3/uL — AB (ref 150–400)
RBC: 4.12 MIL/uL (ref 3.87–5.11)
RDW: 13.8 % (ref 11.5–15.5)
WBC: 6.6 10*3/uL (ref 4.0–10.5)

## 2013-03-30 LAB — BASIC METABOLIC PANEL
BUN: 17 mg/dL (ref 6–23)
CHLORIDE: 99 meq/L (ref 96–112)
CO2: 29 meq/L (ref 19–32)
CREATININE: 0.83 mg/dL (ref 0.50–1.10)
Calcium: 10.9 mg/dL — ABNORMAL HIGH (ref 8.4–10.5)
GFR calc Af Amer: 81 mL/min — ABNORMAL LOW (ref >90)
GFR calc non Af Amer: 70 mL/min — ABNORMAL LOW (ref >90)
Glucose, Bld: 149 mg/dL — ABNORMAL HIGH (ref 70–99)
Potassium: 4.8 mEq/L (ref 3.7–5.3)
SODIUM: 139 meq/L (ref 137–147)

## 2013-03-30 MED ORDER — DEXTROSE 5 % IV SOLN
2.0000 g | INTRAVENOUS | Status: DC
  Administered 2013-03-30: 2 g via INTRAVENOUS
  Filled 2013-03-30 (×3): qty 2

## 2013-03-30 NOTE — Progress Notes (Signed)
1 Day Post-Op  Subjective: Mild incisional pain, but does feel better.  Objective: Vital signs in last 24 hours: Temp:  [97.1 F (36.2 C)-98 F (36.7 C)] 97.3 F (36.3 C) (02/03 0648) Pulse Rate:  [50-59] 54 (02/03 0648) Resp:  [13-24] 18 (02/03 0648) BP: (107-149)/(42-71) 131/48 mmHg (02/03 0648) SpO2:  [93 %-100 %] 96 % (02/03 0648) Weight:  [105.688 kg (233 lb)-106.142 kg (234 lb)] 106.142 kg (234 lb) (02/03 0648) Last BM Date: 03/29/13  Intake/Output from previous day: 02/02 0701 - 02/03 0700 In: 4573 [P.O.:480; I.V.:3543; IV Piggyback:550] Out: 0  Intake/Output this shift:    General appearance: alert, cooperative and no distress GI: Soft. Incision healing well by secondary intention. No purulent drainage present.  Lab Results:   Recent Labs  03/29/13 0522 03/30/13 0521  WBC 5.8 6.6  HGB 11.8* 12.1  HCT 35.5* 36.5  PLT 385 426*   BMET  Recent Labs  03/29/13 0522 03/30/13 0521  NA 136* 139  K 4.6 4.8  CL 99 99  CO2 28 29  GLUCOSE 331* 149*  BUN 20 17  CREATININE 0.78 0.83  CALCIUM 10.5 10.9*   PT/INR No results found for this basename: LABPROT, INR,  in the last 72 hours  Studies/Results: Chest 2 View  03/28/2013   CLINICAL DATA:  Weakness  EXAM: CHEST  2 VIEW  COMPARISON:  03/17/2012  FINDINGS: The heart size and vascular pattern are normal. Lungs are clear. No pneumothorax or effusion. Right Port-[name]-Cath noted.  IMPRESSION: No acute findings   Electronically Signed   By: Skipper Cliche M.D.   On: 03/28/2013 20:50    Anti-infectives: Anti-infectives   Start     Dose/Rate Route Frequency Ordered Stop   03/30/13 1000  cefTRIAXone (ROCEPHIN) 2 g in dextrose 5 % 50 mL IVPB     2 g 100 mL/hr over 30 Minutes Intravenous Every 24 hours 03/30/13 0739     03/26/13 2200  aztreonam (AZACTAM) 1 g in dextrose 5 % 50 mL IVPB  Status:  Discontinued     1 g 100 mL/hr over 30 Minutes Intravenous 3 times per day 03/26/13 2152 03/30/13 0739   03/26/13 1800   vancomycin (VANCOCIN) IVPB 1000 mg/200 mL premix  Status:  Discontinued     1,000 mg 200 mL/hr over 60 Minutes Intravenous Every 12 hours 03/26/13 1657 03/30/13 0739      Assessment/Plan: s/p Procedure(s): INCISION AND DRAINAGE ABDOMINAL WALL ABSCESS MINOR APPLICATION OF WOUND VAC Impression: Stable on postoperative day one. Wound healing well. Cultures reveal strep species. Patient cannot tolerate wound VAC due to skin reaction. Will continue normal saline wet to dries. Anticipate discharge in next 24-48 hours.  LOS: 4 days    [name] 03/30/2013

## 2013-03-30 NOTE — Anesthesia Postprocedure Evaluation (Signed)
Anesthesia Post Note  Patient: [name]  Procedure(s) Performed: Procedure(s) (LRB): INCISION AND DRAINAGE ABDOMINAL WALL ABSCESS (N/A) MINOR APPLICATION OF WOUND VAC (N/A)  Anesthesia type: General  Patient location: 311  Post pain: Pain level controlled  Post assessment: Post-op Vital signs reviewed, Patient's Cardiovascular Status Stable, Respiratory Function Stable, Patent Airway, No signs of Nausea or vomiting and Pain level controlled  Last Vitals:  Filed Vitals:   03/30/13 0648  BP: 131/48  Pulse: 54  Temp: 36.3 C  Resp: 18    Post vital signs: Reviewed and stable  Level of consciousness: awake and alert   Complications: No apparent anesthesia complications

## 2013-03-30 NOTE — Care Management Note (Addendum)
    Page 1 of 2   03/31/2013     12:01:52 PM   CARE MANAGEMENT NOTE 03/31/2013  Patient:  [name], [name]   Account Number:  [id number]  Date Initiated:  03/30/2013  Documentation initiated by:  Theophilus Kinds  Subjective/Objective Assessment:   Pt admitted from home with abd wall abcess. Pt lives alone but has many friends who are active in the care of the pt. Pt has cane, walker, and BSc for home use.     Action/Plan:   Pt will need HH Rn for wound care at discharge. Pt has chosen [facility] and [name] of [facility] is aware. Will continue to follow for discharge planning needs.   Anticipated DC Date:  03/31/2013   Anticipated DC Plan:  [name]  CM consult      [facility] Choice  HOME HEALTH   Choice offered to / List presented to:  C-1 Patient        [place] arranged  HH-1 RN      [employer].   Status of service:  Completed, signed off Medicare Important Message given?  YES (If response is "NO", the following Medicare IM given date fields will be blank) Date Medicare IM given:  03/31/2013 Date Additional Medicare IM given:    Discharge Disposition:  [name]  Per UR Regulation:    If discussed at Long Length of Stay Meetings, dates discussed:    Comments:  03/31/13 [name], RN BSN CM Pt discharged home today with [facility] RN. [name] of [facility] is aware and will collect the pts information from the chart. [place] services to start on 2/5/5 for dressing changes. Pt and pts nurse aware of discharge arrangements.  03/30/13 [name], RN BSN CM

## 2013-03-30 NOTE — Progress Notes (Signed)
Inpatient Diabetes Program Recommendations  AACE/ADA: New Consensus Statement on Inpatient Glycemic Control (2013)  Target Ranges:  Prepandial:   less than 140 mg/dL      Peak postprandial:   less than 180 mg/dL (1-2 hours)      Critically ill patients:  140 - 180 mg/dL   Reason for Assessment: Hyperglycemia  Diabetes history: Type 2 diabetes  Outpatient Diabetes medications: Lantus 70 units q HS, Humalog 4 units bid, Metformin 1000 mg bid  Current orders for Inpatient glycemic control: Lantus 45 units bid, Novolog resistant tid with meals and HS  Results for AJA, WHITEHAIR (MRN [id number]) as of 03/30/2013 15:46  Ref. Range 03/29/2013 07:54 03/29/2013 10:17 03/29/2013 10:29 03/29/2013 12:35 03/29/2013 16:46 03/29/2013 21:13 03/30/2013 07:58 03/30/2013 11:42  Glucose-Capillary Latest Range: 70-99 mg/dL 271 (H) 250 (H) 265 (H) 180 (H) 237 (H) 402 (H) 189 (H) 265 (H)   Note:  CBG pattern suggests Novolog meal coverage may be helpful to improve glycemic control.  Patient eating 100%.  Request MD consider adding Novolog 6 units tid meal coverage to be given in addition to correction (not given if patient eats < 50% or CBG < 80 mg/dl).  Thank you.  Keyla Milone S. Marcelline Mates, RN, CNS, CDE Inpatient Diabetes Program, team pager [phone]

## 2013-03-31 ENCOUNTER — Encounter (HOSPITAL_COMMUNITY): Payer: Self-pay | Admitting: General Surgery

## 2013-03-31 LAB — GLUCOSE, CAPILLARY
GLUCOSE-CAPILLARY: 114 mg/dL — AB (ref 70–99)
Glucose-Capillary: 226 mg/dL — ABNORMAL HIGH (ref 70–99)

## 2013-03-31 LAB — PTH, INTACT AND CALCIUM
CALCIUM TOTAL (PTH): 10.3 mg/dL (ref 8.4–10.5)
PTH: 102.9 pg/mL — AB (ref 14.0–72.0)

## 2013-03-31 MED ORDER — INSULIN LISPRO 100 UNIT/ML ~~LOC~~ SOLN: 10.0000 [IU] | Freq: Three times a day (TID) | SUBCUTANEOUS | Status: DC

## 2013-03-31 MED ORDER — CEPHALEXIN 500 MG PO CAPS: 500.0000 mg | ORAL_CAPSULE | Freq: Three times a day (TID) | ORAL | Status: DC

## 2013-03-31 MED ORDER — INSULIN GLARGINE 100 UNIT/ML ~~LOC~~ SOLN: 45.0000 [IU] | Freq: Two times a day (BID) | SUBCUTANEOUS | Status: DC

## 2013-03-31 MED ORDER — ALPRAZOLAM 0.5 MG PO TABS: 0.5000 mg | ORAL_TABLET | Freq: Three times a day (TID) | ORAL | Status: DC | PRN

## 2013-03-31 MED ORDER — HEPARIN SOD (PORK) LOCK FLUSH 100 UNIT/ML IV SOLN
500.0000 [IU] | INTRAVENOUS | Status: DC | PRN
  Filled 2013-03-31: qty 5

## 2013-03-31 NOTE — Progress Notes (Addendum)
Wound healing well. Agree with discharge. Will see patient in my office in several weeks. Normal saline wet-to-dry daily.

## 2013-03-31 NOTE — Progress Notes (Signed)
[name], SCHLAUCH                ACCOUNT NO.:  [id number]  MEDICAL RECORD NO.:  [id number]  LOCATION:  A311                          FACILITY:  [facility]  PHYSICIAN:  Paula Compton. Willey Blade, MD       DATE OF BIRTH:  [birth year]  DATE OF PROCEDURE:  03/30/2013 DATE OF DISCHARGE:                                PROGRESS NOTE   Zarie had no complications from her trip to the operating room yesterday for treatment of her abdominal wound.  PHYSICAL EXAMINATION:  VITAL SIGNS:  She has no fever this morning.  Her temperature is 97.3.  Blood pressure 131/48, pulse 54, respirations 18. GENERAL:  She is alert and comfortable appearing. LUNGS:  Clear. HEART:  Regular with no murmurs. ABDOMEN:  Erythema around the expanded wound opening with packing in place.  IMPRESSION/PLAN: 1. Abdominal wall abscess/cellulitis.  Culture reveals group B strep.     Infectious Disease and pharmacy recommend modification to     ceftriaxone 2 g IV q.24 hours.  This will be ordered this morning.     Vancomycin and aztreonam will be discontinued.  White count is     normal at 6.6. 2. Diabetes.  Fasting glucose is 149.  Glucoses yesterday ranged from     180 to 402. 3. Hypercalcemia.  Serum calcium had dropped from 11.2 to 10.5, but is     now 10.9.  We will check an intact PTH to evaluate for underlying     primary hyperparathyroidism.     Paula Compton. Willey Blade, MD     ROF/MEDQ  D:  03/30/2013  T:  03/31/2013  Job:  [id number]

## 2013-03-31 NOTE — Progress Notes (Signed)
03/31/13 1145 Discharge instructions reviewed with patient per M. Luan Pulling, RN. Copy of AVS, medication list, prescriptions, f/u appointment information given. Pt verbalizes understanding of wound care, instructions. Port-a-cath flushed per protocol and deaccessed per M.Luan Pulling, RN. Dressing dry and intact, changed this am. Home health RN aware of dressing change regimen. No c/o pain or discomfort at this time. Pt in stable condition awaiting discharge home. Pt may drive self home per Dr. Willey Blade, since her ride unable to come.

## 2013-03-31 NOTE — Progress Notes (Signed)
Nutrition Brief Note  RD pulled to chart due to LOS  Wt Readings from Last 15 Encounters:  03/31/13 230 lb 14.4 oz (104.736 kg)  03/31/13 230 lb 14.4 oz (104.736 kg)  03/25/13 246 lb (111.585 kg)  03/05/13 244 lb 4.8 oz (110.814 kg)  10/12/12 243 lb (110.224 kg)  03/23/12 255 lb (115.667 kg)  03/17/12 247 lb 12.8 oz (112.401 kg)  02/17/12 242 lb (109.77 kg)  02/09/12 249 lb 1.9 oz (113 kg)  01/27/12 242 lb 1.6 oz (109.816 kg)  01/27/12 242 lb 1.6 oz (109.816 kg)  01/16/12 242 lb (109.77 kg)  01/14/12 252 lb (114.306 kg)  11/29/11 249 lb (112.946 kg)  11/13/11 252 lb (114.306 kg)    Body mass index is 38.42 kg/(m^2). Patient meets criteria for obesity, class II based on current BMI.   Current diet order is carb modified, patient is consuming approximately 100% of meals at this time. Labs and medications reviewed.   No nutrition interventions warranted at this time. If nutrition issues arise, please consult RD.   Coretha Creswell A. Jimmye Norman, RD, LDN Pager: [phone]

## 2013-03-31 NOTE — Progress Notes (Signed)
03/31/13 1156 patient left floor in stable condition via w/c accompanied by nurse tech. Discharged home. Donavan Foil, RN

## 2013-03-31 NOTE — Plan of Care (Signed)
Problem: Discharge Progression Outcomes Goal: [place] arrangements in place Outcome: Completed/Met Date Met:  03/31/13 03/31/13 1144 Home health RN set up with [facility] per case management. Janeece Fitting, RN

## 2013-04-02 LAB — GLUCOSE, CAPILLARY: GLUCOSE-CAPILLARY: 290 mg/dL — AB (ref 70–99)

## 2013-04-02 NOTE — Discharge Summary (Signed)
NAMEALMINA, SCHUL                ACCOUNT NO.:  [id number]  MEDICAL RECORD NO.:  [id number]  LOCATION:                                 FACILITY:  PHYSICIAN:  Paula Compton. Willey Blade, MD       DATE OF BIRTH:  01-02-1946  DATE OF ADMISSION:  03/26/2013 DATE OF DISCHARGE:  02/04/2015LH                              DISCHARGE SUMMARY   DISCHARGE DIAGNOSES: 1. Abdominal wall abscess/cellulitis. 2. Diabetes. 3. Anxiety. 4. Hypertension. 5. Depression and anxiety.  DISCHARGE MEDICATIONS: 1. Keflex 500 mg t.i.d. for 10 more days. 2. Alprazolam 0.5 mg t.i.d. p.r.n. 3. Lantus 45 units b.i.d. 4. Humalog 10 units b.i.d. 5. Prozac 60 mg daily. 6. Metformin 1000 mg b.i.d. 7. Multivitamin daily. 8. Aspirin 325 mg daily. 9. Atenolol 50 mg daily. 10.Lasix 20 mg daily. 11.Losartan 100 mg daily.  HOSPITAL COURSE:  This patient is a 68 year old female with diabetes who presents with cellulitis and abscess formation in her abdominal wall. She underwent a CT scan of the abdomen which revealed an 8 cm abscess. She was seen in surgical consultation by Dr. Arnoldo Morale.  She had spontaneous drainage initially.  Her culture revealed group B strep. Her initial white count was 10.9.  She was treated with vancomycin and aztreonam initially and then was converted to ceftriaxone.  Her white count normalized to 7.8.  She was taken to the operating room on the 2nd and underwent drainage and debridement.  A wound VAC was initially placed, but was not tolerated and was discontinued.  Packing of the wound has been performed.  Arrangements were being made for home health with dressing changes and packing of the wound at home.  She will be converted to oral antibiotic therapy with cephalexin.  Her condition at discharge is much improved.  She will be seen in followup in 1 week.  She had a hyponatremia with a serum sodium of 128 initially which improved to 133.  She is also hypercalcemic with a calcium level up  to 11.2, then 10.9 and intact parathyroid hormone.  Level is pending.     Paula Compton. Willey Blade, MD     ROF/MEDQ  D:  03/31/2013  T:  04/01/2013  Job:  [id number]

## 2013-04-16 ENCOUNTER — Encounter (HOSPITAL_COMMUNITY): Payer: Medicare Other

## 2013-04-20 ENCOUNTER — Encounter (HOSPITAL_COMMUNITY): Payer: Medicare Other

## 2013-04-26 ENCOUNTER — Encounter (HOSPITAL_COMMUNITY): Payer: Medicare Other | Attending: Hematology and Oncology

## 2013-04-26 DIAGNOSIS — Z95828 Presence of other vascular implants and grafts: Secondary | ICD-10-CM

## 2013-04-26 DIAGNOSIS — C180 Malignant neoplasm of cecum: Secondary | ICD-10-CM

## 2013-04-26 DIAGNOSIS — C779 Secondary and unspecified malignant neoplasm of lymph node, unspecified: Secondary | ICD-10-CM

## 2013-04-26 DIAGNOSIS — Z452 Encounter for adjustment and management of vascular access device: Secondary | ICD-10-CM

## 2013-04-26 MED ORDER — HEPARIN SOD (PORK) LOCK FLUSH 100 UNIT/ML IV SOLN
INTRAVENOUS | Status: AC
  Filled 2013-04-26: qty 5

## 2013-04-26 MED ORDER — SODIUM CHLORIDE 0.9 % IJ SOLN
10.0000 mL | INTRAMUSCULAR | Status: DC | PRN
  Administered 2013-04-26: 10 mL via INTRAVENOUS

## 2013-04-26 MED ORDER — HEPARIN SOD (PORK) LOCK FLUSH 100 UNIT/ML IV SOLN
500.0000 [IU] | Freq: Once | INTRAVENOUS | Status: AC
  Administered 2013-04-26: 500 [IU] via INTRAVENOUS

## 2013-04-26 NOTE — Progress Notes (Signed)
[name] presented for Portacath access and flush. Proper placement of portacath confirmed by CXR. Portacath located right chest wall accessed with  H 20 needle. Good blood return present. Portacath flushed with 93ml NS and 500U/75ml Heparin and needle removed intact. Procedure without incident. Patient tolerated procedure well.

## 2013-05-27 ENCOUNTER — Encounter (HOSPITAL_COMMUNITY): Payer: Medicare Other

## 2013-05-28 ENCOUNTER — Ambulatory Visit (HOSPITAL_COMMUNITY): Payer: Medicare Other

## 2013-06-07 ENCOUNTER — Encounter (HOSPITAL_COMMUNITY): Payer: Medicare Other

## 2013-06-07 ENCOUNTER — Encounter (HOSPITAL_COMMUNITY): Payer: Medicare Other | Attending: Hematology and Oncology

## 2013-06-07 DIAGNOSIS — Z452 Encounter for adjustment and management of vascular access device: Secondary | ICD-10-CM

## 2013-06-07 DIAGNOSIS — Z95828 Presence of other vascular implants and grafts: Secondary | ICD-10-CM

## 2013-06-07 DIAGNOSIS — Z9889 Other specified postprocedural states: Secondary | ICD-10-CM | POA: Insufficient documentation

## 2013-06-07 DIAGNOSIS — C184 Malignant neoplasm of transverse colon: Secondary | ICD-10-CM

## 2013-06-07 MED ORDER — HEPARIN SOD (PORK) LOCK FLUSH 100 UNIT/ML IV SOLN
500.0000 [IU] | Freq: Once | INTRAVENOUS | Status: AC
  Administered 2013-06-07: 500 [IU] via INTRAVENOUS

## 2013-06-07 MED ORDER — SODIUM CHLORIDE 0.9 % IJ SOLN
10.0000 mL | INTRAMUSCULAR | Status: DC | PRN
  Administered 2013-06-07: 10 mL via INTRAVENOUS

## 2013-06-07 MED ORDER — HEPARIN SOD (PORK) LOCK FLUSH 100 UNIT/ML IV SOLN
INTRAVENOUS | Status: AC
  Filled 2013-06-07: qty 5

## 2013-06-07 NOTE — Progress Notes (Signed)
[name] presented for Portacath access and flush. Proper placement of portacath confirmed by CXR. Portacath located right chest wall accessed with  H 20 needle. Good blood return present. Portacath flushed with 62ml NS and 500U/33ml Heparin and needle removed intact. Procedure without incident. Patient tolerated procedure well.

## 2013-06-15 ENCOUNTER — Ambulatory Visit: Payer: Medicare Other | Admitting: Cardiology

## 2013-06-16 ENCOUNTER — Ambulatory Visit: Payer: Medicare Other | Admitting: Cardiovascular Disease

## 2013-06-29 ENCOUNTER — Encounter: Payer: Self-pay | Admitting: Physician Assistant

## 2013-07-09 ENCOUNTER — Encounter: Payer: Self-pay | Admitting: Physician Assistant

## 2013-07-09 ENCOUNTER — Encounter (HOSPITAL_COMMUNITY): Payer: Medicare Other

## 2013-07-09 ENCOUNTER — Ambulatory Visit (INDEPENDENT_AMBULATORY_CARE_PROVIDER_SITE_OTHER): Payer: Medicare Other | Admitting: Physician Assistant

## 2013-07-09 ENCOUNTER — Telehealth: Payer: Self-pay | Admitting: Oncology

## 2013-07-09 VITALS — BP 132/80 | HR 57 | Ht 65.0 in | Wt 251.1 lb

## 2013-07-09 DIAGNOSIS — Z85038 Personal history of other malignant neoplasm of large intestine: Secondary | ICD-10-CM

## 2013-07-09 NOTE — Telephone Encounter (Signed)
gave pt appt for lab and MD  for july 2015 °

## 2013-07-09 NOTE — Progress Notes (Signed)
Subjective:    Patient ID: [name], female    DOB: October 28, 1945, 68 y.o.   MRN: [id number]  HPI  [name] is a 68 year old white female known to Dr. Deatra Ina. She has multiple medical problems including history of coronary artery disease for which she says has CABG x3 in 2013. She had had previous coronary stents in 2010. Has insulin-dependent diabetes mellitus, morbid obesity, fibromyalgia, gastroparesis, CHF, and history of recurrent colon cancer. She was initially diagnosed with a cecal adenocarcinoma in 2009 and underwent right hemicolectomy with Dr. Johney Maine. She had a recurrence in 2011 and then had a transverse colectomy. In This was a T3 N1 lesion. She had been followed by Dr. Tyson Alias has recently established with Dr.Sherrill. Last colonoscopy was done in 2012 she had a 3 mm polyp from the sigmoid colon removed and was asked to follow up in 3 years. Path from this polyp showed a benign fibroplastic polyp. Patient is also had difficulty with ventral hernia for which she has undergone repairs and insertion of mesh. She had complication with an abdominal wall abscess within the past couple of months. She comes in today to schedule a followup colonoscopy. She has no specific complaints of changes in her bowel habits melena or hematochezia. She says she has a hemorrhoid and occasionally will see some bright red blood. She previously had been on Plavix, she is not sure when this was stopped but she states she has not been taking Plavix over the past year.  Review of Systems  Constitutional: Negative.   HENT: Negative.   Eyes: Negative.   Respiratory: Negative.   Cardiovascular: Negative.   Gastrointestinal: Negative.   Endocrine: Negative.   Genitourinary: Negative.   Musculoskeletal: Positive for arthralgias, gait problem and joint swelling.  Allergic/Immunologic: Negative.   Neurological: Negative.   Hematological: Negative.   Psychiatric/Behavioral: Negative.    Outpatient  Prescriptions Prior to Visit  Medication Sig Dispense Refill  . aspirin 325 MG EC tablet Take 325 mg by mouth every morning.      Marland Kitchen atenolol (TENORMIN) 50 MG tablet Take 50 mg by mouth at bedtime.      Marland Kitchen FLUoxetine (PROZAC) 20 MG capsule Take 20 mg by mouth 3 (three) times daily.       . furosemide (LASIX) 20 MG tablet Take 20 mg by mouth daily.      . insulin glargine (LANTUS) 100 UNIT/ML injection Inject 0.45 mLs (45 Units total) into the skin 2 (two) times daily.  10 mL  11  . insulin lispro (HUMALOG) 100 UNIT/ML injection Inject 10 Units into the skin 3 (three) times daily before meals.  10 mL  11  . metFORMIN (GLUCOPHAGE) 500 MG tablet Take 1,000 mg by mouth 2 (two) times daily.      . Multiple Vitamin (MULTIVITAMIN WITH MINERALS) TABS Take 1 tablet by mouth every morning.       Marland Kitchen ALPRAZolam (XANAX) 0.5 MG tablet Take 1 tablet (0.5 mg total) by mouth 3 (three) times daily as needed for anxiety. 30 minutes before MRI.  90 tablet  3  . cephALEXin (KEFLEX) 500 MG capsule Take 1 capsule (500 mg total) by mouth 3 (three) times daily.  30 capsule  0   No facility-administered medications prior to visit.   Patient Active Problem List   Diagnosis Date Noted  . Cellulitis 03/26/2013  . Vomiting 02/08/2012  . 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 Recurrent ventral incisional hernia 09/26/2010  . Obesity, Class III,  BMI 40-49.9 (morbid obesity) 09/26/2010  . Malignant neoplasm of colon, unspecified site 03/16/2009  . HYPERTENSION, BENIGN 02/06/2009  . PERSONAL HISTORY MALIG NEOPLASM LARGE INTESTINE 12/14/2008  . COUGH 10/26/2008  . DIASTOLIC HEART FAILURE, CHRONIC 09/27/2008  . CAD, NATIVE VESSEL 09/14/2008  . CHEST PAIN UNSPECIFIED 09/13/2008  . Malignant neoplasm of ascending colon 12/07/2007  . BENIGN NEOPLASM OF COLON 09/23/2007  . AODM 09/23/2007  . IRON DEFICIENCY 09/23/2007  . GASTROPARESIS 09/23/2007  . FIBROMYALGIA 09/23/2007  . FECAL OCCULT BLOOD 09/23/2007   History  Substance Use  Topics  . Smoking status: Never Smoker   . Smokeless tobacco: Never Used  . Alcohol Use: No     Comment: occasional   family history includes Asthma in her other; Cirrhosis in her father; Colon cancer in her other; Coronary artery disease in her other; Diabetes in her maternal grandmother; Heart failure in her mother.     Objective:   Physical Exam WD older white female in no acute distress. Blood pressure 132/80 pulse 57 height 5 foot 5 weight 251, BMI 41.7. HEENT; nontraumatic normocephalic EOMI PERRLA sclera anicteric, Supple; no JVD, Cardiovascular; regular rate and rhythm with S1-S2 no murmur rub or gallop she does have a sternal incisional scar, Pulmonary; clear bilaterally, Abdomen; obese soft several incisional scars no current signs of cellulitis, No palpable mass or hepatosplenomegaly bowel sounds are present, Rectal; exam not done, Extremities ;no clubbing cyanosis or edema skin warm and dry, Psych;mood and affect appropriate        Assessment & Plan:  #89  68 year old white female with personal history of adenocarcinoma of the cecum diagnosed 2009 with recurrence 2011 (T3 N1). She is status post right hemicolectomy then transverse colectomy. Due for followup colonoscopy #2 coronary artery disease status post CABG x3 2013 #3 morbid obesity BMI 41 #3 insulin-dependent diabetes mellitus #4 history of congestive heart failure #5 fibromyalgia #6 recurrent ventral incisional hernia ; recent abdominal wall abscess  Plan; patient is scheduled for colonoscopy with Dr. Michelene Heady discussed in detail with the patient and she is agreeable to proceed. Patient does have a Port-A-Cath and says she's very difficult to inspect and therefore procedure will be scheduled at [facility] with plans to access report for sedation.

## 2013-07-09 NOTE — Patient Instructions (Addendum)
You have been scheduled for a colonoscopy with propofol. Please follow written instructions given to you at your visit today.  We have also given you instructions for the Metformin, Latus and Humulog. We have given you the sample Prep for the colonoscopy. If you use inhalers (even only as needed), please bring them with you on the day of your procedure. Your physician has requested that you go to www.startemmi.com and enter the access code given to you at your visit today. This web site gives a general overview about your procedure. However, you should still follow specific instructions given to you by our office regarding your preparation for the procedure.

## 2013-07-12 ENCOUNTER — Other Ambulatory Visit: Payer: Self-pay | Admitting: *Deleted

## 2013-07-12 DIAGNOSIS — Z85038 Personal history of other malignant neoplasm of large intestine: Secondary | ICD-10-CM

## 2013-07-13 ENCOUNTER — Encounter: Payer: Self-pay | Admitting: Gastroenterology

## 2013-07-13 NOTE — Progress Notes (Signed)
Reviewed and agree with management. Ellaree Gear D. Susann Lawhorne, M.D., FACG  

## 2013-07-15 ENCOUNTER — Encounter: Payer: Medicare Other | Admitting: Gastroenterology

## 2013-07-16 ENCOUNTER — Encounter (HOSPITAL_COMMUNITY): Payer: Self-pay | Admitting: Pharmacy Technician

## 2013-07-16 ENCOUNTER — Encounter (HOSPITAL_COMMUNITY): Payer: Self-pay | Admitting: *Deleted

## 2013-07-20 ENCOUNTER — Encounter (HOSPITAL_COMMUNITY): Payer: Medicare Other | Attending: Hematology and Oncology

## 2013-07-20 DIAGNOSIS — Z95828 Presence of other vascular implants and grafts: Secondary | ICD-10-CM

## 2013-07-20 DIAGNOSIS — Z9889 Other specified postprocedural states: Secondary | ICD-10-CM | POA: Insufficient documentation

## 2013-07-20 DIAGNOSIS — C184 Malignant neoplasm of transverse colon: Secondary | ICD-10-CM

## 2013-07-20 DIAGNOSIS — Z452 Encounter for adjustment and management of vascular access device: Secondary | ICD-10-CM

## 2013-07-20 MED ORDER — HEPARIN SOD (PORK) LOCK FLUSH 100 UNIT/ML IV SOLN
INTRAVENOUS | Status: AC
  Filled 2013-07-20: qty 5

## 2013-07-20 MED ORDER — SODIUM CHLORIDE 0.9 % IJ SOLN
10.0000 mL | INTRAMUSCULAR | Status: DC | PRN
  Administered 2013-07-20: 10 mL via INTRAVENOUS

## 2013-07-20 MED ORDER — HEPARIN SOD (PORK) LOCK FLUSH 100 UNIT/ML IV SOLN
500.0000 [IU] | Freq: Once | INTRAVENOUS | Status: AC
  Administered 2013-07-20: 500 [IU] via INTRAVENOUS

## 2013-07-20 NOTE — Progress Notes (Signed)
[name] presented for Portacath access and flush. Proper placement of portacath confirmed by CXR. Portacath located right chest wall accessed with  H 20 needle. Good blood return present. Portacath flushed with 20ml NS and 500U/5ml Heparin and needle removed intact. Procedure without incident. Patient tolerated procedure well.   

## 2013-07-22 ENCOUNTER — Other Ambulatory Visit: Payer: Self-pay | Admitting: *Deleted

## 2013-08-20 ENCOUNTER — Ambulatory Visit (HOSPITAL_COMMUNITY): Payer: Medicare Other

## 2013-08-30 ENCOUNTER — Encounter (HOSPITAL_COMMUNITY): Payer: Medicare Other

## 2013-09-02 ENCOUNTER — Ambulatory Visit (HOSPITAL_BASED_OUTPATIENT_CLINIC_OR_DEPARTMENT_OTHER): Payer: Medicare Other | Admitting: Oncology

## 2013-09-02 ENCOUNTER — Ambulatory Visit (HOSPITAL_BASED_OUTPATIENT_CLINIC_OR_DEPARTMENT_OTHER): Payer: Medicare Other

## 2013-09-02 ENCOUNTER — Other Ambulatory Visit: Payer: Medicare Other

## 2013-09-02 ENCOUNTER — Telehealth: Payer: Self-pay

## 2013-09-02 VITALS — BP 131/57 | HR 66 | Temp 97.1°F | Resp 18 | Ht 65.0 in | Wt 252.0 lb

## 2013-09-02 DIAGNOSIS — C182 Malignant neoplasm of ascending colon: Secondary | ICD-10-CM

## 2013-09-02 DIAGNOSIS — Z95828 Presence of other vascular implants and grafts: Secondary | ICD-10-CM

## 2013-09-02 DIAGNOSIS — Z85038 Personal history of other malignant neoplasm of large intestine: Secondary | ICD-10-CM

## 2013-09-02 MED ORDER — HEPARIN SOD (PORK) LOCK FLUSH 100 UNIT/ML IV SOLN
500.0000 [IU] | Freq: Once | INTRAVENOUS | Status: AC
  Administered 2013-09-02: 500 [IU] via INTRAVENOUS
  Filled 2013-09-02: qty 5

## 2013-09-02 MED ORDER — SODIUM CHLORIDE 0.9 % IJ SOLN
10.0000 mL | INTRAMUSCULAR | Status: DC | PRN
  Administered 2013-09-02: 10 mL via INTRAVENOUS
  Filled 2013-09-02: qty 10

## 2013-09-02 NOTE — Patient Instructions (Signed)

## 2013-09-02 NOTE — Telephone Encounter (Signed)
Called patient to ask if she was keeping her port in for any reason. Patient stated she is keeping it in case of a knee surgery she is supposed to be getting and also in case something unexpected comes up, and uses it for blood draws because she is a hard stick. Patient denies having any complications with port. Informed patient the script for her flushes was put in. Patient denies any questions or concerns at this time.

## 2013-09-02 NOTE — Progress Notes (Signed)
  [facility] OFFICE PROGRESS NOTE   Diagnosis: Colon cancer  INTERVAL HISTORY:   She returns for scheduled followup of colon cancer. She was admitted 03/26/2013 with an abdominal wall abscess. She underwent surgical drainage. She is scheduled for a colonoscopy with Dr. Deatra Ina.  Ms. Nobile feels well. She has frequent bowel movements. Good appetite. She has a ventral hernia. Chronic pain at the left hip and knee.  Objective:  Vital signs in last 24 hours:  Blood pressure 131/57, pulse 66, temperature 97.1 F (36.2 C), temperature source Oral, resp. rate 18, height 5\' 5"  (1.651 m), weight 252 lb (114.306 kg).    HEENT: Neck without mass Lymphatics: No cervical, supraclavicular, axillary, or inguinal nodes Resp: Lungs clear bilaterally Cardio: Regular rate and rhythm GI: No hepatomegaly, reducible ventral hernia, no mass Vascular: No leg edema   Lab Results:  Lab Results  Component Value Date   CEA 1.4 03/05/2013    Imaging:  No results found.  Medications: I have reviewed the patient's current medications.  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 Indeterminate left liver lesion noted on a surveillance CT 04/16/2012, stable on an MRI 07/21/2012 , no liver lesions seen on a CT 03/11/2013 4. History of coronary artery disease  5. Diabetes  6. Status post removal of an acoustic neuroma  7. Hypertension  8. Admission with an abdominal wall abscess 03/26/2013   Disposition:  Ms. Stephenson remains in clinical remission from colon cancer. She will undergo a surveillance colonoscopy with Dr. Deatra Ina. She will return for an office visit in 6  months. We will followup on the CEA from today.  Betsy Coder, MD  09/02/2013  11:35 AM

## 2013-09-03 ENCOUNTER — Telehealth: Payer: Self-pay | Admitting: *Deleted

## 2013-09-03 ENCOUNTER — Other Ambulatory Visit: Payer: Self-pay | Admitting: Oncology

## 2013-09-03 ENCOUNTER — Telehealth: Payer: Self-pay | Admitting: Oncology

## 2013-09-03 DIAGNOSIS — C182 Malignant neoplasm of ascending colon: Secondary | ICD-10-CM

## 2013-09-03 LAB — CEA: CEA: 1.2 ng/mL (ref 0.0–5.0)

## 2013-09-03 NOTE — Telephone Encounter (Signed)
Per 07/09 POF spk w/Dee at [facility] to schedule portacath flush, lft msg for pt advising of apt's and mailed ltr. ....Marland KitchenKJ

## 2013-09-03 NOTE — Telephone Encounter (Signed)
Called pt with normal CEA results. She voiced understanding. 

## 2013-09-03 NOTE — Telephone Encounter (Signed)
Message copied by Brien Few on Fri Sep 03, 2013  9:48 AM ------      Message from: Ladell Pier      Created: Fri Sep 03, 2013  8:26 AM       Please call patient, cea is normal ------

## 2013-09-08 ENCOUNTER — Encounter (HOSPITAL_COMMUNITY): Payer: Self-pay | Admitting: *Deleted

## 2013-09-20 ENCOUNTER — Telehealth: Payer: Self-pay | Admitting: Gastroenterology

## 2013-09-20 NOTE — Telephone Encounter (Signed)
Pt states she is having problems with the prep. States she is having trouble with her sugar dropping. After further discussion pt has been drinking water and diet drinks. Discussed with pt that she needs to drink beverages with sugar in them. Pt states she ate a piece of bread about 41min ago. Pt instructed this was ok but not to eat anything else per Alonza Bogus PA. Pt aware.

## 2013-09-21 ENCOUNTER — Encounter (HOSPITAL_COMMUNITY): Payer: Medicare Other | Admitting: Anesthesiology

## 2013-09-21 ENCOUNTER — Encounter (HOSPITAL_COMMUNITY): Payer: Self-pay

## 2013-09-21 ENCOUNTER — Encounter (HOSPITAL_COMMUNITY)
Admission: RE | Disposition: A | Discharge status: Home / Self Care | Payer: Self-pay | Source: Ambulatory Visit | Attending: Gastroenterology

## 2013-09-21 ENCOUNTER — Ambulatory Visit (HOSPITAL_COMMUNITY)
Admission: RE | Admit: 2013-09-21 | Discharge: 2013-09-21 | Disposition: A | Discharge status: Home / Self Care | Payer: Medicare Other | Source: Ambulatory Visit | Attending: Gastroenterology | Admitting: Gastroenterology

## 2013-09-21 ENCOUNTER — Ambulatory Visit (HOSPITAL_COMMUNITY): Payer: Medicare Other | Admitting: Anesthesiology

## 2013-09-21 DIAGNOSIS — Z9861 Coronary angioplasty status: Secondary | ICD-10-CM | POA: Insufficient documentation

## 2013-09-21 DIAGNOSIS — F411 Generalized anxiety disorder: Secondary | ICD-10-CM | POA: Insufficient documentation

## 2013-09-21 DIAGNOSIS — Z7982 Long term (current) use of aspirin: Secondary | ICD-10-CM | POA: Diagnosis not present

## 2013-09-21 DIAGNOSIS — I10 Essential (primary) hypertension: Secondary | ICD-10-CM | POA: Insufficient documentation

## 2013-09-21 DIAGNOSIS — IMO0001 Reserved for inherently not codable concepts without codable children: Secondary | ICD-10-CM | POA: Diagnosis not present

## 2013-09-21 DIAGNOSIS — I509 Heart failure, unspecified: Secondary | ICD-10-CM | POA: Insufficient documentation

## 2013-09-21 DIAGNOSIS — Z951 Presence of aortocoronary bypass graft: Secondary | ICD-10-CM | POA: Diagnosis not present

## 2013-09-21 DIAGNOSIS — D649 Anemia, unspecified: Secondary | ICD-10-CM | POA: Diagnosis not present

## 2013-09-21 DIAGNOSIS — F329 Major depressive disorder, single episode, unspecified: Secondary | ICD-10-CM | POA: Insufficient documentation

## 2013-09-21 DIAGNOSIS — E119 Type 2 diabetes mellitus without complications: Secondary | ICD-10-CM | POA: Diagnosis not present

## 2013-09-21 DIAGNOSIS — F3289 Other specified depressive episodes: Secondary | ICD-10-CM | POA: Insufficient documentation

## 2013-09-21 DIAGNOSIS — I2510 Atherosclerotic heart disease of native coronary artery without angina pectoris: Secondary | ICD-10-CM | POA: Diagnosis not present

## 2013-09-21 DIAGNOSIS — Z09 Encounter for follow-up examination after completed treatment for conditions other than malignant neoplasm: Secondary | ICD-10-CM | POA: Diagnosis not present

## 2013-09-21 DIAGNOSIS — J45909 Unspecified asthma, uncomplicated: Secondary | ICD-10-CM | POA: Insufficient documentation

## 2013-09-21 DIAGNOSIS — Z794 Long term (current) use of insulin: Secondary | ICD-10-CM | POA: Diagnosis not present

## 2013-09-21 DIAGNOSIS — K219 Gastro-esophageal reflux disease without esophagitis: Secondary | ICD-10-CM | POA: Insufficient documentation

## 2013-09-21 DIAGNOSIS — Z85038 Personal history of other malignant neoplasm of large intestine: Secondary | ICD-10-CM | POA: Diagnosis not present

## 2013-09-21 DIAGNOSIS — Z79899 Other long term (current) drug therapy: Secondary | ICD-10-CM | POA: Insufficient documentation

## 2013-09-21 DIAGNOSIS — G4730 Sleep apnea, unspecified: Secondary | ICD-10-CM | POA: Insufficient documentation

## 2013-09-21 DIAGNOSIS — K449 Diaphragmatic hernia without obstruction or gangrene: Secondary | ICD-10-CM | POA: Diagnosis not present

## 2013-09-21 HISTORY — PX: COLONOSCOPY: SHX5424

## 2013-09-21 SURGERY — COLONOSCOPY
Anesthesia: Monitor Anesthesia Care

## 2013-09-21 MED ORDER — PROPOFOL 10 MG/ML IV BOLUS
INTRAVENOUS | Status: DC | PRN
  Administered 2013-09-21: 50 mg via INTRAVENOUS

## 2013-09-21 MED ORDER — PROPOFOL INFUSION 10 MG/ML OPTIME
INTRAVENOUS | Status: DC | PRN
  Administered 2013-09-21: 200 ug/kg/min via INTRAVENOUS

## 2013-09-21 MED ORDER — HEPARIN SOD (PORK) LOCK FLUSH 100 UNIT/ML IV SOLN
INTRAVENOUS | Status: AC
  Filled 2013-09-21: qty 5

## 2013-09-21 MED ORDER — LACTATED RINGERS IV SOLN
INTRAVENOUS | Status: DC | PRN
  Administered 2013-09-21: 09:00:00 via INTRAVENOUS

## 2013-09-21 MED ORDER — PROPOFOL 10 MG/ML IV BOLUS
INTRAVENOUS | Status: AC
  Filled 2013-09-21: qty 20

## 2013-09-21 NOTE — H&P (Signed)
HPI [name] is a 68 year old white female known to Dr. Deatra Ina. She has multiple medical problems including history of coronary artery disease for which she says has CABG x3 in 2013. She had had previous coronary stents in 2010. Has insulin-dependent diabetes mellitus, morbid obesity, fibromyalgia, gastroparesis, CHF, and history of recurrent colon cancer.  She was initially diagnosed with a cecal adenocarcinoma in 2009 and underwent right hemicolectomy with Dr. Johney Maine. She had a recurrence in 2011 and then had a transverse colectomy. In This was a T3 N1 lesion. She had been followed by Dr. Tyson Alias has recently established with Dr.Sherrill.  Last colonoscopy was done in 2012 she had a 3 mm polyp from the sigmoid colon removed and was asked to follow up in 3 years. Path from this polyp showed a benign fibroplastic polyp.  Patient is also had difficulty with ventral hernia for which she has undergone repairs and insertion of mesh. She had complication with an abdominal wall abscess within the past couple of months.  She comes in today to schedule a followup colonoscopy. She has no specific complaints of changes in her bowel habits melena or hematochezia. She says she has a hemorrhoid and occasionally will see some bright red blood. She previously had been on Plavix, she is not sure when this was stopped but she states she has not been taking Plavix over the past year.  Review of Systems  Constitutional: Negative.  HENT: Negative.  Eyes: Negative.  Respiratory: Negative.  Cardiovascular: Negative.  Gastrointestinal: Negative.  Endocrine: Negative.  Genitourinary: Negative.  Musculoskeletal: Positive for arthralgias, gait problem and joint swelling.  Allergic/Immunologic: Negative.  Neurological: Negative.  Hematological: Negative.  Psychiatric/Behavioral: Negative.  Outpatient Prescriptions Prior to Visit   Medication  Sig  Dispense  Refill   .  aspirin 325 MG EC tablet  Take 325 mg by mouth every  morning.     Marland Kitchen  atenolol (TENORMIN) 50 MG tablet  Take 50 mg by mouth at bedtime.     Marland Kitchen  FLUoxetine (PROZAC) 20 MG capsule  Take 20 mg by mouth 3 (three) times daily.     .  furosemide (LASIX) 20 MG tablet  Take 20 mg by mouth daily.     .  insulin glargine (LANTUS) 100 UNIT/ML injection  Inject 0.45 mLs (45 Units total) into the skin 2 (two) times daily.  10 mL  11   .  insulin lispro (HUMALOG) 100 UNIT/ML injection  Inject 10 Units into the skin 3 (three) times daily before meals.  10 mL  11   .  metFORMIN (GLUCOPHAGE) 500 MG tablet  Take 1,000 mg by mouth 2 (two) times daily.     .  Multiple Vitamin (MULTIVITAMIN WITH MINERALS) TABS  Take 1 tablet by mouth every morning.     Marland Kitchen  ALPRAZolam (XANAX) 0.5 MG tablet  Take 1 tablet (0.5 mg total) by mouth 3 (three) times daily as needed for anxiety. 30 minutes before MRI.  90 tablet  3   .  cephALEXin (KEFLEX) 500 MG capsule  Take 1 capsule (500 mg total) by mouth 3 (three) times daily.  30 capsule  0    No facility-administered medications prior to visit.    Patient Active Problem List    Diagnosis  Date Noted   .  Cellulitis  03/26/2013   .  Vomiting  02/08/2012   .  S/P CABG x 3  01/24/2012   .  Recurrent ventral incisional hernia  09/26/2010   .  Obesity, Class III, BMI 40-49.9 (morbid obesity)  09/26/2010   .  Malignant neoplasm of colon, unspecified site  03/16/2009   .  HYPERTENSION, BENIGN  02/06/2009   .  PERSONAL HISTORY MALIG NEOPLASM LARGE INTESTINE  12/14/2008   .  COUGH  10/26/2008   .  DIASTOLIC HEART FAILURE, CHRONIC  09/27/2008   .  CAD, NATIVE VESSEL  09/14/2008   .  CHEST PAIN UNSPECIFIED  09/13/2008   .  Malignant neoplasm of ascending colon  12/07/2007   .  BENIGN NEOPLASM OF COLON  09/23/2007   .  AODM  09/23/2007   .  IRON DEFICIENCY  09/23/2007   .  GASTROPARESIS  09/23/2007   .  FIBROMYALGIA  09/23/2007   .  FECAL OCCULT BLOOD  09/23/2007    History   Substance Use Topics   .  Smoking status:  Never Smoker    .  Smokeless tobacco:  Never Used   .  Alcohol Use:  No      Comment: occasional   family history includes Asthma in her other; Cirrhosis in her father; Colon cancer in her other; Coronary artery disease in her other; Diabetes in her maternal grandmother; Heart failure in her mother.  Objective:   Physical Exam WD older white female in no acute distress. Blood pressure 132/80 pulse 57 height 5 foot 5 weight 251, BMI 41.7. HEENT; nontraumatic normocephalic EOMI PERRLA sclera anicteric, Supple; no JVD, Cardiovascular; regular rate and rhythm with S1-S2 no murmur rub or gallop she does have a sternal incisional scar, Pulmonary; clear bilaterally, Abdomen; obese soft several incisional scars no current signs of cellulitis, No palpable mass or hepatosplenomegaly bowel sounds are present, Rectal; exam not done, Extremities ;no clubbing cyanosis or edema skin warm and dry, Psych;mood and affect appropriate  Assessment & Plan:   #40 68 year old white female with personal history of adenocarcinoma of the cecum diagnosed 2009 with recurrence 2011 (T3 N1). She is status post right hemicolectomy then transverse colectomy. Due for followup colonoscopy  #2 coronary artery disease status post CABG x3 2013  #3 morbid obesity BMI 41  #3 insulin-dependent diabetes mellitus  #4 history of congestive heart failure  #5 fibromyalgia  #6 recurrent ventral incisional hernia ; recent abdominal wall abscess  Plan; patient is scheduled for colonoscopy with Dr. Michelene Heady discussed in detail with the patient and she is agreeable to proceed.  Patient does have a Port-A-Cath and says she's very difficult to inspect and therefore procedure

## 2013-09-21 NOTE — Anesthesia Preprocedure Evaluation (Signed)
Anesthesia Evaluation  Patient identified by MRN, date of birth, ID band Patient awake    Reviewed: Allergy & Precautions, H&P , NPO status , Patient's Chart, lab work & pertinent test results, reviewed documented beta blocker date and time   Airway Mallampati: III  Neck ROM: full    Dental   Pulmonary asthma , sleep apnea ,          Cardiovascular hypertension, Pt. on medications and Pt. on home beta blockers + angina + CAD and + Cardiac Stents     Neuro/Psych  Headaches, PSYCHIATRIC DISORDERS Anxiety Depression Parkinson's disease    GI/Hepatic hiatal hernia, GERD-  ,  Endo/Other  diabetes, Type 2, Insulin Dependent, Oral Hypoglycemic AgentsMorbid obesity  Renal/GU      Musculoskeletal  (+) Fibromyalgia -  Abdominal   Peds  Hematology  (+) anemia ,   Anesthesia Other Findings   Reproductive/Obstetrics                           Anesthesia Physical  Anesthesia Plan  ASA: III  Anesthesia Plan: MAC   Post-op Pain Management:    Induction:   Airway Management Planned:   Additional Equipment:   Intra-op Plan:   Post-operative Plan:   Informed Consent: I have reviewed the patients History and Physical, chart, labs and discussed the procedure including the risks, benefits and alternatives for the proposed anesthesia with the patient or authorized representative who has indicated his/her understanding and acceptance.   Dental advisory given  Plan Discussed with: CRNA  Anesthesia Plan Comments:         Anesthesia Quick Evaluation

## 2013-09-21 NOTE — Transfer of Care (Signed)
Immediate Anesthesia Transfer of Care Note  Patient: [name]  Procedure(s) Performed: Procedure(s): COLONOSCOPY (N/A)  Patient Location: PACU and Endoscopy Unit  Anesthesia Type:MAC  Level of Consciousness: awake, alert  and oriented  Airway & Oxygen Therapy: Patient connected to nasal cannula oxygen  Post-op Assessment: Report given to PACU RN and Post -op Vital signs reviewed and stable  Post vital signs: Reviewed and stable  Complications: No apparent anesthesia complications

## 2013-09-21 NOTE — Op Note (Signed)
[facility] [place], [address]   COLONOSCOPY PROCEDURE REPORT  PATIENT: Laura, [name]  MR#: [id number] BIRTHDATE: Feb 21, 1946 , 68  yrs. old GENDER: Female ENDOSCOPIST: Inda Castle, MD REFERRED BT:[facility] Edrick Kins, M.D. PROCEDURE DATE:  09/21/2013 PROCEDURE:   Colonoscopy, diagnostic ASA CLASS:   Class III INDICATIONS:High risk patient with personal history of colon cancer 2009 with recurrence 2011 MEDICATIONS: MAC sedation, administered by CRNA  DESCRIPTION OF PROCEDURE:   After the risks benefits and alternatives of the procedure were thoroughly explained, informed consent was obtained.  A digital rectal exam revealed no abnormalities of the rectum.   The Pentax Ped Colon [id number] endoscope was introduced through the anus and advanced to the ileum. No adverse events experienced.   The quality of the prep was excellent using Suprep  The instrument was then slowly withdrawn as the colon was fully examined.      COLON FINDINGS: the ileocolonic anastomosis and the colocolonic anastomosis were identified.  The mucosa throughout the colon was normal.  No polyps or tumors were seen  No polyps or cancers were seen.  Retroflexed views revealed no abnormalities. The time to cecum=  .  Withdrawal time=8 minutes 0 seconds.  The scope was withdrawn and the procedure completed. COMPLICATIONS: There were no complications.  ENDOSCOPIC IMPRESSION: Normal colon (postoperative changes)  RECOMMENDATIONS: Colonoscopy 5 years   eSigned:  Inda Castle, MD 09/21/2013 10:00 AM   cc:

## 2013-09-21 NOTE — Anesthesia Postprocedure Evaluation (Signed)
Anesthesia Post Note  Patient: [name]  Procedure(s) Performed: Procedure(s) (LRB): COLONOSCOPY (N/A)  Anesthesia type: MAC  Patient location: PACU  Post pain: Pain level controlled  Post assessment: Post-op Vital signs reviewed  Last Vitals: BP 143/47  Pulse 62  Temp(Src) 36.5 C (Oral)  Resp 14  SpO2 100%  Post vital signs: Reviewed  Level of consciousness: awake  Complications: No apparent anesthesia complications

## 2013-09-22 ENCOUNTER — Encounter (HOSPITAL_COMMUNITY): Payer: Self-pay | Admitting: Gastroenterology

## 2013-10-01 ENCOUNTER — Encounter (HOSPITAL_COMMUNITY): Payer: Medicare Other

## 2013-10-12 ENCOUNTER — Telehealth: Payer: Self-pay | Admitting: *Deleted

## 2013-10-12 NOTE — Telephone Encounter (Signed)
Message from Myrtle asking to clarify why pt is scheduled for radiology. It seems she should be scheduled for port flush only. Attempted to return call. No answer. Will have scheduling follow up.

## 2013-10-14 ENCOUNTER — Telehealth: Payer: Self-pay | Admitting: *Deleted

## 2013-10-14 ENCOUNTER — Encounter (HOSPITAL_COMMUNITY): Payer: Medicare Other

## 2013-10-14 ENCOUNTER — Telehealth: Payer: Self-pay | Admitting: Oncology

## 2013-10-14 ENCOUNTER — Encounter (HOSPITAL_COMMUNITY): Admission: RE | Admit: 2013-10-14 | Payer: Medicare Other | Source: Ambulatory Visit

## 2013-10-14 ENCOUNTER — Other Ambulatory Visit (HOSPITAL_COMMUNITY): Payer: Medicare Other

## 2013-10-14 NOTE — Telephone Encounter (Signed)
Per Angie at [facility], Pt has been cancelling her port flushes, sent msg to Dr. Benay Spice and he advised me that they would contact concerning this matter...Marland KitchenMarland KitchenKJ

## 2013-10-14 NOTE — Telephone Encounter (Signed)
Per Dr. Benay Spice; called pt for update on port-a-cath care; Pt reports that she is getting port flushed 10/19/13 at [facility] and she states "I do want to keep it in because I have such bad veins"  Pt verbalized understanding that port needs to be flushed every 6-8 weeks.  Pt stated she will call office if any problems.  Confirmed appt with Dr. Benay Spice 03/14/14.

## 2013-10-19 ENCOUNTER — Encounter (HOSPITAL_COMMUNITY): Payer: Self-pay

## 2013-10-19 ENCOUNTER — Encounter (HOSPITAL_COMMUNITY)
Admission: RE | Admit: 2013-10-19 | Discharge: 2013-10-19 | Disposition: A | Discharge status: Home / Self Care | Payer: Medicare Other | Source: Ambulatory Visit | Attending: Internal Medicine | Admitting: Internal Medicine

## 2013-10-19 DIAGNOSIS — Z452 Encounter for adjustment and management of vascular access device: Secondary | ICD-10-CM | POA: Diagnosis not present

## 2013-10-19 DIAGNOSIS — C182 Malignant neoplasm of ascending colon: Secondary | ICD-10-CM | POA: Diagnosis present

## 2013-10-19 MED ORDER — HEPARIN SOD (PORK) LOCK FLUSH 100 UNIT/ML IV SOLN: 500.0000 [IU] | INTRAVENOUS | Status: DC | PRN

## 2013-10-19 MED ORDER — SODIUM CHLORIDE 0.9 % IJ SOLN
10.0000 mL | INTRAMUSCULAR | Status: AC | PRN
  Administered 2013-10-19: 10 mL
  Filled 2013-10-19: qty 10

## 2013-10-19 MED ORDER — HEPARIN SOD (PORK) LOCK FLUSH 100 UNIT/ML IV SOLN
INTRAVENOUS | Status: AC
  Filled 2013-10-19: qty 5

## 2013-10-19 NOTE — Progress Notes (Signed)
Pt came today for a port a cath flush. Using sterile technique port identified an attempt was made to withdraw blood to check for patency. Unable to withdrawn blood from port with multiple repositions of patient without success. Dr. Ria Comment office aware, with instructions for pt to return later in the week to try once again. If the port is unable to be flushed, notify MD with instructions to have port removed. Pt had reported that the port had not been flushed over 2 months. Pt is aware of the communication with the MD.

## 2013-10-22 ENCOUNTER — Encounter (HOSPITAL_COMMUNITY)
Admission: RE | Admit: 2013-10-22 | Discharge: 2013-10-22 | Disposition: A | Discharge status: Home / Self Care | Payer: Medicare Other | Source: Ambulatory Visit | Attending: Internal Medicine | Admitting: Internal Medicine

## 2013-10-22 DIAGNOSIS — Z452 Encounter for adjustment and management of vascular access device: Secondary | ICD-10-CM | POA: Diagnosis not present

## 2013-10-22 MED ORDER — HEPARIN SOD (PORK) LOCK FLUSH 100 UNIT/ML IV SOLN
500.0000 [IU] | INTRAVENOUS | Status: AC | PRN
  Administered 2013-10-22: 500 [IU]

## 2013-10-22 MED ORDER — SODIUM CHLORIDE 0.9 % IJ SOLN
10.0000 mL | INTRAMUSCULAR | Status: AC | PRN
  Administered 2013-10-22: 10 mL
  Filled 2013-10-22: qty 10

## 2013-10-22 MED ORDER — HEPARIN SOD (PORK) LOCK FLUSH 100 UNIT/ML IV SOLN
INTRAVENOUS | Status: AC
  Filled 2013-10-22: qty 5

## 2013-12-03 ENCOUNTER — Encounter (HOSPITAL_COMMUNITY)
Admission: RE | Admit: 2013-12-03 | Discharge: 2013-12-03 | Disposition: A | Discharge status: Home / Self Care | Payer: Medicare Other | Source: Ambulatory Visit | Attending: Internal Medicine | Admitting: Internal Medicine

## 2013-12-03 DIAGNOSIS — C189 Malignant neoplasm of colon, unspecified: Secondary | ICD-10-CM | POA: Insufficient documentation

## 2013-12-03 MED ORDER — SODIUM CHLORIDE 0.9 % IJ SOLN
10.0000 mL | INTRAMUSCULAR | Status: AC | PRN
  Administered 2013-12-03: 10 mL
  Filled 2013-12-03: qty 10

## 2013-12-03 MED ORDER — HEPARIN SOD (PORK) LOCK FLUSH 100 UNIT/ML IV SOLN
500.0000 [IU] | INTRAVENOUS | Status: AC | PRN
  Administered 2013-12-03: 500 [IU]
  Filled 2013-12-03 (×2): qty 5

## 2014-01-28 ENCOUNTER — Encounter (HOSPITAL_COMMUNITY)
Admission: RE | Admit: 2014-01-28 | Discharge: 2014-01-28 | Disposition: A | Discharge status: Home / Self Care | Payer: Medicare Other | Source: Ambulatory Visit | Attending: Internal Medicine | Admitting: Internal Medicine

## 2014-01-28 DIAGNOSIS — C189 Malignant neoplasm of colon, unspecified: Secondary | ICD-10-CM | POA: Insufficient documentation

## 2014-01-28 MED ORDER — HEPARIN SOD (PORK) LOCK FLUSH 100 UNIT/ML IV SOLN
500.0000 [IU] | INTRAVENOUS | Status: AC | PRN
Start: 2014-01-28 — End: 2014-01-28
  Administered 2014-01-28: 500 [IU]

## 2014-01-28 MED ORDER — SODIUM CHLORIDE 0.9 % IJ SOLN
10.0000 mL | INTRAMUSCULAR | Status: AC | PRN
  Administered 2014-01-28: 10 mL

## 2014-02-03 ENCOUNTER — Encounter (HOSPITAL_COMMUNITY): Payer: Self-pay | Admitting: Cardiovascular Disease

## 2014-03-03 DIAGNOSIS — E119 Type 2 diabetes mellitus without complications: Secondary | ICD-10-CM | POA: Diagnosis not present

## 2014-03-03 DIAGNOSIS — E785 Hyperlipidemia, unspecified: Secondary | ICD-10-CM | POA: Diagnosis not present

## 2014-03-03 DIAGNOSIS — M25551 Pain in right hip: Secondary | ICD-10-CM | POA: Diagnosis not present

## 2014-03-03 DIAGNOSIS — R1310 Dysphagia, unspecified: Secondary | ICD-10-CM | POA: Diagnosis not present

## 2014-03-08 DIAGNOSIS — M7061 Trochanteric bursitis, right hip: Secondary | ICD-10-CM | POA: Diagnosis not present

## 2014-03-14 ENCOUNTER — Other Ambulatory Visit: Payer: Medicare Other

## 2014-03-14 ENCOUNTER — Ambulatory Visit: Payer: Medicare Other | Admitting: Oncology

## 2014-03-21 ENCOUNTER — Telehealth: Payer: Self-pay | Admitting: *Deleted

## 2014-03-21 ENCOUNTER — Telehealth: Payer: Self-pay | Admitting: Nurse Practitioner

## 2014-03-21 NOTE — Telephone Encounter (Signed)
Confirm appointment rescheduled from 1/18 to 2/22. Mailed calendar.

## 2014-03-21 NOTE — Telephone Encounter (Signed)
Called pt to follow up on missed appt 1/18. She reports she had her appts mixed up. She requests to reschedule. Order sent to schedulers to contact pt with new appt.

## 2014-03-25 ENCOUNTER — Encounter (HOSPITAL_COMMUNITY)
Admission: RE | Admit: 2014-03-25 | Discharge: 2014-03-25 | Disposition: A | Discharge status: Home / Self Care | Payer: Medicare Other | Source: Ambulatory Visit | Attending: Internal Medicine | Admitting: Internal Medicine

## 2014-03-25 MED ORDER — HEPARIN SOD (PORK) LOCK FLUSH 100 UNIT/ML IV SOLN
500.0000 [IU] | INTRAVENOUS | Status: DC | PRN
  Filled 2014-03-25: qty 5

## 2014-03-25 MED ORDER — SODIUM CHLORIDE 0.9 % IJ SOLN
10.0000 mL | INTRAMUSCULAR | Status: DC | PRN
Start: 2014-03-25 — End: 2014-03-26

## 2014-03-25 NOTE — Progress Notes (Signed)
Unable to access patients portacath. Patient to return on feb 5th at 1100 to retry.

## 2014-03-29 ENCOUNTER — Telehealth: Payer: Self-pay | Admitting: *Deleted

## 2014-03-29 DIAGNOSIS — R1310 Dysphagia, unspecified: Secondary | ICD-10-CM | POA: Diagnosis not present

## 2014-03-30 NOTE — H&P (Signed)
  NTS SOAP Note  Vital Signs:  Vitals as of: 04/26/5496: Systolic 264: Diastolic 67: Heart Rate 58: Temp 96.93F: Height 29ft 5.5in: Weight 264Lbs 0 Ounces: Pain Level 5: BMI 43.26  BMI : 43.26 kg/m2  Subjective: This 69 year old female presents for of dysphagia.  Has some problems eating solid food.  Seems to get stuck.  Is getting worse.  No emesis noted.  No problems with liquids.  Review of Symptoms:  Constitutional:fatigue Head:unremarkable Eyes:unremarkable   sinus problems Cardiovascular:  unremarkable Respiratory:dyspnea, cough Gastrointestinabdominal pain Genitourinary:frequency joint and back pain dry Hematolgic/Lymphatic:unremarkable   Allergic/Immunologic:unremarkable   Past Medical History:  Reviewed  Past Medical History  Surgical History: TAH,  multiple ventral herniorrhaphies,  stent placement Medical Problems: IDDM,  colon malignancy,  high cholesterol,  chronic pain  Psychiatric History: Anxiety, Depression Allergies: morphine,  codein,  pcn Medications: prozac,  insulin,  metformin,  lasix,  asa,  tramadol,  atenolol   Social History:Reviewed  Social History  Preferred Language: English Race:  White Ethnicity: Not Hispanic / Latino Age: 32 year Marital Status:  S Alcohol: no   Smoking Status: Never smoker reviewed on 03/29/2014 Functional Status reviewed on 03/29/2014 ------------------------------------------------ Bathing: Normal Cooking: Normal Dressing: Normal Driving: Normal Eating: Normal Managing Meds: Normal Oral Care: Normal Shopping: Normal Toileting: Normal Transferring: Normal Walking: Normal Cognitive Status reviewed on 03/29/2014 ------------------------------------------------ Attention: Normal Decision Making: Normal Language: Normal Memory: Normal Motor: Normal Perception: Normal Problem Solving: Normal Visual and Spatial: Normal   Family History:Reviewed  Family Health History Mother, Healthy;   Father, Healthy;     Objective Information: General:Well appearing, well nourished in no distress. Throat:no erythema, exudates or lesions. Neck:Supple without lymphadenopathy.  Heart:RRR, no murmur Lungs:  CTA bilaterally, no wheezes, rhonchi, rales.  Breathing unlabored.  Assessment:Dysphagia  Diagnoses: 787.20  R13.10 Dysphagia (Dysphagia, unspecified)  Procedures: 15830 - OFFICE OUTPATIENT NEW 30 MINUTES    Plan:  Scheduled for EGD,  possible dilatation on 04/19/14.   Patient Education:Alternative treatments to surgery were discussed with patient (and family).  Risks and benefits  of procedure including bleeding and perforation were fully explained to the patient (and family) who gave informed consent. Patient/family questions were addressed.  Follow-up:Pending Surgery

## 2014-04-15 NOTE — Telephone Encounter (Signed)
none

## 2014-04-18 ENCOUNTER — Other Ambulatory Visit: Payer: Medicare Other

## 2014-04-18 ENCOUNTER — Ambulatory Visit (HOSPITAL_BASED_OUTPATIENT_CLINIC_OR_DEPARTMENT_OTHER): Payer: Medicare Other

## 2014-04-18 ENCOUNTER — Telehealth: Payer: Self-pay | Admitting: Nurse Practitioner

## 2014-04-18 ENCOUNTER — Ambulatory Visit (HOSPITAL_BASED_OUTPATIENT_CLINIC_OR_DEPARTMENT_OTHER): Payer: Medicare Other | Admitting: Nurse Practitioner

## 2014-04-18 VITALS — BP 151/57 | HR 60 | Temp 98.0°F | Resp 18 | Ht 65.0 in | Wt 263.9 lb

## 2014-04-18 DIAGNOSIS — Z85038 Personal history of other malignant neoplasm of large intestine: Secondary | ICD-10-CM

## 2014-04-18 DIAGNOSIS — Z452 Encounter for adjustment and management of vascular access device: Secondary | ICD-10-CM

## 2014-04-18 DIAGNOSIS — C182 Malignant neoplasm of ascending colon: Secondary | ICD-10-CM

## 2014-04-18 DIAGNOSIS — Z95828 Presence of other vascular implants and grafts: Secondary | ICD-10-CM

## 2014-04-18 MED ORDER — HEPARIN SOD (PORK) LOCK FLUSH 100 UNIT/ML IV SOLN
500.0000 [IU] | Freq: Once | INTRAVENOUS | Status: AC
  Administered 2014-04-18: 500 [IU] via INTRAVENOUS
  Filled 2014-04-18: qty 5

## 2014-04-18 MED ORDER — SODIUM CHLORIDE 0.9 % IJ SOLN
10.0000 mL | INTRAMUSCULAR | Status: DC | PRN
  Administered 2014-04-18: 10 mL via INTRAVENOUS
  Filled 2014-04-18: qty 10

## 2014-04-18 NOTE — Progress Notes (Signed)
  [facility] OFFICE PROGRESS NOTE   Diagnosis:  Colon cancer  INTERVAL HISTORY:   [name] returns as scheduled. Bowels moving regularly. She occasionally notes a small amount of bright red blood after straining for bowel movement. She attributes this to "internal hemorrhoids". No nausea or vomiting. She has periodic right-sided abdominal pain related to a hernia. The pain is unchanged. She has chronic pain involving the left hip. She fell about a month ago and has had some right hip pain since then. Energy level is poor. She has a good appetite.  Objective:  Vital signs in last 24 hours:  Blood pressure 151/57, pulse 60, temperature 98 F (36.7 C), temperature source Oral, resp. rate 18, height 5\' 5"  (1.651 m), weight 263 lb 14.4 oz (119.704 kg), SpO2 97 %.    HEENT: No thrush or ulcers. Lymphatics: No palpable cervical, supra clavicular, axillary or inguinal lymph nodes. Resp: Lungs clear bilaterally. Cardio: Regular rate and rhythm. GI: Abdomen is soft. No hepatomegaly. Ventral hernia. Vascular: No leg edema. Port-A-Cath without erythema. Lab Results:  Lab Results  Component Value Date   WBC 6.6 03/30/2013   HGB 12.1 03/30/2013   HCT 36.5 03/30/2013   MCV 88.6 03/30/2013   PLT 426* 03/30/2013   NEUTROABS 8.9* 03/26/2013    Imaging:  No results found.  Medications: I have reviewed the patient's current medications.  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 Indeterminate left liver lesion noted on a surveillance CT 04/16/2012, stable on an MRI 07/21/2012 , no liver lesions seen on a CT 03/11/2013 4. History of coronary artery  disease  5. Diabetes  6. Status post removal of an acoustic neuroma  7. Hypertension  8. Admission with an abdominal wall abscess 03/26/2013 9. Colonoscopy 09/21/2013 no polyps or tumors seen. Next colonoscopy recommended in 5 years.   Disposition: [name] remains in clinical remission from colon cancer. We will follow-up on the CEA from today. She will return for a follow-up visit and CEA in 6 months.   We discussed having the Port-A-Cath removed due to the blood clot and infection risk. She would like to leave the Port-A-Cath in place. She understands the importance of having the Port-A-Cath flushed every 6 weeks.  She will contact her primary doctor if the right hip pain persists.  Plan reviewed with Dr. Benay Spice.  Ned Card ANP/GNP-BC   04/18/2014  12:34 PM

## 2014-04-18 NOTE — Addendum Note (Signed)
Addended by: Daisy Floro R on: 04/18/2014 01:17 PM   Modules accepted: Medications

## 2014-04-18 NOTE — Patient Instructions (Signed)

## 2014-04-18 NOTE — Telephone Encounter (Signed)
Gave avs & calendar for April,May,June. °

## 2014-04-19 ENCOUNTER — Ambulatory Visit (HOSPITAL_COMMUNITY)
Admission: RE | Admit: 2014-04-19 | Discharge: 2014-04-19 | Disposition: A | Discharge status: Home / Self Care | Payer: Medicare Other | Source: Ambulatory Visit | Attending: General Surgery | Admitting: General Surgery

## 2014-04-19 ENCOUNTER — Telehealth: Payer: Self-pay | Admitting: *Deleted

## 2014-04-19 ENCOUNTER — Encounter (HOSPITAL_COMMUNITY)
Admission: RE | Disposition: A | Discharge status: Home / Self Care | Payer: Self-pay | Source: Ambulatory Visit | Attending: General Surgery

## 2014-04-19 ENCOUNTER — Encounter (HOSPITAL_COMMUNITY): Payer: Self-pay | Admitting: *Deleted

## 2014-04-19 DIAGNOSIS — Z7982 Long term (current) use of aspirin: Secondary | ICD-10-CM | POA: Insufficient documentation

## 2014-04-19 DIAGNOSIS — Z85038 Personal history of other malignant neoplasm of large intestine: Secondary | ICD-10-CM | POA: Diagnosis not present

## 2014-04-19 DIAGNOSIS — E119 Type 2 diabetes mellitus without complications: Secondary | ICD-10-CM | POA: Diagnosis not present

## 2014-04-19 DIAGNOSIS — R1310 Dysphagia, unspecified: Secondary | ICD-10-CM | POA: Diagnosis not present

## 2014-04-19 DIAGNOSIS — Z90710 Acquired absence of both cervix and uterus: Secondary | ICD-10-CM | POA: Insufficient documentation

## 2014-04-19 DIAGNOSIS — E780 Pure hypercholesterolemia: Secondary | ICD-10-CM | POA: Insufficient documentation

## 2014-04-19 DIAGNOSIS — F419 Anxiety disorder, unspecified: Secondary | ICD-10-CM | POA: Diagnosis not present

## 2014-04-19 DIAGNOSIS — Z886 Allergy status to analgesic agent status: Secondary | ICD-10-CM | POA: Insufficient documentation

## 2014-04-19 DIAGNOSIS — Z880 Allergy status to penicillin: Secondary | ICD-10-CM | POA: Insufficient documentation

## 2014-04-19 DIAGNOSIS — Z794 Long term (current) use of insulin: Secondary | ICD-10-CM | POA: Insufficient documentation

## 2014-04-19 DIAGNOSIS — F329 Major depressive disorder, single episode, unspecified: Secondary | ICD-10-CM | POA: Insufficient documentation

## 2014-04-19 HISTORY — PX: ESOPHAGOGASTRODUODENOSCOPY: SHX5428

## 2014-04-19 LAB — GLUCOSE, CAPILLARY: Glucose-Capillary: 192 mg/dL — ABNORMAL HIGH (ref 70–99)

## 2014-04-19 LAB — CEA: CEA: 1.2 ng/mL (ref 0.0–5.0)

## 2014-04-19 SURGERY — EGD (ESOPHAGOGASTRODUODENOSCOPY)
Anesthesia: Moderate Sedation

## 2014-04-19 MED ORDER — MIDAZOLAM HCL 5 MG/5ML IJ SOLN
INTRAMUSCULAR | Status: AC
  Filled 2014-04-19: qty 10

## 2014-04-19 MED ORDER — BUTAMBEN-TETRACAINE-BENZOCAINE 2-2-14 % EX AERO
INHALATION_SPRAY | CUTANEOUS | Status: DC | PRN
  Administered 2014-04-19: 2 via TOPICAL

## 2014-04-19 MED ORDER — SODIUM CHLORIDE 0.9 % IV SOLN
INTRAVENOUS | Status: DC
  Administered 2014-04-19: 08:00:00 via INTRAVENOUS

## 2014-04-19 MED ORDER — MIDAZOLAM HCL 5 MG/5ML IJ SOLN
INTRAMUSCULAR | Status: DC | PRN
  Administered 2014-04-19: 2 mg via INTRAVENOUS
  Administered 2014-04-19: 1 mg via INTRAVENOUS

## 2014-04-19 MED ORDER — MEPERIDINE HCL 50 MG/ML IJ SOLN
INTRAMUSCULAR | Status: AC
  Filled 2014-04-19: qty 1

## 2014-04-19 MED ORDER — MEPERIDINE HCL 50 MG/ML IJ SOLN
INTRAMUSCULAR | Status: DC | PRN
  Administered 2014-04-19: 50 mg via INTRAVENOUS

## 2014-04-19 MED ORDER — STERILE WATER FOR IRRIGATION IR SOLN
Status: DC | PRN
  Administered 2014-04-19: 08:00:00

## 2014-04-19 NOTE — Telephone Encounter (Signed)
Called pt with normal CEA results. She voiced understanding. 

## 2014-04-19 NOTE — Discharge Instructions (Signed)
Esophagogastroduodenoscopy °Care After °Refer to this sheet in the next few weeks. These instructions provide you with information on caring for yourself after your procedure. Your caregiver may also give you more specific instructions. Your treatment has been planned according to current medical practices, but problems sometimes occur. Call your caregiver if you have any problems or questions after your procedure.  °HOME CARE INSTRUCTIONS °· Do not eat or drink anything until the numbing medicine (local anesthetic) has worn off and your gag reflex has returned. You will know that the local anesthetic has worn off when you can swallow comfortably. °· Do not drive for 12 hours after the procedure or as directed by your caregiver. °· Only take medicines as directed by your caregiver. °SEEK MEDICAL CARE IF:  °· You cannot stop coughing. °· You are not urinating at all or less than usual. °SEEK IMMEDIATE MEDICAL CARE IF: °· You have difficulty swallowing. °· You cannot eat or drink. °· You have worsening throat or chest pain. °· You have dizziness, lightheadedness, or you faint. °· You have nausea or vomiting. °· You have chills. °· You have a fever. °· You have severe abdominal pain. °· You have black, tarry, or bloody stools. °Document Released: 01/29/2012 Document Reviewed: 01/29/2012 °ExitCare® Patient Information ©2015 [employer]. This information is not intended to replace advice given to you by your health care provider. Make sure you discuss any questions you have with your health care provider. ° °

## 2014-04-19 NOTE — Op Note (Signed)
[facility] [address] [place], [address]   ENDOSCOPY PROCEDURE REPORT  PATIENT: [name], [name]  MR#: [id number] BIRTHDATE: 10/17/1945 , 69  yrs. old GENDER: female ENDOSCOPIST: Aviva Signs, MD REFERRED BY:  Asencion Noble, M.D. PROCEDURE DATE:  May 03, 2014 PROCEDURE:  EGD w/ biopsy for H.pylori ASA CLASS:     Class II INDICATIONS:  dysphagia. MEDICATIONS: Demerol 50 mg IV and Versed 3 mg IV TOPICAL ANESTHETIC: Cetacaine Spray  DESCRIPTION OF PROCEDURE: After the risks benefits and alternatives of the procedure were thoroughly explained, informed consent was obtained.  The [id number] ([id number]) endoscope was introduced through the mouth and advanced to the second portion of the duodenum , Without limitations.  The instrument was slowly withdrawn as the mucosa was fully examined.    EXAM: The esophagus and gastroesophageal junction were completely normal in appearance.  The stomach was entered and closely examined.The antrum, angularis, and lesser curvature were well visualized, including a retroflexed view of the cardia and fundus. The stomach wall was normally distensable.  The scope passed easily through the pylorus into the duodenum.   No hiatal hernia noted.  Z line at 40cm from the teeth.  No esophagitis, stricture noted. Retroflexed views revealed no abnormalities.     The scope was then withdrawn from the patient and the procedure completed.  COMPLICATIONS: There were no immediate complications.  ENDOSCOPIC IMPRESSION: 1.   Normal appearing esophagus and GE junction, the stomach was well visualized and normal in appearance, normal appearing duodenum  2.   No hiatal hernia noted.  Z line at 40cm from the teeth.  No esophagitis, stricture noted  RECOMMENDATIONS: If dysphagia persists, may need swallowing study to assess muscular dysfunction. H. Pylori clo test pending. REPEAT EXAM:  eSigned:  Aviva Signs, MD 05/03/2014 8:39 AM    CC:  CPT  CODES: ICD CODES:  The ICD and CPT codes recommended by this software are interpretations from the data that the clinical staff has captured with the software.  The verification of the translation of this report to the ICD and CPT codes and modifiers is the sole responsibility of the health care institution and practicing physician where this report was generated.  [place]. will not be held responsible for the validity of the ICD and CPT codes included on this report.  AMA assumes no liability for data contained or not contained herein. CPT is a Designer, television/film set of the [employer].

## 2014-04-19 NOTE — Interval H&P Note (Signed)
History and Physical Interval Note:  04/19/2014 8:11 AM  [name]  has presented today for surgery, with the diagnosis of dysphagia  The various methods of treatment have been discussed with the patient and family. After consideration of risks, benefits and other options for treatment, the patient has consented to  Procedure(s): ESOPHAGOGASTRODUODENOSCOPY (EGD) (N/A) MALONEY DILATION (N/A) as a surgical intervention .  The patient's history has been reviewed, patient examined, no change in status, stable for surgery.  I have reviewed the patient's chart and labs.  Questions were answered to the patient's satisfaction.     Aviva Signs A

## 2014-04-19 NOTE — Telephone Encounter (Signed)
-----   Message from Ladell Pier, MD sent at 04/19/2014  1:26 PM EST ----- Please call patient, cea is normal

## 2014-04-20 ENCOUNTER — Encounter (HOSPITAL_COMMUNITY): Payer: Self-pay | Admitting: General Surgery

## 2014-04-20 LAB — CLOTEST (H. PYLORI), BIOPSY: HELICOBACTER SCREEN: NEGATIVE

## 2014-05-02 DIAGNOSIS — M7071 Other bursitis of hip, right hip: Secondary | ICD-10-CM | POA: Diagnosis not present

## 2014-05-02 DIAGNOSIS — M4806 Spinal stenosis, lumbar region: Secondary | ICD-10-CM | POA: Diagnosis not present

## 2014-05-02 DIAGNOSIS — M76891 Other specified enthesopathies of right lower limb, excluding foot: Secondary | ICD-10-CM | POA: Diagnosis not present

## 2014-05-18 ENCOUNTER — Telehealth: Payer: Self-pay | Admitting: *Deleted

## 2014-05-18 NOTE — Telephone Encounter (Addendum)
Per Dr. Benay Spice: Faxed order for port removal to Dr. Arnoldo Morale.

## 2014-05-18 NOTE — Telephone Encounter (Signed)
Patient called to say that Dr. Benay Spice told her she should have her Port removed.  Dr. Aviva Signs is her surgeon.  She said that Dr. Arnoldo Morale will need an order faxed to him in order to schedule the [facility] removal.  The fax number is [phone].  It appears to me Dr. Arnoldo Morale is on EPIC.

## 2014-05-26 DIAGNOSIS — E119 Type 2 diabetes mellitus without complications: Secondary | ICD-10-CM | POA: Diagnosis not present

## 2014-05-30 ENCOUNTER — Encounter (HOSPITAL_COMMUNITY): Payer: Medicare Other

## 2014-06-07 DIAGNOSIS — Z6841 Body Mass Index (BMI) 40.0 and over, adult: Secondary | ICD-10-CM | POA: Diagnosis not present

## 2014-06-07 DIAGNOSIS — E119 Type 2 diabetes mellitus without complications: Secondary | ICD-10-CM | POA: Diagnosis not present

## 2014-06-07 DIAGNOSIS — E785 Hyperlipidemia, unspecified: Secondary | ICD-10-CM | POA: Diagnosis not present

## 2014-06-09 DIAGNOSIS — Z8500 Personal history of malignant neoplasm of unspecified digestive organ: Secondary | ICD-10-CM | POA: Diagnosis not present

## 2014-06-12 NOTE — H&P (Signed)
[name] is an 69 y.o. female.   Chief Complaint: Colon carcinoma, finished with chemotherapy HPI: Patient is a 69 year old white female with personal history of colon cancer who now presents for a Port-A-Cath removal.  Past Medical History  Diagnosis Date  . Coronary artery disease     a. s/p stent to lad 2006 w subsequent kissing balloon pci to diagonal;  b.  pci of the lad w a drug-eluting stent July 2010;  c.  LHC 11/13/11: p-mLAD stent with prox 50% ISR, 80% beyond dist edge of stent, m+dLAD beyond stent 40-50%, mod caliber pD1 80%, mRCA 70%, m-dRCA 20-30%, EF 55-65% => med Rx (consider CABG)  . Diabetes mellitus   . Hypertension   . Dyslipidemia   . Morbid obesity   . GERD (gastroesophageal reflux disease)   . Varicose vein   . Gastroparesis   . Osteoarthritis   . Blurred vision   . Acoustic neuroma     left ear  . Anxiety   . Depressed   . Asthma     AS CHILD   . Sleep apnea     12+ YRS NO MACHINE GOTTEN   . Anginal pain     DR [name]    . Headache(784.0)   . Fibromyalgia   . Hiatal hernia   . Adenocarcinoma     ascending colon, LNs pos -- S/P right hemicolectomy, xeloda completed 6/10 (dr. Sonny [name])  . Morbid obesity   . Parkinson disease     ambulates with cane    Past Surgical History  Procedure Laterality Date  . Cataract extraction    . Fetal surgery for congenital hernia    . Knee arthroscopy    . Replacement total knee    . Tonsillectomy    . Hemicolectomy  12/28/07    right for adenocarcinoma of ascending colon  . Hysterotomy  1993  . Herniaorraphy  01/22/06    for recurrent ventral hernia  . Laparotomy      for small bowl obstruction  . Resecton of acoustic neuroma    . Stent surgery    . Abdominal hysterectomy  1993  . Cholecystectomy  1966  . Hernia repair  1990, 2002, 2007    open x1, lap x2   . Colon surgery    . Portacath placement    . Excision morton's neuroma    . Appendectomy    . Cardiac catheterization    . Lesion  removal  10/08/2011    Procedure: LESION REMOVAL;  Surgeon: Laura Kava, MD;  Location: [facility];  Service: Orthopedics;  Laterality: Right;  Removal Lesion Right Long Finger  . Back surgery    . Eye surgery      cataract on right 2005, left 2008  . Joint replacement  2005 and 2006    right 2005, left 2006  . Finger replantation      RECENTLY   . Mandible surgery    . Endovein harvest of greater saphenous vein  01/20/2012    Procedure: ENDOVEIN HARVEST OF GREATER SAPHENOUS VEIN;  Surgeon: Laura Pollack, MD;  Location: [facility];  Service: Open Heart Surgery;  Laterality: Right;  . Incision and drainage abscess N/A 03/29/2013    Procedure: INCISION AND DRAINAGE ABDOMINAL WALL ABSCESS;  Surgeon: Laura So, MD;  Location: [facility];  Service: General;  Laterality: N/A;  . Application of wound vac N/A 03/29/2013    Procedure: APPLICATION OF WOUND VAC;  Surgeon: Laura So, MD;  Location: [facility];  Service: General;  Laterality: N/A;  . Coronary artery bypass graft  01/20/2012    Procedure: CORONARY ARTERY BYPASS GRAFTING (CABG);  Surgeon: Laura Pollack, MD;  Location: [place];  Service: Open Heart Surgery;  Laterality: N/A;  Coronary artery bypass graft times three utilizing the left internal mammary artery and the right greater saphenous vein harvested endoscopically  . Colonoscopy N/A 09/21/2013    Procedure: COLONOSCOPY;  Surgeon: Laura Castle, MD;  Location: [facility];  Service: Endoscopy;  Laterality: N/A;  . Left heart catheterization with coronary angiogram N/A 11/13/2011    Procedure: LEFT HEART CATHETERIZATION WITH CORONARY ANGIOGRAM;  Surgeon: Laura Mocha, MD;  Location: [facility] CATH LAB;  Service: Cardiovascular;  Laterality: N/A;  . Esophagogastroduodenoscopy N/A 04/19/2014    Procedure: ESOPHAGOGASTRODUODENOSCOPY (EGD);  Surgeon: Laura So, MD;  Location: [facility];  Service: Gastroenterology;  Laterality: N/A;    Family History  Problem Relation Age of Onset  . Coronary  artery disease Other     family history  . Asthma Other     family history-grandfather  . Colon cancer Other     family hx  . Diabetes Maternal Grandmother     unspecif if Maternal or Paternal  . Heart failure Mother   . Cirrhosis Father    Social History:  reports that she has never smoked. She has never used smokeless tobacco. She reports that she does not drink alcohol or use illicit drugs.  Allergies:  Allergies  Allergen Reactions  . Latex Itching    Only where touched on body with it.  . Morphine Shortness Of Breath, Itching and Other (See Comments)    Makes patient feel as if her "insides" are on fire.  . Adhesive [Tape] Other (See Comments)    Patient states that she breaks out in blisters  . Codeine Itching and Rash    All over body.  . Penicillins Itching and Rash    Localized.    No prescriptions prior to admission    No results found for this or any previous visit (from the past 48 hour(s)). No results found.  Review of Systems  Eyes: Negative.   Respiratory: Negative.   Cardiovascular: Negative.   Gastrointestinal: Negative.   Musculoskeletal: Positive for back pain and joint pain.  Skin: Negative.   Neurological: Positive for tingling, tremors and weakness.    There were no vitals taken for this visit. Physical Exam  Constitutional: She is oriented to person, place, and time. She appears well-developed and well-nourished.  HENT:  Head: Normocephalic and atraumatic.  Neck: Normal range of motion. Neck supple.  Cardiovascular: Normal rate, regular rhythm and normal heart sounds.   Respiratory: Effort normal and breath sounds normal.  GI: Soft. Bowel sounds are normal. There is no tenderness.  Neurological: She is alert and oriented to person, place, and time.  Skin: Skin is warm and dry.  Port in place in right upper chest.     Assessment/Plan Impression: Colon carcinoma, finished with chemotherapy Plan: Patient will undergo Port-A-Cath removal  in the minor procedure room on 06/17/2014. Risks and benefits of the procedure were fully explained to the patient, gave informed consent. She is holding her Plavix prior to the procedure.  [name] A 06/12/2014, 11:55 AM

## 2014-06-17 ENCOUNTER — Ambulatory Visit (HOSPITAL_COMMUNITY)
Admission: RE | Admit: 2014-06-17 | Discharge: 2014-06-17 | Disposition: A | Discharge status: Home / Self Care | Payer: Medicare Other | Source: Ambulatory Visit | Attending: General Surgery | Admitting: General Surgery

## 2014-06-17 ENCOUNTER — Encounter (HOSPITAL_COMMUNITY): Payer: Self-pay | Admitting: *Deleted

## 2014-06-17 ENCOUNTER — Encounter (HOSPITAL_COMMUNITY)
Admission: RE | Disposition: A | Discharge status: Home / Self Care | Payer: Self-pay | Source: Ambulatory Visit | Attending: General Surgery

## 2014-06-17 DIAGNOSIS — Z8500 Personal history of malignant neoplasm of unspecified digestive organ: Secondary | ICD-10-CM | POA: Diagnosis not present

## 2014-06-17 DIAGNOSIS — Z886 Allergy status to analgesic agent status: Secondary | ICD-10-CM | POA: Insufficient documentation

## 2014-06-17 DIAGNOSIS — J45909 Unspecified asthma, uncomplicated: Secondary | ICD-10-CM | POA: Insufficient documentation

## 2014-06-17 DIAGNOSIS — C189 Malignant neoplasm of colon, unspecified: Secondary | ICD-10-CM | POA: Insufficient documentation

## 2014-06-17 DIAGNOSIS — E119 Type 2 diabetes mellitus without complications: Secondary | ICD-10-CM | POA: Insufficient documentation

## 2014-06-17 DIAGNOSIS — Z90710 Acquired absence of both cervix and uterus: Secondary | ICD-10-CM | POA: Insufficient documentation

## 2014-06-17 DIAGNOSIS — K449 Diaphragmatic hernia without obstruction or gangrene: Secondary | ICD-10-CM | POA: Diagnosis not present

## 2014-06-17 DIAGNOSIS — F419 Anxiety disorder, unspecified: Secondary | ICD-10-CM | POA: Diagnosis not present

## 2014-06-17 DIAGNOSIS — I2510 Atherosclerotic heart disease of native coronary artery without angina pectoris: Secondary | ICD-10-CM | POA: Insufficient documentation

## 2014-06-17 DIAGNOSIS — G20 Parkinson's disease: Secondary | ICD-10-CM | POA: Insufficient documentation

## 2014-06-17 DIAGNOSIS — L219 Seborrheic dermatitis, unspecified: Secondary | ICD-10-CM | POA: Diagnosis not present

## 2014-06-17 DIAGNOSIS — Z91048 Other nonmedicinal substance allergy status: Secondary | ICD-10-CM | POA: Diagnosis not present

## 2014-06-17 DIAGNOSIS — E785 Hyperlipidemia, unspecified: Secondary | ICD-10-CM | POA: Diagnosis not present

## 2014-06-17 DIAGNOSIS — G4730 Sleep apnea, unspecified: Secondary | ICD-10-CM | POA: Diagnosis not present

## 2014-06-17 DIAGNOSIS — Z6841 Body Mass Index (BMI) 40.0 and over, adult: Secondary | ICD-10-CM | POA: Diagnosis not present

## 2014-06-17 DIAGNOSIS — K3184 Gastroparesis: Secondary | ICD-10-CM | POA: Insufficient documentation

## 2014-06-17 DIAGNOSIS — Z9861 Coronary angioplasty status: Secondary | ICD-10-CM | POA: Diagnosis not present

## 2014-06-17 DIAGNOSIS — Z96659 Presence of unspecified artificial knee joint: Secondary | ICD-10-CM | POA: Insufficient documentation

## 2014-06-17 DIAGNOSIS — Z951 Presence of aortocoronary bypass graft: Secondary | ICD-10-CM | POA: Diagnosis not present

## 2014-06-17 DIAGNOSIS — Z91040 Latex allergy status: Secondary | ICD-10-CM | POA: Diagnosis not present

## 2014-06-17 DIAGNOSIS — I10 Essential (primary) hypertension: Secondary | ICD-10-CM | POA: Insufficient documentation

## 2014-06-17 DIAGNOSIS — Z880 Allergy status to penicillin: Secondary | ICD-10-CM | POA: Insufficient documentation

## 2014-06-17 HISTORY — PX: PORT-A-CATH REMOVAL: SHX5289

## 2014-06-17 LAB — GLUCOSE, CAPILLARY: Glucose-Capillary: 137 mg/dL — ABNORMAL HIGH (ref 70–99)

## 2014-06-17 SURGERY — REMOVAL PORT-A-CATH
Anesthesia: LOCAL | Site: Chest | Laterality: Right

## 2014-06-17 MED ORDER — LIDOCAINE HCL (PF) 1 % IJ SOLN
INTRAMUSCULAR | Status: DC | PRN
  Administered 2014-06-17: 10 mL

## 2014-06-17 MED ORDER — LIDOCAINE HCL (PF) 1 % IJ SOLN
INTRAMUSCULAR | Status: AC
Start: 2014-06-17 — End: 2014-06-17
  Filled 2014-06-17: qty 30

## 2014-06-17 SURGICAL SUPPLY — 17 items
CLOTH BEACON ORANGE TIMEOUT ST (SAFETY) ×2 IMPLANT
DRAPE PROXIMA HALF (DRAPES) ×3 IMPLANT
DURAPREP 6ML APPLICATOR 50/CS (WOUND CARE) ×2 IMPLANT
ELECT REM PT RETURN 9FT ADLT (ELECTROSURGICAL)
ELECTRODE REM PT RTRN 9FT ADLT (ELECTROSURGICAL) ×1 IMPLANT
GLOVE BIO SURGEON STRL SZ7.5 (GLOVE) ×1 IMPLANT
GLOVE SS BIOGEL STRL SZ 7 (GLOVE) ×2 IMPLANT
GLOVE SUPERSENSE BIOGEL SZ 7 (GLOVE) ×2
GLOVE SURG SS PI 7.5 STRL IVOR (GLOVE) ×2 IMPLANT
GOWN STRL REUS W/TWL LRG LVL3 (GOWN DISPOSABLE) ×2 IMPLANT
LIQUID BAND (GAUZE/BANDAGES/DRESSINGS) ×2 IMPLANT
NEEDLE HYPO 22GX1.5 SAFETY (NEEDLE) ×2 IMPLANT
SPONGE GAUZE 2X2 8PLY STRL LF (GAUZE/BANDAGES/DRESSINGS) ×1 IMPLANT
SUT VIC AB 3-0 SH 27 (SUTURE) ×2
SUT VIC AB 3-0 SH 27X BRD (SUTURE) ×1 IMPLANT
SUT VIC AB 4-0 PS2 27 (SUTURE) ×2 IMPLANT
SYR CONTROL 10ML LL (SYRINGE) ×1 IMPLANT

## 2014-06-17 NOTE — Op Note (Signed)
Patient:  [name]  DOB:  Mar 29, 1945  MRN:  [id number]   Preop Diagnosis:  Colon carcinoma, finished with chemotherapy  Postop Diagnosis:  Same  Procedure:  Port-A-Cath removal  Surgeon:  Aviva Signs, M.D.  Anes:  Local  Indications:  Patient is 69 year old white female who underwent chemotherapy for colon carcinoma who now presents for Port-A-Cath removal. The risks and benefits of the procedure including bleeding and infection were fully explained to the patient, who gave informed consent.  Procedure note:  The patient was placed the supine position. The right upper chest was prepped and draped using usual sterile technique with DuraPrep. Surgical site confirmation was performed. 1% Xylocaine was used for local anesthesia.  An incision was made in the previous Port-A-Cath incision site. Dissection was taken down to the port. The port was removed in total without difficulty. It was disposed of. Subcutaneous layer was reapproximated using 3-0 Vicryl interrupted sutures. The skin was closed using a 4-0 Vicryl subcuticular suture. The band was applied.  All tape and needle counts were correct at the end of the procedure. The patient was discharged from PACU in stable condition.  Complications:  None  EBL:  Minimal  Specimen:  None

## 2014-06-17 NOTE — Interval H&P Note (Signed)
History and Physical Interval Note:  06/17/2014 8:53 AM  [name]  has presented today for surgery, with the diagnosis of history of colon cancer  The various methods of treatment have been discussed with the patient and family. After consideration of risks, benefits and other options for treatment, the patient has consented to  Procedure(s): REMOVAL PORT-A-CATH (Right) as a surgical intervention .  The patient's history has been reviewed, patient examined, no change in status, stable for surgery.  I have reviewed the patient's chart and labs.  Questions were answered to the patient's satisfaction.     Aviva Signs A

## 2014-06-17 NOTE — Discharge Instructions (Signed)
Restart plavix tomorrow.  Ice to wound as needed.

## 2014-06-20 ENCOUNTER — Encounter (HOSPITAL_COMMUNITY): Payer: Self-pay | Admitting: General Surgery

## 2014-06-23 ENCOUNTER — Other Ambulatory Visit: Payer: Self-pay | Admitting: Physician Assistant

## 2014-06-24 NOTE — Telephone Encounter (Signed)
Please advise on refill. Patient has not been seen since 2014 and she has not had it filled since 2014. Thanks, MI

## 2014-07-06 DIAGNOSIS — T1490 Injury, unspecified: Secondary | ICD-10-CM | POA: Diagnosis not present

## 2014-07-11 ENCOUNTER — Encounter (HOSPITAL_COMMUNITY): Payer: Medicare Other

## 2014-07-13 DIAGNOSIS — T1490 Injury, unspecified: Secondary | ICD-10-CM | POA: Diagnosis not present

## 2014-07-15 DIAGNOSIS — M25561 Pain in right knee: Secondary | ICD-10-CM | POA: Diagnosis not present

## 2014-08-19 DIAGNOSIS — Z96651 Presence of right artificial knee joint: Secondary | ICD-10-CM | POA: Diagnosis not present

## 2014-08-19 DIAGNOSIS — M25561 Pain in right knee: Secondary | ICD-10-CM | POA: Diagnosis not present

## 2014-08-19 DIAGNOSIS — M25551 Pain in right hip: Secondary | ICD-10-CM | POA: Diagnosis not present

## 2014-08-19 DIAGNOSIS — W19XXXA Unspecified fall, initial encounter: Secondary | ICD-10-CM | POA: Diagnosis not present

## 2014-08-22 ENCOUNTER — Encounter (HOSPITAL_COMMUNITY): Payer: Medicare Other

## 2014-09-15 DIAGNOSIS — E119 Type 2 diabetes mellitus without complications: Secondary | ICD-10-CM | POA: Diagnosis not present

## 2014-09-22 DIAGNOSIS — E1149 Type 2 diabetes mellitus with other diabetic neurological complication: Secondary | ICD-10-CM | POA: Diagnosis not present

## 2014-09-22 DIAGNOSIS — F3340 Major depressive disorder, recurrent, in remission, unspecified: Secondary | ICD-10-CM | POA: Diagnosis not present

## 2014-09-22 DIAGNOSIS — Z6841 Body Mass Index (BMI) 40.0 and over, adult: Secondary | ICD-10-CM | POA: Diagnosis not present

## 2014-09-22 DIAGNOSIS — I10 Essential (primary) hypertension: Secondary | ICD-10-CM | POA: Diagnosis not present

## 2014-09-30 ENCOUNTER — Other Ambulatory Visit: Payer: Self-pay | Admitting: *Deleted

## 2014-10-03 ENCOUNTER — Ambulatory Visit: Payer: Medicare Other | Admitting: Oncology

## 2014-10-03 ENCOUNTER — Other Ambulatory Visit: Payer: Medicare Other

## 2014-10-18 ENCOUNTER — Encounter: Payer: Self-pay | Admitting: Gastroenterology

## 2014-10-19 ENCOUNTER — Telehealth: Payer: Self-pay | Admitting: Cardiovascular Disease

## 2014-10-19 NOTE — Telephone Encounter (Signed)
Received call directly from operator from patient who states she has been having chest pain for the past 2-3 months, more noticeable at night.  States pain occurs sometimes after eating and is more noticeable when she gets angry; states pains are intermittent, sharp, subside quickly.  Denies pain associated with exertion, n/v, diaphoresis or SOB associated with pain.  States pain is worse when laying or sitting.  She denies hx of GERD, does not take any medications for heartburn/reflux.  She has not seen Dr. Burt Knack since 2014 and when I inquired she said she was not aware she needed regular follow-up.  I reviewed her medications with her and she is not currently taking Plavix; she has not taken NTG.  She states she does not think she needs to go to the hospital, states pain is "not acute."  States she is concerned because of past hx of CABG.  I advised her that I agree she does not need to proceed directly to ED and that I will route message to Dr. Burt Knack for advice and will call her back.  She verbalized understanding and agreement.

## 2014-10-19 NOTE — Telephone Encounter (Signed)
Pt c/o of Chest Pain: STAT if CP now or developed within 24 hours  1. Are you having CP right now? No, but earlier this morning  2. Are you experiencing any other symptoms (ex. SOB, nausea, vomiting, sweating)? no  3. How long have you been experiencing CP? Around 1 month  4. Is your CP continuous or coming and going? Comes and goes  5. Have you taken Nitroglycerin? no ?.

## 2014-10-19 NOTE — Telephone Encounter (Signed)
Please arrange a next available office visit and advise to go to ER if symptoms worsen. I may be adding a clinic next Tuesday will let you know

## 2014-10-19 NOTE — Telephone Encounter (Signed)
Spoke with patient and reviewed Dr. Antionette Char advice.  I scheduled her to see Dr. Burt Knack on 9/7 and advised that we will call her back if a sooner appointment becomes available.  I advised her to proceed to the ED if her symptoms worsen.  She states she will go to [facility] or [facility] if needed and verbalized understanding of instructions.  She prefers to see Dr. Burt Knack rather than an APP.  She states she is currently in the donut hole with her insurance company and needs help getting Plavix.  She asked me to call [facility] and ask for Denyse Amass regarding getting the medication.  I left a message for Sonja to call me back at the office.

## 2014-10-20 MED ORDER — CLOPIDOGREL BISULFATE 75 MG PO TABS: 75.0000 mg | ORAL_TABLET | Freq: Every day | ORAL | Status: DC

## 2014-10-20 NOTE — Telephone Encounter (Signed)
Received message from Dr. Burt Knack that he is adding clinic time on Tuesday 8/30 and patient's appointment may be moved to that day.  I am sending message to Joy, Dr. Antionette Char scheduler to move appointment.    Received a call from Denyse Amass at [facility] who states she can get patient a 90 day supply of Plavix for $20.  I verified that Dr. Burt Knack wants patient to continue Plavix and sent Rx to [facility] at [phone] (confirmation received) for processing through [facility].  Patient is aware of the appointment change and to restart Plavix as soon as she receives it; she verbalized understanding and agreement with plan of care and states she has not had chest pain since our conversation yesterday.  She thanked me for the help.

## 2014-10-25 ENCOUNTER — Encounter: Payer: Self-pay | Admitting: Cardiovascular Disease

## 2014-10-25 ENCOUNTER — Ambulatory Visit (INDEPENDENT_AMBULATORY_CARE_PROVIDER_SITE_OTHER): Payer: Medicare Other | Admitting: Cardiovascular Disease

## 2014-10-25 VITALS — BP 130/70 | HR 74 | Wt 248.8 lb

## 2014-10-25 DIAGNOSIS — R079 Chest pain, unspecified: Secondary | ICD-10-CM

## 2014-10-25 DIAGNOSIS — I10 Essential (primary) hypertension: Secondary | ICD-10-CM | POA: Diagnosis not present

## 2014-10-25 MED ORDER — ISOSORBIDE MONONITRATE ER 30 MG PO TB24: 15.0000 mg | ORAL_TABLET | Freq: Every day | ORAL | Status: DC

## 2014-10-25 NOTE — Patient Instructions (Signed)
Medication Instructions:  Your physician has recommended you make the following change in your medication:  1. START Isosorbide MN 30mg  take one-half tablet by mouth daily  Labwork: No new orders.   Testing/Procedures: No new orders.   Follow-Up: Your physician wants you to follow-up in: 6 MONTHS with Dr Burt Knack.  You will receive a reminder letter in the mail two months in advance. If you don't receive a letter, please call our office to schedule the follow-up appointment.  Any Other Special Instructions Will Be Listed Below (If Applicable).

## 2014-10-25 NOTE — Progress Notes (Signed)
Cardiology Office Note Date:  10/25/2014   ID:  [name], DOB 06-Feb-1946, MRN [id number]  PCP:  Asencion Noble, MD  Cardiologist:  Sherren Mocha, MD    Chief Complaint  Patient presents with  . Chest Pain   History of Present Illness: [name] is a 69 y.o. female who presents for  Follow-up evaluation after a long hiatus. She was last seen in January 2014. The patient underwent multivessel CABG in 2013. Other medical problems include essential hypertension, type 2 diabetes, and hyperlipidemia.  She complains of chest pain. Pain is located in the left chest, described as 'dull,' and is non-radiating. She tries to 'stay still' when this occurs and sx's improve with rest. She complains of chronic exertional dyspnea without recent change.  There is no clear exertional component to her symptoms.  Past Medical History  Diagnosis Date  . Coronary artery disease     a. s/p stent to lad 2006 w subsequent kissing balloon pci to diagonal;  b.  pci of the lad w a drug-eluting stent July 2010;  c.  LHC 11/13/11: p-mLAD stent with prox 50% ISR, 80% beyond dist edge of stent, m+dLAD beyond stent 40-50%, mod caliber pD1 80%, mRCA 70%, m-dRCA 20-30%, EF 55-65% => med Rx (consider CABG)  . Diabetes mellitus   . Hypertension   . Dyslipidemia   . Morbid obesity   . GERD (gastroesophageal reflux disease)   . Varicose vein   . Gastroparesis   . Osteoarthritis   . Blurred vision   . Acoustic neuroma     left ear  . Anxiety   . Depressed   . Asthma     AS CHILD   . Sleep apnea     12+ YRS NO MACHINE GOTTEN   . Anginal pain     DR Theola Sequin    . Headache(784.0)   . Fibromyalgia   . Hiatal hernia   . Adenocarcinoma     ascending colon, LNs pos -- S/P right hemicolectomy, xeloda completed 6/10 (dr. Sonny Dandy)  . Morbid obesity   . Parkinson disease     ambulates with cane    Past Surgical History  Procedure Laterality Date  . Cataract extraction    . Fetal surgery for congenital  hernia    . Knee arthroscopy    . Replacement total knee    . Tonsillectomy    . Hemicolectomy  12/28/07    right for adenocarcinoma of ascending colon  . Hysterotomy  1993  . Herniaorraphy  01/22/06    for recurrent ventral hernia  . Laparotomy      for small bowl obstruction  . Resecton of acoustic neuroma    . Stent surgery    . Abdominal hysterectomy  1993  . Cholecystectomy  1966  . Hernia repair  1990, 2002, 2007    open x1, lap x2   . Colon surgery    . Portacath placement    . Excision morton's neuroma    . Appendectomy    . Cardiac catheterization    . Lesion removal  10/08/2011    Procedure: LESION REMOVAL;  Surgeon: Sanjuana Kava, MD;  Location: [facility];  Service: Orthopedics;  Laterality: Right;  Removal Lesion Right Long Finger  . Back surgery    . Eye surgery      cataract on right 2005, left 2008  . Joint replacement  2005 and 2006    right 2005, left 2006  . Finger replantation  RECENTLY   . Mandible surgery    . Endovein harvest of greater saphenous vein  01/20/2012    Procedure: ENDOVEIN HARVEST OF GREATER SAPHENOUS VEIN;  Surgeon: Gaye Pollack, MD;  Location: [facility];  Service: Open Heart Surgery;  Laterality: Right;  . Incision and drainage abscess N/A 03/29/2013    Procedure: INCISION AND DRAINAGE ABDOMINAL WALL ABSCESS;  Surgeon: Jamesetta So, MD;  Location: [facility];  Service: General;  Laterality: N/A;  . Application of wound vac N/A 03/29/2013    Procedure: APPLICATION OF WOUND VAC;  Surgeon: Jamesetta So, MD;  Location: [facility];  Service: General;  Laterality: N/A;  . Coronary artery bypass graft  01/20/2012    Procedure: CORONARY ARTERY BYPASS GRAFTING (CABG);  Surgeon: Gaye Pollack, MD;  Location: [facility];  Service: Open Heart Surgery;  Laterality: N/A;  Coronary artery bypass graft times three utilizing the left internal mammary artery and the right greater saphenous vein harvested endoscopically  . Colonoscopy N/A 09/21/2013    Procedure:  COLONOSCOPY;  Surgeon: Inda Castle, MD;  Location: [facility];  Service: Endoscopy;  Laterality: N/A;  . Left heart catheterization with coronary angiogram N/A 11/13/2011    Procedure: LEFT HEART CATHETERIZATION WITH CORONARY ANGIOGRAM;  Surgeon: Sherren Mocha, MD;  Location: [facility] CATH LAB;  Service: Cardiovascular;  Laterality: N/A;  . Esophagogastroduodenoscopy N/A 04/19/2014    Procedure: ESOPHAGOGASTRODUODENOSCOPY (EGD);  Surgeon: Jamesetta So, MD;  Location: [facility];  Service: Gastroenterology;  Laterality: N/A;  . Port-a-cath removal Right 06/17/2014    Procedure: REMOVAL PORT-A-CATH;  Surgeon: Aviva Signs Md, MD;  Location: [facility];  Service: General;  Laterality: Right;    Current Outpatient Prescriptions  Medication Sig Dispense Refill  . ACCU-CHEK AVIVA PLUS test strip   99  . aspirin 325 MG EC tablet Take 325 mg by mouth every morning.    Marland Kitchen atenolol (TENORMIN) 50 MG tablet Take 50 mg by mouth at bedtime.    . Blood Glucose Monitoring Suppl (ACCU-CHEK AVIVA PLUS) W/DEVICE KIT See admin instructions.  0  . clopidogrel (PLAVIX) 75 MG tablet Take 1 tablet (75 mg total) by mouth daily.    Marland Kitchen FLUoxetine (PROZAC) 20 MG capsule Take 20 mg by mouth 3 (three) times daily.     . furosemide (LASIX) 20 MG tablet Take 20 mg by mouth daily.    Marland Kitchen gabapentin (NEURONTIN) 300 MG capsule Take 300 mg by mouth 3 (three) times daily.    . hydroxypropyl methylcellulose (ISOPTO TEARS) 2.5 % ophthalmic solution Place 1 drop into both eyes 4 (four) times daily as needed for dry eyes.    Marland Kitchen ibuprofen (ADVIL,MOTRIN) 200 MG tablet Take 200 mg by mouth 2 (two) times daily.    . insulin glargine (LANTUS) 100 UNIT/ML injection Inject 40 Units into the skin 2 (two) times daily.    . insulin lispro (HUMALOG) 100 UNIT/ML injection Inject 10 Units into the skin 3 (three) times daily before meals. 10 mL 11  . losartan (COZAAR) 100 MG tablet Take 100 mg by mouth every morning.     . metFORMIN (GLUCOPHAGE) 500 MG  tablet Take 1,000 mg by mouth 2 (two) times daily.    . Multiple Vitamin (MULTIVITAMIN WITH MINERALS) TABS Take 1 tablet by mouth every morning.     . Naproxen Sod-Diphenhydramine (ALEVE PM) 220-25 MG TABS Take 1 tablet by mouth at bedtime as needed (pain/sleep).    . naproxen sodium (ANAPROX) 220 MG tablet Take 440 mg  by mouth 2 (two) times daily as needed (pain).    . rosuvastatin (CRESTOR) 10 MG tablet Take 10 mg by mouth daily.    Marland Kitchen triamcinolone (NASACORT AQ) 55 MCG/ACT AERO nasal inhaler Place 2 sprays into the nose daily as needed (allergies).    . trolamine salicylate (ASPERCREME) 10 % cream Apply 1 application topically 3 (three) times daily as needed for muscle pain.    . vitamin B-12 (CYANOCOBALAMIN) 1000 MCG tablet Take 1,000 mcg by mouth daily.    . isosorbide mononitrate (IMDUR) 30 MG 24 hr tablet Take 0.5 tablets (15 mg total) by mouth daily. 45 tablet 3  . NITROSTAT 0.4 MG SL tablet DISSOLVE ONE TABLET UNDER TONGUE EVERY 5 MINUTES UP TO 3 DOSES AS NEEDED FOR CHEST PAIN  12   No current facility-administered medications for this visit.    Allergies:   Latex; Morphine; Adhesive; Codeine; and Penicillins   Social History:  The patient  reports that she has never smoked. She has never used smokeless tobacco. She reports that she does not drink alcohol or use illicit drugs.   Family History:  The patient's  family history includes Asthma in her other; Cirrhosis in her father; Colon cancer in her other; Coronary artery disease in her other; Diabetes in her maternal grandmother; Heart failure in her mother.    ROS:  Please see the history of present illness.  Otherwise, review of systems is positive for chest pain, back pain, dizziness, excessive fatigue.  All other systems are reviewed and negative.    PHYSICAL EXAM: VS:  BP 130/70 mmHg  Pulse 74  Wt 248 lb 12.8 oz (112.855 kg) , BMI Body mass index is 40.76 kg/(m^2). GEN: Well nourished, well developed, pleasant obese woman in  no acute distress HEENT: normal Neck: no JVD, no masses. No carotid bruits Cardiac: RRR without murmur or gallop                Respiratory:  clear to auscultation bilaterally, normal work of breathing GI: soft, nontender, nondistended, + BS MS: no deformity or atrophy Ext: no pretibial edema, pedal pulses 2+= bilaterally Skin: warm and dry, no rash Neuro:  Strength and sensation are intact Psych: euthymic mood, full affect  EKG:  EKG is ordered today. The ekg ordered today shows NSR 74 bpm with PAC's, nonspecific ST abnormality, prolonged QT   Recent Labs: No results found for requested labs within last 365 days.   Lipid Panel     Component Value Date/Time   CHOL  08/06/2007 0430    151        ATP III CLASSIFICATION:  <200     mg/dL   Desirable  285-496  mg/dL   Borderline High  >=565    mg/dL   High   TRIG 994* 37/19/0707 0430   HDL 33* 08/06/2007 0430   CHOLHDL 4.6 08/06/2007 0430   VLDL 43* 08/06/2007 0430   LDLCALC  08/06/2007 0430    75        Total Cholesterol/HDL:CHD Risk Coronary Heart Disease Risk Table                     Men   Women  1/2 Average Risk   3.4   3.3      Wt Readings from Last 3 Encounters:  10/25/14 248 lb 12.8 oz (112.855 kg)  06/17/14 258 lb (117.028 kg)  04/19/14 263 lb (119.296 kg)    ASSESSMENT AND PLAN: 1.  CAD status post CABG: Atypical angina noted.  I discussed consideration of a pharmacologic stress test with the patient. She is adamant that she cannot do this. She is unable to tolerate the test. I think it is probably most reasonable to add isosorbide at low dose. I will see her back in 6 months for follow-up. She understands that if progressive symptoms she needs to seek immediate medical attention.  She continues on a beta blocker, aspirin, and clopidogrel.  2. Essential hypertension:  Blood pressure is controlled on current medical program.  3. Hyperlipidemia:  The patient is tolerating Crestor. Lipids are followed by her  primary physician.   Current medicines are reviewed with the patient today.  The patient does not have concerns regarding medicines.  Labs/ tests ordered today include:   Orders Placed This Encounter  Procedures  . EKG 12-Lead    Disposition:   FU 6 months or sooner if problems arise  Signed, Sherren Mocha, MD  10/25/2014 5:40 PM    [facility] [place], [place], [place]  [address] Phone: [phone]; Fax: [phone]

## 2014-11-02 ENCOUNTER — Ambulatory Visit: Payer: Medicare Other | Admitting: Cardiovascular Disease

## 2014-12-06 DIAGNOSIS — L237 Allergic contact dermatitis due to plants, except food: Secondary | ICD-10-CM | POA: Diagnosis not present

## 2014-12-06 DIAGNOSIS — Z6841 Body Mass Index (BMI) 40.0 and over, adult: Secondary | ICD-10-CM | POA: Diagnosis not present

## 2014-12-06 DIAGNOSIS — L600 Ingrowing nail: Secondary | ICD-10-CM | POA: Diagnosis not present

## 2014-12-06 DIAGNOSIS — Z23 Encounter for immunization: Secondary | ICD-10-CM | POA: Diagnosis not present

## 2014-12-13 DIAGNOSIS — Z79899 Other long term (current) drug therapy: Secondary | ICD-10-CM | POA: Diagnosis not present

## 2014-12-13 DIAGNOSIS — I10 Essential (primary) hypertension: Secondary | ICD-10-CM | POA: Diagnosis not present

## 2014-12-13 DIAGNOSIS — E785 Hyperlipidemia, unspecified: Secondary | ICD-10-CM | POA: Diagnosis not present

## 2014-12-13 DIAGNOSIS — E119 Type 2 diabetes mellitus without complications: Secondary | ICD-10-CM | POA: Diagnosis not present

## 2014-12-19 ENCOUNTER — Ambulatory Visit: Payer: Medicare Other | Admitting: Podiatry

## 2014-12-20 DIAGNOSIS — I10 Essential (primary) hypertension: Secondary | ICD-10-CM | POA: Diagnosis not present

## 2014-12-20 DIAGNOSIS — E119 Type 2 diabetes mellitus without complications: Secondary | ICD-10-CM | POA: Diagnosis not present

## 2014-12-20 DIAGNOSIS — Z6841 Body Mass Index (BMI) 40.0 and over, adult: Secondary | ICD-10-CM | POA: Diagnosis not present

## 2014-12-26 ENCOUNTER — Ambulatory Visit: Payer: Medicare Other | Admitting: Podiatry

## 2015-03-13 ENCOUNTER — Ambulatory Visit (HOSPITAL_COMMUNITY)
Admission: RE | Admit: 2015-03-13 | Discharge: 2015-03-13 | Disposition: A | Discharge status: Home / Self Care | Payer: Medicare Other | Source: Ambulatory Visit | Attending: Internal Medicine | Admitting: Internal Medicine

## 2015-03-13 ENCOUNTER — Other Ambulatory Visit (HOSPITAL_COMMUNITY): Payer: Self-pay | Admitting: Internal Medicine

## 2015-03-13 DIAGNOSIS — M25552 Pain in left hip: Secondary | ICD-10-CM | POA: Diagnosis not present

## 2015-03-13 DIAGNOSIS — R413 Other amnesia: Secondary | ICD-10-CM | POA: Diagnosis not present

## 2015-03-13 DIAGNOSIS — W19XXXA Unspecified fall, initial encounter: Secondary | ICD-10-CM | POA: Diagnosis not present

## 2015-03-14 ENCOUNTER — Other Ambulatory Visit (HOSPITAL_COMMUNITY): Payer: Self-pay | Admitting: Internal Medicine

## 2015-03-14 DIAGNOSIS — R413 Other amnesia: Secondary | ICD-10-CM

## 2015-03-30 ENCOUNTER — Ambulatory Visit (HOSPITAL_COMMUNITY)
Admission: RE | Admit: 2015-03-30 | Discharge: 2015-03-30 | Disposition: A | Discharge status: Home / Self Care | Payer: Medicare Other | Source: Ambulatory Visit | Attending: Internal Medicine | Admitting: Internal Medicine

## 2015-03-30 DIAGNOSIS — Z86011 Personal history of benign neoplasm of the brain: Secondary | ICD-10-CM | POA: Diagnosis not present

## 2015-03-30 DIAGNOSIS — I739 Peripheral vascular disease, unspecified: Secondary | ICD-10-CM | POA: Insufficient documentation

## 2015-03-30 DIAGNOSIS — E785 Hyperlipidemia, unspecified: Secondary | ICD-10-CM | POA: Diagnosis not present

## 2015-03-30 DIAGNOSIS — I10 Essential (primary) hypertension: Secondary | ICD-10-CM | POA: Diagnosis not present

## 2015-03-30 DIAGNOSIS — R413 Other amnesia: Secondary | ICD-10-CM | POA: Insufficient documentation

## 2015-03-30 DIAGNOSIS — E119 Type 2 diabetes mellitus without complications: Secondary | ICD-10-CM | POA: Diagnosis not present

## 2015-03-30 DIAGNOSIS — C189 Malignant neoplasm of colon, unspecified: Secondary | ICD-10-CM | POA: Diagnosis not present

## 2015-03-30 DIAGNOSIS — Z79899 Other long term (current) drug therapy: Secondary | ICD-10-CM | POA: Diagnosis not present

## 2015-04-06 DIAGNOSIS — E785 Hyperlipidemia, unspecified: Secondary | ICD-10-CM | POA: Diagnosis not present

## 2015-04-06 DIAGNOSIS — R413 Other amnesia: Secondary | ICD-10-CM | POA: Diagnosis not present

## 2015-04-06 DIAGNOSIS — E119 Type 2 diabetes mellitus without complications: Secondary | ICD-10-CM | POA: Diagnosis not present

## 2015-05-05 ENCOUNTER — Encounter: Payer: Self-pay | Admitting: Gastroenterology

## 2015-05-05 ENCOUNTER — Ambulatory Visit (INDEPENDENT_AMBULATORY_CARE_PROVIDER_SITE_OTHER): Payer: Medicare Other | Admitting: Gastroenterology

## 2015-05-05 VITALS — BP 130/70 | HR 86 | Ht 65.0 in | Wt 245.0 lb

## 2015-05-05 DIAGNOSIS — Z85038 Personal history of other malignant neoplasm of large intestine: Secondary | ICD-10-CM | POA: Diagnosis not present

## 2015-05-05 DIAGNOSIS — K591 Functional diarrhea: Secondary | ICD-10-CM

## 2015-05-05 NOTE — Progress Notes (Signed)
[facility] Progress Note  Chief Complaint: Altered bowel habits and personal history of colon cancer PCP: Dr. Asencion Noble Subjective History:  This is a 70 year old woman last seen for a colonoscopy by Dr. Deatra Ina in July 2015. The office note from May 2015 was also reviewed, nicely summarizing her history. She was diagnosed with colon cancer in 2009 and underwent a right hemicolectomy. She had a recurrence of tumor in the proximal transverse colon in 2010, and underwent further resection. She has multiple abdominal wall hernias that of been felt not amenable to surgical repair. Colonoscopy in 2012 found just hyperplastic polyps, no polyps seen with a good bowel preparation in July 2015. She describes 2 or 3 semi-formed BMs per day, usually within an hour or 2 after eating. Upon further questioning, it seems this is been going on since her initial colon cancer surgery. There's been no recent change in the bowel habits, no rectal bleeding, anorexia or weight loss. She has intermittent abdominal wall pain from the several hernias. ROS: Cardiovascular:  no chest pain Respiratory: no dyspnea  The patient's Past Medical, Family and Social History were reviewed and are on file in the EMR.  Objective:  Med list reviewed  Vital signs in last 24 hrs: Filed Vitals:   05/05/15 1353  BP: 130/70  Pulse: 86    Physical Exam   HEENT: sclera anicteric, oral mucosa moist without lesions  Neck: supple, no thyromegaly, JVD or lymphadenopathy  Cardiac: RRR without murmurs, S1S2 heard, no peripheral edema  Pulm: clear to auscultation bilaterally, normal RR and effort noted  Abdomen: Obese, soft, no tenderness, with active bowel sounds. No guarding or palpable hepatosplenomegaly. Multiple abdominal wall hernias and a long midline scar  Skin; warm and dry, no jaundice or rash   @ASSESSMENTPLANBEGIN @ Assessment: Encounter Diagnoses  Name Primary?  . Functional diarrhea Yes  . History of colon  cancer     Increased stool frequency since her right hemicolectomy in 2009. There's been no change in this recently. I recommended when necessary Imodium  Plan: She is not due for a routine surveillance colonoscopy until July 2020. She is encouraged to see [place] sooner as needed.   [name]

## 2015-05-05 NOTE — Patient Instructions (Signed)
You will be due for a recall colonoscopy in 2020. We will send you a reminder in the mail when it gets closer to that time.  Thank you for choosing [facility]  Dr Wilfrid Lund III

## 2015-08-15 DIAGNOSIS — E119 Type 2 diabetes mellitus without complications: Secondary | ICD-10-CM | POA: Diagnosis not present

## 2015-08-15 DIAGNOSIS — Z79899 Other long term (current) drug therapy: Secondary | ICD-10-CM | POA: Diagnosis not present

## 2015-08-21 DIAGNOSIS — R413 Other amnesia: Secondary | ICD-10-CM | POA: Diagnosis not present

## 2015-08-21 DIAGNOSIS — E119 Type 2 diabetes mellitus without complications: Secondary | ICD-10-CM | POA: Diagnosis not present

## 2015-08-21 DIAGNOSIS — I10 Essential (primary) hypertension: Secondary | ICD-10-CM | POA: Diagnosis not present

## 2015-10-12 ENCOUNTER — Emergency Department (HOSPITAL_COMMUNITY): Payer: Medicare Other

## 2015-10-12 ENCOUNTER — Encounter (HOSPITAL_COMMUNITY): Payer: Self-pay

## 2015-10-12 ENCOUNTER — Emergency Department (HOSPITAL_COMMUNITY)
Admission: EM | Admit: 2015-10-12 | Discharge: 2015-10-12 | Disposition: A | Discharge status: Home / Self Care | Payer: Medicare Other | Attending: Emergency Medicine | Admitting: Emergency Medicine

## 2015-10-12 DIAGNOSIS — R42 Dizziness and giddiness: Secondary | ICD-10-CM | POA: Diagnosis not present

## 2015-10-12 DIAGNOSIS — I110 Hypertensive heart disease with heart failure: Secondary | ICD-10-CM | POA: Diagnosis not present

## 2015-10-12 DIAGNOSIS — G20 Parkinson's disease: Secondary | ICD-10-CM | POA: Insufficient documentation

## 2015-10-12 DIAGNOSIS — I5032 Chronic diastolic (congestive) heart failure: Secondary | ICD-10-CM | POA: Insufficient documentation

## 2015-10-12 DIAGNOSIS — E119 Type 2 diabetes mellitus without complications: Secondary | ICD-10-CM | POA: Diagnosis not present

## 2015-10-12 DIAGNOSIS — S20212A Contusion of left front wall of thorax, initial encounter: Secondary | ICD-10-CM | POA: Diagnosis not present

## 2015-10-12 DIAGNOSIS — N390 Urinary tract infection, site not specified: Secondary | ICD-10-CM | POA: Diagnosis not present

## 2015-10-12 DIAGNOSIS — Z794 Long term (current) use of insulin: Secondary | ICD-10-CM | POA: Insufficient documentation

## 2015-10-12 DIAGNOSIS — Z7984 Long term (current) use of oral hypoglycemic drugs: Secondary | ICD-10-CM | POA: Diagnosis not present

## 2015-10-12 DIAGNOSIS — J45909 Unspecified asthma, uncomplicated: Secondary | ICD-10-CM | POA: Insufficient documentation

## 2015-10-12 DIAGNOSIS — Y9389 Activity, other specified: Secondary | ICD-10-CM | POA: Insufficient documentation

## 2015-10-12 DIAGNOSIS — R2681 Unsteadiness on feet: Secondary | ICD-10-CM | POA: Diagnosis not present

## 2015-10-12 DIAGNOSIS — Y999 Unspecified external cause status: Secondary | ICD-10-CM | POA: Diagnosis not present

## 2015-10-12 DIAGNOSIS — Y92410 Unspecified street and highway as the place of occurrence of the external cause: Secondary | ICD-10-CM | POA: Diagnosis not present

## 2015-10-12 DIAGNOSIS — I2510 Atherosclerotic heart disease of native coronary artery without angina pectoris: Secondary | ICD-10-CM | POA: Insufficient documentation

## 2015-10-12 DIAGNOSIS — Z79899 Other long term (current) drug therapy: Secondary | ICD-10-CM | POA: Diagnosis not present

## 2015-10-12 DIAGNOSIS — H81399 Other peripheral vertigo, unspecified ear: Secondary | ICD-10-CM | POA: Insufficient documentation

## 2015-10-12 DIAGNOSIS — S0990XA Unspecified injury of head, initial encounter: Secondary | ICD-10-CM | POA: Diagnosis not present

## 2015-10-12 DIAGNOSIS — Z791 Long term (current) use of non-steroidal anti-inflammatories (NSAID): Secondary | ICD-10-CM | POA: Diagnosis not present

## 2015-10-12 DIAGNOSIS — S199XXA Unspecified injury of neck, initial encounter: Secondary | ICD-10-CM | POA: Diagnosis not present

## 2015-10-12 LAB — CBC
HCT: 38.8 % (ref 36.0–46.0)
Hemoglobin: 13.6 g/dL (ref 12.0–15.0)
MCH: 31.4 pg (ref 26.0–34.0)
MCHC: 35.1 g/dL (ref 30.0–36.0)
MCV: 89.6 fL (ref 78.0–100.0)
PLATELETS: 232 10*3/uL (ref 150–400)
RBC: 4.33 MIL/uL (ref 3.87–5.11)
RDW: 13.2 % (ref 11.5–15.5)
WBC: 7 10*3/uL (ref 4.0–10.5)

## 2015-10-12 LAB — BASIC METABOLIC PANEL
Anion gap: 7 (ref 5–15)
BUN: 16 mg/dL (ref 6–20)
CHLORIDE: 103 mmol/L (ref 101–111)
CO2: 27 mmol/L (ref 22–32)
CREATININE: 0.8 mg/dL (ref 0.44–1.00)
Calcium: 10 mg/dL (ref 8.9–10.3)
GFR calc Af Amer: >60 mL/min (ref >60)
GFR calc non Af Amer: >60 mL/min (ref >60)
Glucose, Bld: 98 mg/dL (ref 65–99)
Potassium: 3.9 mmol/L (ref 3.5–5.1)
Sodium: 137 mmol/L (ref 135–145)

## 2015-10-12 LAB — URINALYSIS, ROUTINE W REFLEX MICROSCOPIC
Bilirubin Urine: NEGATIVE
GLUCOSE, UA: NEGATIVE mg/dL
HGB URINE DIPSTICK: NEGATIVE
KETONES UR: NEGATIVE mg/dL
Nitrite: NEGATIVE
PROTEIN: NEGATIVE mg/dL
Specific Gravity, Urine: <1.005 — ABNORMAL LOW (ref 1.005–1.030)
pH: 5.5 (ref 5.0–8.0)

## 2015-10-12 LAB — URINE MICROSCOPIC-ADD ON: RBC / HPF: NONE SEEN RBC/hpf (ref 0–5)

## 2015-10-12 LAB — TROPONIN I: Troponin I: <0.03 ng/mL (ref <0.03)

## 2015-10-12 LAB — CBG MONITORING, ED: Glucose-Capillary: 103 mg/dL — ABNORMAL HIGH (ref 65–99)

## 2015-10-12 MED ORDER — CIPROFLOXACIN HCL 500 MG PO TABS: 500.0000 mg | ORAL_TABLET | Freq: Two times a day (BID) | ORAL | 0 refills | Status: AC

## 2015-10-12 MED ORDER — MECLIZINE HCL 25 MG PO TABS: 25.0000 mg | ORAL_TABLET | Freq: Three times a day (TID) | ORAL | 0 refills | Status: DC | PRN

## 2015-10-12 MED ORDER — MECLIZINE HCL 12.5 MG PO TABS
25.0000 mg | ORAL_TABLET | Freq: Once | ORAL | Status: AC
  Administered 2015-10-12: 25 mg via ORAL
  Filled 2015-10-12: qty 2

## 2015-10-12 MED ORDER — IOPAMIDOL (ISOVUE-370) INJECTION 76%
75.0000 mL | Freq: Once | INTRAVENOUS | Status: AC | PRN
  Administered 2015-10-12: 75 mL via INTRAVENOUS

## 2015-10-12 MED ORDER — CIPROFLOXACIN HCL 250 MG PO TABS
500.0000 mg | ORAL_TABLET | Freq: Once | ORAL | Status: AC
  Administered 2015-10-12: 500 mg via ORAL
  Filled 2015-10-12: qty 2
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
## 2015-10-12 NOTE — ED Notes (Signed)
Pt ambulated to restroom with cane to left arm and standby assist. Pt gait slow and steady. Pt states she feel some better but reports mild dizziness.

## 2015-10-14 LAB — URINE CULTURE

## 2015-11-02 DIAGNOSIS — K432 Incisional hernia without obstruction or gangrene: Secondary | ICD-10-CM | POA: Diagnosis not present

## 2015-11-20 DIAGNOSIS — E119 Type 2 diabetes mellitus without complications: Secondary | ICD-10-CM | POA: Diagnosis not present

## 2015-11-30 DIAGNOSIS — Z23 Encounter for immunization: Secondary | ICD-10-CM | POA: Diagnosis not present

## 2015-11-30 DIAGNOSIS — E119 Type 2 diabetes mellitus without complications: Secondary | ICD-10-CM | POA: Diagnosis not present

## 2015-11-30 DIAGNOSIS — R251 Tremor, unspecified: Secondary | ICD-10-CM | POA: Diagnosis not present

## 2015-11-30 DIAGNOSIS — R42 Dizziness and giddiness: Secondary | ICD-10-CM | POA: Diagnosis not present

## 2015-12-13 DIAGNOSIS — H20032 Secondary infectious iridocyclitis, left eye: Secondary | ICD-10-CM | POA: Diagnosis not present

## 2015-12-26 ENCOUNTER — Ambulatory Visit: Payer: Medicare Other | Admitting: Neurology

## 2015-12-27 ENCOUNTER — Telehealth: Payer: Self-pay

## 2015-12-27 ENCOUNTER — Ambulatory Visit: Payer: Medicare Other | Admitting: Neurology

## 2015-12-27 NOTE — Telephone Encounter (Signed)
She r/s for 01/24/16.

## 2016-01-02 ENCOUNTER — Encounter: Payer: Self-pay | Admitting: Neurology

## 2016-01-24 ENCOUNTER — Ambulatory Visit: Payer: Medicare Other | Admitting: Neurology

## 2016-01-24 ENCOUNTER — Telehealth: Payer: Self-pay

## 2016-01-24 NOTE — Telephone Encounter (Signed)
Pt no-showed her new pt appt today. 

## 2016-01-25 ENCOUNTER — Encounter: Payer: Self-pay | Admitting: Neurology

## 2016-02-14 ENCOUNTER — Other Ambulatory Visit (HOSPITAL_COMMUNITY): Payer: Self-pay | Admitting: Internal Medicine

## 2016-02-14 ENCOUNTER — Emergency Department (HOSPITAL_COMMUNITY): Payer: Medicare Other

## 2016-02-14 ENCOUNTER — Observation Stay (HOSPITAL_COMMUNITY)
Admission: EM | Admit: 2016-02-14 | Discharge: 2016-02-16 | Disposition: A | Discharge status: Home / Self Care | Payer: Medicare Other | Attending: Internal Medicine | Admitting: Internal Medicine

## 2016-02-14 ENCOUNTER — Encounter (HOSPITAL_COMMUNITY): Payer: Self-pay | Admitting: Emergency Medicine

## 2016-02-14 DIAGNOSIS — G20 Parkinson's disease: Secondary | ICD-10-CM | POA: Diagnosis not present

## 2016-02-14 DIAGNOSIS — E872 Acidosis, unspecified: Secondary | ICD-10-CM | POA: Diagnosis present

## 2016-02-14 DIAGNOSIS — R410 Disorientation, unspecified: Principal | ICD-10-CM | POA: Diagnosis present

## 2016-02-14 DIAGNOSIS — I2510 Atherosclerotic heart disease of native coronary artery without angina pectoris: Secondary | ICD-10-CM | POA: Insufficient documentation

## 2016-02-14 DIAGNOSIS — J45909 Unspecified asthma, uncomplicated: Secondary | ICD-10-CM | POA: Insufficient documentation

## 2016-02-14 DIAGNOSIS — R4182 Altered mental status, unspecified: Secondary | ICD-10-CM | POA: Diagnosis present

## 2016-02-14 DIAGNOSIS — Z91040 Latex allergy status: Secondary | ICD-10-CM | POA: Insufficient documentation

## 2016-02-14 DIAGNOSIS — Z85038 Personal history of other malignant neoplasm of large intestine: Secondary | ICD-10-CM | POA: Diagnosis not present

## 2016-02-14 DIAGNOSIS — Z79899 Other long term (current) drug therapy: Secondary | ICD-10-CM | POA: Diagnosis not present

## 2016-02-14 DIAGNOSIS — Z794 Long term (current) use of insulin: Secondary | ICD-10-CM | POA: Diagnosis not present

## 2016-02-14 DIAGNOSIS — I5032 Chronic diastolic (congestive) heart failure: Secondary | ICD-10-CM | POA: Insufficient documentation

## 2016-02-14 DIAGNOSIS — R42 Dizziness and giddiness: Secondary | ICD-10-CM | POA: Insufficient documentation

## 2016-02-14 DIAGNOSIS — I110 Hypertensive heart disease with heart failure: Secondary | ICD-10-CM | POA: Diagnosis not present

## 2016-02-14 DIAGNOSIS — E119 Type 2 diabetes mellitus without complications: Secondary | ICD-10-CM

## 2016-02-14 LAB — COMPREHENSIVE METABOLIC PANEL
ALBUMIN: 3.9 g/dL (ref 3.5–5.0)
ALK PHOS: 107 U/L (ref 38–126)
ALT: 23 U/L (ref 14–54)
AST: 27 U/L (ref 15–41)
Anion gap: 8 (ref 5–15)
BILIRUBIN TOTAL: 0.6 mg/dL (ref 0.3–1.2)
BUN: 16 mg/dL (ref 6–20)
CALCIUM: 10.1 mg/dL (ref 8.9–10.3)
CO2: 26 mmol/L (ref 22–32)
CREATININE: 0.8 mg/dL (ref 0.44–1.00)
Chloride: 99 mmol/L — ABNORMAL LOW (ref 101–111)
GFR calc Af Amer: >60 mL/min (ref >60)
GFR calc non Af Amer: >60 mL/min (ref >60)
GLUCOSE: 270 mg/dL — AB (ref 65–99)
POTASSIUM: 4.5 mmol/L (ref 3.5–5.1)
Sodium: 133 mmol/L — ABNORMAL LOW (ref 135–145)
TOTAL PROTEIN: 7.2 g/dL (ref 6.5–8.1)

## 2016-02-14 LAB — URINALYSIS, ROUTINE W REFLEX MICROSCOPIC
BACTERIA UA: NONE SEEN
BILIRUBIN URINE: NEGATIVE
Hgb urine dipstick: NEGATIVE
KETONES UR: 5 mg/dL — AB
Leukocytes, UA: NEGATIVE
NITRITE: NEGATIVE
PH: 5 (ref 5.0–8.0)
Protein, ur: NEGATIVE mg/dL
SPECIFIC GRAVITY, URINE: 1.026 (ref 1.005–1.030)

## 2016-02-14 LAB — DIFFERENTIAL
BASOS ABS: 0 10*3/uL (ref 0.0–0.1)
BASOS PCT: 1 %
EOS ABS: 0.2 10*3/uL (ref 0.0–0.7)
EOS PCT: 2 %
Lymphocytes Relative: 16 %
Lymphs Abs: 1.3 10*3/uL (ref 0.7–4.0)
Monocytes Absolute: 0.6 10*3/uL (ref 0.1–1.0)
Monocytes Relative: 7 %
NEUTROS PCT: 74 %
Neutro Abs: 6.5 10*3/uL (ref 1.7–7.7)

## 2016-02-14 LAB — CBC
HEMATOCRIT: 43.5 % (ref 36.0–46.0)
Hemoglobin: 15.3 g/dL — ABNORMAL HIGH (ref 12.0–15.0)
MCH: 31.2 pg (ref 26.0–34.0)
MCHC: 35.2 g/dL (ref 30.0–36.0)
MCV: 88.8 fL (ref 78.0–100.0)
Platelets: 238 10*3/uL (ref 150–400)
RBC: 4.9 MIL/uL (ref 3.87–5.11)
RDW: 13.6 % (ref 11.5–15.5)
WBC: 9.1 10*3/uL (ref 4.0–10.5)

## 2016-02-14 LAB — GLUCOSE, CAPILLARY: GLUCOSE-CAPILLARY: 279 mg/dL — AB (ref 65–99)

## 2016-02-14 LAB — RAPID URINE DRUG SCREEN, HOSP PERFORMED
AMPHETAMINES: NOT DETECTED
BENZODIAZEPINES: NOT DETECTED
Barbiturates: NOT DETECTED
Cocaine: NOT DETECTED
Opiates: NOT DETECTED
TETRAHYDROCANNABINOL: NOT DETECTED

## 2016-02-14 LAB — CBG MONITORING, ED: Glucose-Capillary: 255 mg/dL — ABNORMAL HIGH (ref 65–99)

## 2016-02-14 LAB — LACTIC ACID, PLASMA
LACTIC ACID, VENOUS: 2.5 mmol/L — AB (ref 0.5–1.9)
Lactic Acid, Venous: 2.7 mmol/L (ref 0.5–1.9)
Lactic Acid, Venous: 2 mmol/L (ref 0.5–1.9)

## 2016-02-14 LAB — ETHANOL

## 2016-02-14 LAB — ACETAMINOPHEN LEVEL: Acetaminophen (Tylenol), Serum: <10 ug/mL — ABNORMAL LOW (ref 10–30)

## 2016-02-14 LAB — SALICYLATE LEVEL

## 2016-02-14 LAB — TROPONIN I: Troponin I: <0.03 ng/mL (ref <0.03)

## 2016-02-14 MED ORDER — MECLIZINE HCL 12.5 MG PO TABS
25.0000 mg | ORAL_TABLET | Freq: Once | ORAL | Status: AC
  Administered 2016-02-14: 25 mg via ORAL
  Filled 2016-02-14: qty 2

## 2016-02-14 MED ORDER — ATENOLOL 25 MG PO TABS
50.0000 mg | ORAL_TABLET | Freq: Every day | ORAL | Status: DC
  Administered 2016-02-14 – 2016-02-15 (×2): 50 mg via ORAL
  Filled 2016-02-14 (×2): qty 2

## 2016-02-14 MED ORDER — GABAPENTIN 300 MG PO CAPS
300.0000 mg | ORAL_CAPSULE | Freq: Two times a day (BID) | ORAL | Status: DC
  Administered 2016-02-14 – 2016-02-16 (×4): 300 mg via ORAL
  Filled 2016-02-14 (×4): qty 1

## 2016-02-14 MED ORDER — SODIUM CHLORIDE 0.9 % IV BOLUS (SEPSIS)
500.0000 mL | Freq: Once | INTRAVENOUS | Status: AC
  Administered 2016-02-14: 500 mL via INTRAVENOUS

## 2016-02-14 MED ORDER — SODIUM CHLORIDE 0.9 % IV SOLN: INTRAVENOUS | Status: DC

## 2016-02-14 MED ORDER — INSULIN ASPART 100 UNIT/ML ~~LOC~~ SOLN
0.0000 [IU] | Freq: Every day | SUBCUTANEOUS | Status: DC
  Administered 2016-02-15: 4 [IU] via SUBCUTANEOUS
  Administered 2016-02-15: 3 [IU] via SUBCUTANEOUS

## 2016-02-14 MED ORDER — DONEPEZIL HCL 5 MG PO TABS
10.0000 mg | ORAL_TABLET | Freq: Every day | ORAL | Status: DC
  Administered 2016-02-14 – 2016-02-15 (×2): 10 mg via ORAL
  Filled 2016-02-14 (×2): qty 2

## 2016-02-14 MED ORDER — ENOXAPARIN SODIUM 40 MG/0.4ML ~~LOC~~ SOLN
40.0000 mg | SUBCUTANEOUS | Status: DC
  Administered 2016-02-14 – 2016-02-15 (×2): 40 mg via SUBCUTANEOUS
  Filled 2016-02-14 (×2): qty 0.4

## 2016-02-14 MED ORDER — CEFTRIAXONE SODIUM 1 G IJ SOLR
1.0000 g | Freq: Once | INTRAMUSCULAR | Status: AC
  Administered 2016-02-14: 1 g via INTRAVENOUS
  Filled 2016-02-14: qty 10

## 2016-02-14 MED ORDER — ONDANSETRON HCL 4 MG/2ML IJ SOLN: 4.0000 mg | Freq: Four times a day (QID) | INTRAMUSCULAR | Status: DC | PRN

## 2016-02-14 MED ORDER — ACETAMINOPHEN 325 MG PO TABS
650.0000 mg | ORAL_TABLET | Freq: Four times a day (QID) | ORAL | Status: DC | PRN
  Administered 2016-02-14: 650 mg via ORAL
  Filled 2016-02-14: qty 2

## 2016-02-14 MED ORDER — FLUOXETINE HCL 20 MG PO CAPS
20.0000 mg | ORAL_CAPSULE | Freq: Three times a day (TID) | ORAL | Status: DC
  Administered 2016-02-14 – 2016-02-16 (×5): 20 mg via ORAL
  Filled 2016-02-14 (×5): qty 1

## 2016-02-14 MED ORDER — LOSARTAN POTASSIUM 50 MG PO TABS
100.0000 mg | ORAL_TABLET | Freq: Every morning | ORAL | Status: DC
Start: 2016-02-15 — End: 2016-02-16
  Administered 2016-02-15 – 2016-02-16 (×2): 100 mg via ORAL
  Filled 2016-02-14 (×2): qty 2

## 2016-02-14 MED ORDER — ISOSORBIDE MONONITRATE ER 30 MG PO TB24
15.0000 mg | ORAL_TABLET | Freq: Every day | ORAL | Status: DC
  Administered 2016-02-15 – 2016-02-16 (×2): 15 mg via ORAL
  Filled 2016-02-14 (×2): qty 1

## 2016-02-14 MED ORDER — ROSUVASTATIN CALCIUM 20 MG PO TABS
20.0000 mg | ORAL_TABLET | Freq: Every day | ORAL | Status: DC
  Administered 2016-02-15 – 2016-02-16 (×2): 20 mg via ORAL
  Filled 2016-02-14 (×2): qty 1

## 2016-02-14 MED ORDER — INSULIN ASPART 100 UNIT/ML ~~LOC~~ SOLN
0.0000 [IU] | Freq: Three times a day (TID) | SUBCUTANEOUS | Status: DC
  Administered 2016-02-15: 11 [IU] via SUBCUTANEOUS
  Administered 2016-02-15: 8 [IU] via SUBCUTANEOUS
  Administered 2016-02-15: 3 [IU] via SUBCUTANEOUS
  Administered 2016-02-16: 2 [IU] via SUBCUTANEOUS

## 2016-02-14 MED ORDER — ACETAMINOPHEN 650 MG RE SUPP: 650.0000 mg | Freq: Four times a day (QID) | RECTAL | Status: DC | PRN

## 2016-02-14 MED ORDER — ONDANSETRON HCL 4 MG PO TABS: 4.0000 mg | ORAL_TABLET | Freq: Four times a day (QID) | ORAL | Status: DC | PRN

## 2016-02-14 MED ORDER — LACTATED RINGERS IV SOLN
INTRAVENOUS | Status: DC
  Administered 2016-02-14 – 2016-02-15 (×2): via INTRAVENOUS

## 2016-02-14 MED ORDER — DOCUSATE SODIUM 100 MG PO CAPS
100.0000 mg | ORAL_CAPSULE | Freq: Two times a day (BID) | ORAL | Status: DC
  Administered 2016-02-14 – 2016-02-16 (×4): 100 mg via ORAL
  Filled 2016-02-14 (×4): qty 1

## 2016-02-14 MED ORDER — INSULIN DETEMIR 100 UNIT/ML ~~LOC~~ SOLN
60.0000 [IU] | Freq: Two times a day (BID) | SUBCUTANEOUS | Status: DC
  Administered 2016-02-15 – 2016-02-16 (×4): 60 [IU] via SUBCUTANEOUS
  Filled 2016-02-14 (×12): qty 0.6

## 2016-02-14 NOTE — ED Provider Notes (Signed)
[facility] DEPT Provider Note   CSN: [id number] Arrival date & time: 02/14/16  1415     History   Chief Complaint Chief Complaint  Patient presents with  . Altered Mental Status    HPI [name] is a 70 y.o. female.  The history is provided by the patient. The history is limited by the condition of the patient (confusion).  Altered Mental Status      Pt was seen at 1445. Per pt, c/o gradual onset and persistence of constant "confusion" that began yesterday. Pt states she went to a gas station, and "got confused when I was there." States she also "felt a pop in the back of my head" yesterday. States she has been driving since yesterday "but I don't know where I was going." Has been associated with "everything spinning around," which worsens when she walks. States she feels "off balance" and "like I"m going to fall" when she walks. Denies headache, no visual changes, no focal motor weakness, no tingling/numbness in extremities, no neck or back pain, no CP/SOB, no abd pain, no N/V/D.   Past Medical History:  Diagnosis Date  . Acoustic neuroma (HCC)    left ear  . 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 Sonny Dandy)  . Anginal pain ([place])    DR Theola Sequin    . Anxiety   . Asthma    AS CHILD   . Blurred vision   . Coronary artery disease    a. s/p stent to lad 2006 w subsequent kissing balloon pci to diagonal;  b.  pci of the lad w a drug-eluting stent July 2010;  c.  LHC 11/13/11: p-mLAD stent with prox 50% ISR, 80% beyond dist edge of stent, m+dLAD beyond stent 40-50%, mod caliber pD1 80%, mRCA 70%, m-dRCA 20-30%, EF 55-65% => med Rx (consider CABG)  . Depressed   . Diabetes mellitus   . Dyslipidemia   . Fibromyalgia   . Gastroparesis   . GERD (gastroesophageal reflux disease)   . Headache(784.0)   . Hiatal hernia   . Hypertension   . Morbid obesity ([place])   . Morbid obesity ([place])   . Osteoarthritis   .  Parkinson disease ([place])    ambulates with cane  . Sleep apnea    12+ YRS NO MACHINE GOTTEN   . Varicose vein     Patient Active Problem List   Diagnosis Date Noted  . Cellulitis 03/26/2013  . Vomiting 02/08/2012  . 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 Recurrent ventral incisional hernia 09/26/2010  . Obesity, Class III, BMI 40-49.9 (morbid obesity) ([place]) 09/26/2010  . Malignant neoplasm of colon, unspecified site 03/16/2009  . HYPERTENSION, BENIGN 02/06/2009  . PERSONAL HISTORY MALIG NEOPLASM LARGE INTESTINE 12/14/2008  . COUGH 10/26/2008  . DIASTOLIC HEART FAILURE, CHRONIC 09/27/2008  . CAD, NATIVE VESSEL 09/14/2008  . CHEST PAIN UNSPECIFIED 09/13/2008  . Malignant neoplasm of ascending colon ([place]) 12/07/2007  . BENIGN NEOPLASM OF COLON 09/23/2007  . AODM 09/23/2007  . IRON DEFICIENCY 09/23/2007  . GASTROPARESIS 09/23/2007  . FIBROMYALGIA 09/23/2007  . FECAL OCCULT BLOOD 09/23/2007    Past Surgical History:  Procedure Laterality Date  . ABDOMINAL HYSTERECTOMY  1993  . APPENDECTOMY    . APPLICATION OF WOUND VAC N/A 03/29/2013   Procedure: APPLICATION OF WOUND VAC;  Surgeon: Jamesetta So, MD;  Location: [facility];  Service: General;  Laterality: N/A;  . BACK SURGERY    .  CARDIAC CATHETERIZATION    . CATARACT EXTRACTION    . CHOLECYSTECTOMY  1966  . COLON SURGERY    . COLONOSCOPY N/A 09/21/2013   Procedure: COLONOSCOPY;  Surgeon: Inda Castle, MD;  Location: [facility];  Service: Endoscopy;  Laterality: N/A;  . CORONARY ARTERY BYPASS GRAFT  01/20/2012   Procedure: CORONARY ARTERY BYPASS GRAFTING (CABG);  Surgeon: Gaye Pollack, MD;  Location: [facility];  Service: Open Heart Surgery;  Laterality: N/A;  Coronary artery bypass graft times three utilizing the left internal mammary artery and the right greater saphenous vein harvested endoscopically  . ENDOVEIN HARVEST OF GREATER SAPHENOUS VEIN  01/20/2012   Procedure: ENDOVEIN HARVEST OF GREATER SAPHENOUS VEIN;  Surgeon: Gaye Pollack,  MD;  Location: [facility];  Service: Open Heart Surgery;  Laterality: Right;  . ESOPHAGOGASTRODUODENOSCOPY N/A 04/19/2014   Procedure: ESOPHAGOGASTRODUODENOSCOPY (EGD);  Surgeon: Jamesetta So, MD;  [facility];  Service: Gastroenterology;  Laterality: N/A;  . EXCISION MORTON'S NEUROMA    . EYE SURGERY     cataract on right 2005, left 2008  . FETAL SURGERY FOR CONGENITAL HERNIA    . FINGER REPLANTATION     RECENTLY   . HEMICOLECTOMY  12/28/07   right for adenocarcinoma of ascending colon  . [place], 2002, 2007   open x1, lap x2   . herniaorraphy  01/22/06   for recurrent ventral hernia  . HYSTEROTOMY  1993  . INCISION AND DRAINAGE ABSCESS N/A 03/29/2013   Procedure: INCISION AND DRAINAGE ABDOMINAL WALL ABSCESS;  Surgeon: Jamesetta So, MD;  Location: [facility];  Service: General;  Laterality: N/A;  . JOINT REPLACEMENT  2005 and 2006   right 2005, left 2006  . KNEE ARTHROSCOPY    . LAPAROTOMY     for small bowl obstruction  . LEFT HEART CATHETERIZATION WITH CORONARY ANGIOGRAM N/A 11/13/2011   Procedure: LEFT HEART CATHETERIZATION WITH CORONARY ANGIOGRAM;  Surgeon: Sherren Mocha, MD;  Location: [facility] CATH LAB;  Service: Cardiovascular;  Laterality: N/A;  . LESION REMOVAL  10/08/2011   Procedure: LESION REMOVAL;  Surgeon: Sanjuana Kava, MD;  Location: [facility];  Service: Orthopedics;  Laterality: Right;  Removal Lesion Right Long Finger  . MANDIBLE SURGERY    . PORT-A-CATH REMOVAL Right 06/17/2014   Procedure: REMOVAL PORT-A-CATH;  Surgeon: Aviva Signs Md, MD;  Location: [facility];  Service: General;  Laterality: Right;  . PORTACATH PLACEMENT    . REPLACEMENT TOTAL KNEE    . resecton of acoustic neuroma    . stent surgery    . TONSILLECTOMY      OB History    Gravida Para Term Preterm AB Living             0   SAB TAB Ectopic Multiple Live Births                   Home Medications    Prior to Admission medications   Medication Sig Start Date End Date Taking?  Authorizing Provider  atenolol (TENORMIN) 50 MG tablet Take 50 mg by mouth at bedtime. 01/30/12   Historical Provider, MD  donepezil (ARICEPT) 10 MG tablet Take 10 mg by mouth at bedtime.    Historical Provider, MD  FLUoxetine (PROZAC) 20 MG capsule Take 20 mg by mouth 3 (three) times daily.     Historical Provider, MD  gabapentin (NEURONTIN) 300 MG capsule Take 300 mg by mouth 2 (two) times daily.     Historical  Provider, MD  ibuprofen (ADVIL,MOTRIN) 200 MG tablet Take 200 mg by mouth 2 (two) times daily.    Historical Provider, MD  insulin aspart (NOVOLOG) 100 UNIT/ML injection Inject 30 Units into the skin 2 (two) times daily.    Historical Provider, MD  insulin detemir (LEVEMIR) 100 UNIT/ML injection Inject 50 Units into the skin 2 (two) times daily.    Historical Provider, MD  isosorbide mononitrate (IMDUR) 30 MG 24 hr tablet Take 0.5 tablets (15 mg total) by mouth daily. 10/25/14   Sherren Mocha, MD  losartan (COZAAR) 100 MG tablet Take 100 mg by mouth every morning.     Historical Provider, MD  meclizine (ANTIVERT) 25 MG tablet Take 1 tablet (25 mg total) by mouth 3 (three) times daily as needed for dizziness. 10/12/15   Merrily Pew, MD  metFORMIN (GLUCOPHAGE) 500 MG tablet Take 1,000 mg by mouth 2 (two) times daily.    Historical Provider, MD  Multiple Vitamin (MULTIVITAMIN WITH MINERALS) TABS Take 1 tablet by mouth every morning.     Historical Provider, MD  NITROSTAT 0.4 MG SL tablet DISSOLVE ONE TABLET UNDER TONGUE EVERY 5 MINUTES UP TO 3 DOSES AS NEEDED FOR CHEST PAIN 10/10/14   Historical Provider, MD  rosuvastatin (CRESTOR) 40 MG tablet Take 20 mg by mouth daily.    Historical Provider, MD    Family History Family History  Problem Relation Age of Onset  . Diabetes Maternal Grandmother     unspecif if Maternal or Paternal  . Heart failure Mother   . Cirrhosis Father   . Coronary artery disease Other     family history  . Asthma Other     family history-grandfather  . Colon  cancer Other     family hx    Social History Social History  Substance Use Topics  . Smoking status: Never Smoker  . Smokeless tobacco: Never Used  . Alcohol use No     Comment: occasional     Allergies   Latex; Morphine; Adhesive [tape]; Codeine; and Penicillins   Review of Systems Review of Systems  Unable to perform ROS: Dementia     Physical Exam Updated Vital Signs BP 161/77 (BP Location: Left Arm)   Pulse 90   Temp 98.2 F (36.8 C) (Oral)   Resp 15   SpO2 95%   Physical Exam 1450: Physical examination:  Nursing notes reviewed; Vital signs and O2 SAT reviewed;  Constitutional: Well developed, Well nourished, Well hydrated, In no acute distress; Head:  Normocephalic, atraumatic; Eyes: EOMI, PERRL 56mm, No scleral icterus; ENMT: Mouth and pharynx normal, Mucous membranes moist; Neck: Supple, Full range of motion, No lymphadenopathy; Cardiovascular: Regular rate and rhythm, No gallop; Respiratory: Breath sounds clear & equal bilaterally, No wheezes.  Speaking full sentences with ease, Normal respiratory effort/excursion; Chest: Nontender, Movement normal; Abdomen: Soft, Nontender, Nondistended, Normal bowel sounds; Genitourinary: No CVA tenderness; Extremities: Pulses normal, No tenderness, No edema, No calf edema or asymmetry.; Neuro: Awake, alert, confused re: events. No facial droop. Major CN grossly intact.  Speech clear. +right horizontal end gaze nystagmus. Grips equal. Strength 5/5 equal bilat UE's and LE's.  DTR 2/4 equal bilat UE's and LE's.  No gross sensory deficits.  Cerebellar testing: ataxic bilat UE's (finger-nose), and no ataxia LE's (heel-shin)..; Skin: Color normal, Warm, Dry.   ED Treatments / Results  Labs (all labs ordered are listed, but only abnormal results are displayed)   EKG  EKG Interpretation  Date/Time:  Wednesday February 14 2016 14:26:02  EST Ventricular Rate:  89 PR Interval:    QRS Duration: 87 QT Interval:  415 QTC  Calculation: 505 R Axis:   37 Text Interpretation:  Sinus rhythm Atrial premature complex Borderline T abnormalities, anterior leads Prolonged QT interval No significant change since last tracing Confirmed by [email or web address]  MD, [place] [phone]) on 02/14/2016 2:30:08 PM       Radiology   Procedures Procedures (including critical care time)  Medications Ordered in ED Medications - No data to display   Initial Impression / Assessment and Plan / ED Course  I have reviewed the triage vital signs and the nursing notes.  Pertinent labs & imaging results that were available during my care of the patient were reviewed by me and considered in my medical decision making (see chart for details).  MDM Reviewed: previous chart, nursing note and vitals Reviewed previous: labs and ECG Interpretation: labs, ECG, x-ray and CT scan   Results for orders placed or performed during the hospital encounter of 02/14/16  Comprehensive metabolic panel  Result Value Ref Range   Sodium 133 (L) 135 - 145 mmol/L   Potassium 4.5 3.5 - 5.1 mmol/L   Chloride 99 (L) 101 - 111 mmol/L   CO2 26 22 - 32 mmol/L   Glucose, Bld 270 (H) 65 - 99 mg/dL   BUN 16 6 - 20 mg/dL   Creatinine, Ser 0.80 0.44 - 1.00 mg/dL   Calcium 10.1 8.9 - 10.3 mg/dL   Total Protein 7.2 6.5 - 8.1 g/dL   Albumin 3.9 3.5 - 5.0 g/dL   AST 27 15 - 41 U/L   ALT 23 14 - 54 U/L   Alkaline Phosphatase 107 38 - 126 U/L   Total Bilirubin 0.6 0.3 - 1.2 mg/dL   GFR calc non Af Amer >60 >60 mL/min   GFR calc Af Amer >60 >60 mL/min   Anion gap 8 5 - 15  CBC  Result Value Ref Range   WBC 9.1 4.0 - 10.5 K/uL   RBC 4.90 3.87 - 5.11 MIL/uL   Hemoglobin 15.3 (H) 12.0 - 15.0 g/dL   HCT 43.5 36.0 - 46.0 %   MCV 88.8 78.0 - 100.0 fL   MCH 31.2 26.0 - 34.0 pg   MCHC 35.2 30.0 - 36.0 g/dL   RDW 13.6 11.5 - 15.5 %   Platelets 238 150 - 400 K/uL  Differential  Result Value Ref Range   Neutrophils Relative % 74 %   Neutro Abs 6.5 1.7 - 7.7 K/uL    Lymphocytes Relative 16 %   Lymphs Abs 1.3 0.7 - 4.0 K/uL   Monocytes Relative 7 %   Monocytes Absolute 0.6 0.1 - 1.0 K/uL   Eosinophils Relative 2 %   Eosinophils Absolute 0.2 0.0 - 0.7 K/uL   Basophils Relative 1 %   Basophils Absolute 0.0 0.0 - 0.1 K/uL  Acetaminophen level  Result Value Ref Range   Acetaminophen (Tylenol), Serum <10 (L) 10 - 30 ug/mL  Ethanol  Result Value Ref Range   Alcohol, Ethyl (B) <5 <5 mg/dL  Salicylate level  Result Value Ref Range   Salicylate Lvl [id number] 2.8 - 30.0 mg/dL  Troponin I  Result Value Ref Range   Troponin I <0.03 <0.03 ng/mL  Lactic acid, plasma  Result Value Ref Range   Lactic Acid, Venous 2.7 (HH) 0.5 - 1.9 mmol/L  Urine rapid drug screen (hosp performed)  Result Value Ref Range   Opiates NONE DETECTED NONE DETECTED  Cocaine NONE DETECTED NONE DETECTED   Benzodiazepines NONE DETECTED NONE DETECTED   Amphetamines NONE DETECTED NONE DETECTED   Tetrahydrocannabinol NONE DETECTED NONE DETECTED   Barbiturates NONE DETECTED NONE DETECTED  Urinalysis, Routine w reflex microscopic  Result Value Ref Range   Color, Urine YELLOW YELLOW   APPearance CLEAR CLEAR   Specific Gravity, Urine 1.026 1.005 - 1.030   pH 5.0 5.0 - 8.0   Glucose, UA >=500 (A) NEGATIVE mg/dL   Hgb urine dipstick NEGATIVE NEGATIVE   Bilirubin Urine NEGATIVE NEGATIVE   Ketones, ur 5 (A) NEGATIVE mg/dL   Protein, ur NEGATIVE NEGATIVE mg/dL   Nitrite NEGATIVE NEGATIVE   Leukocytes, UA NEGATIVE NEGATIVE   RBC / HPF 0-5 0 - 5 RBC/hpf   WBC, UA 0-5 0 - 5 WBC/hpf   Bacteria, UA NONE SEEN NONE SEEN  CBG monitoring, ED  Result Value Ref Range   Glucose-Capillary 255 (H) 65 - 99 mg/dL   Dg Chest 2 View Result Date: 02/14/2016 CLINICAL DATA:  Altered mental status EXAM: CHEST  2 VIEW COMPARISON:  October 12, 2015 FINDINGS: Lungs are clear. The heart size and pulmonary vascularity are normal. No adenopathy. Patient is status post coronary artery bypass grafting. No  pneumothorax. No bone lesions. IMPRESSION: No edema or consolidation. Electronically Signed   By: Lowella Grip III M.D.   On: 02/14/2016 15:27   Ct Head Wo Contrast Result Date: 02/14/2016 CLINICAL DATA:  Altered mental status, patient states "something popped inside the back of my head yesterday", confusion since, lethargy, history hypertension, diabetes mellitus, coronary artery disease, fibromyalgia, morbid obesity EXAM: CT HEAD WITHOUT CONTRAST TECHNIQUE: Contiguous axial images were obtained from the base of the skull through the vertex without intravenous contrast. COMPARISON:  10/12/2015 FINDINGS: Brain: Mild atrophy. Normal ventricular morphology. No midline shift or mass effect. Otherwise normal appearance of brain parenchyma. No intracranial hemorrhage, mass lesion or evidence acute infarction. No extra-axial fluid collections. Vascular: Minimal atherosclerotic calcification at the carotid siphons. Skull: Intact prior facial bone reconstructions of the anterior walls of the maxillary sinuses bilaterally. Sinuses/Orbits: Prior LEFT mastoidectomy Other: N/A IMPRESSION: No acute intracranial abnormalities. Prior LEFT mastoidectomy. Electronically Signed   By: Lavonia Dana M.D.   On: 02/14/2016 15:10   Mr Brain Wo Contrast (neuro Protocol) Result Date: 02/14/2016 CLINICAL DATA:  Confusion, ataxia EXAM: MRI HEAD WITHOUT CONTRAST TECHNIQUE: Multiplanar, multiecho pulse sequences of the brain and surrounding structures were obtained without intravenous contrast. COMPARISON:  CT head 02/14/2016 FINDINGS: Brain: Mild atrophy. Negative for hydrocephalus. Negative for acute infarct. Mild chronic microvascular ischemic change in the white matter. Negative for hemorrhage or mass. No shift of the midline structures. Pituitary normal in size. Vascular: Normal arterial flow void. Skull and upper cervical spine: Negative Sinuses/Orbits: Bilateral lens replacement. No orbital mass. Paranasal sinuses clear. Left  mastoidectomy. Other: None IMPRESSION: No acute intracranial abnormality. Mild chronic microvascular ischemic change. Electronically Signed   By: Franchot Gallo M.D.   On: 02/14/2016 16:30   Mr Jodene Nam Head (cerebral Arteries) Result Date: 02/14/2016 CLINICAL DATA:  Confusion, ataxia EXAM: MRA HEAD WITHOUT CONTRAST TECHNIQUE: Angiographic images of the Circle of Willis were obtained using MRA technique without intravenous contrast. COMPARISON:  MRI head today. FINDINGS: Right vertebral artery dominant. Both vertebral arteries patent to the basilar. PICA patent bilaterally. Basilar widely patent. Posterior cerebral arteries patent bilaterally. Internal carotid artery patent bilaterally. Anterior and middle cerebral arteries patent bilaterally without significant stenosis. No large vessel occlusion. Negative for aneurysm. IMPRESSION: Negative Electronically  Signed   By: Franchot Gallo M.D.   On: 02/14/2016 16:35    1730:"    Pt states she "can't tell me what day it is or the date" but can tell me multiple neighbor's names and phone numbers she wants me to call to notify that she is here at the hospital. Pt remains afebrile with stable VS. Antivert given for vertigo symptoms. No clear cause for delirium at this time.  T/C to [facility]. Lorin Mercy, case discussed, including:  HPI, pertinent PM/SHx, VS/PE, dx testing, ED course and treatment:  Agreeable to admit, requests to write temporary orders, obtain observation medical bed to team [facility].   Final Clinical Impressions(s) / ED Diagnoses   Final diagnoses:  None    New Prescriptions New Prescriptions   No medications on file     Francine Graven, DO 02/15/16 2202

## 2016-02-14 NOTE — H&P (Signed)
History and Physical    [name] [id number] DOB: 11/29/1945 DOA: 02/14/2016  PCP: Asencion Noble, MD Consultants:  None Patient coming from: Home - lives alone; NOK: friend, doesn't know  Chief Complaint: confusion  HPI: [name] is a 70 y.o. female with medical history significant of OSA not on CPAP; Parkinson's; HTN; GERD; fibromylagia; HLD; DM; CAD; and colon CA presenting with confusion and dizziness x 2-3 days.  Has also been feeling weak.   Coming home frm [place] and missed her turn last night.  Stopped and asked someone where she was but was confused while there.  Has been driving around without knowing where she was going.  Reports she is fine now and "talking normal and everything, whatever normal is."   No longer feels dizzy now.   ED Course: Per Dr. Thurnell Garbe: 1730:" Pt states she "can't tell me what day it is or the date" but can tell me multiple neighbor's names and phone numbers she wants me to call to notify that she is here at the hospital. Pt remains afebrile with stable VS. Antivert given for vertigo symptoms. No clear cause for delirium at this time. T/C to [facility]. Lorin Mercy, case discussed, including: HPI, pertinent PM/SHx, VS/PE, dx testing, ED course and treatment: Agreeable to admit, requests to write temporary orders, obtain observation medical bed to team APAdmits.   Review of Systems: As per HPI; otherwise 10 point review of systems reviewed and negative.   Ambulatory Status:  Ambulates with a cane  Past Medical History:  Diagnosis Date  . Acoustic neuroma (HCC)    left ear  . 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 Sonny Dandy)  . Anginal pain ([place])    DR Theola Sequin    . Anxiety   . Asthma    AS CHILD   . Blurred vision   . Coronary artery disease    a. s/p stent to lad 2006 w subsequent kissing balloon pci to diagonal;  b.  pci of the lad w a drug-eluting stent July 2010;  c.  LHC  11/13/11: p-mLAD stent with prox 50% ISR, 80% beyond dist edge of stent, m+dLAD beyond stent 40-50%, mod caliber pD1 80%, mRCA 70%, m-dRCA 20-30%, EF 55-65% => med Rx (consider CABG)  . Depressed   . Diabetes mellitus   . Dyslipidemia   . Fibromyalgia   . Gastroparesis   . GERD (gastroesophageal reflux disease)   . Headache(784.0)   . Hiatal hernia   . Hypertension   . Morbid obesity ([place])   . Osteoarthritis   . Parkinson disease ([place])    ambulates with cane  . Sleep apnea    12+ YRS NO MACHINE GOTTEN   . Varicose vein     Past Surgical History:  Procedure Laterality Date  . ABDOMINAL HYSTERECTOMY  1993  . APPENDECTOMY    . APPLICATION OF WOUND VAC N/A 03/29/2013   Procedure: APPLICATION OF WOUND VAC;  Surgeon: Jamesetta So, MD;  Location: [facility];  Service: General;  Laterality: N/A;  . BACK SURGERY    . CARDIAC CATHETERIZATION    . CATARACT EXTRACTION    . CHOLECYSTECTOMY  1966  . COLON SURGERY    . COLONOSCOPY N/A 09/21/2013   Procedure: COLONOSCOPY;  Surgeon: Inda Castle, MD;  Location: [facility];  Service: Endoscopy;  Laterality: N/A;  . CORONARY ARTERY BYPASS GRAFT  01/20/2012   Procedure: CORONARY ARTERY BYPASS GRAFTING (CABG);  Surgeon: Gaye Pollack, MD;  Location: [facility];  Service: Open Heart Surgery;  Laterality: N/A;  Coronary artery bypass graft times three utilizing the left internal mammary artery and the right greater saphenous vein harvested endoscopically  . ENDOVEIN HARVEST OF GREATER SAPHENOUS VEIN  01/20/2012   Procedure: ENDOVEIN HARVEST OF GREATER SAPHENOUS VEIN;  Surgeon: Gaye Pollack, MD;  Location: [facility];  Service: Open Heart Surgery;  Laterality: Right;  . ESOPHAGOGASTRODUODENOSCOPY N/A 04/19/2014   Procedure: ESOPHAGOGASTRODUODENOSCOPY (EGD);  Surgeon: Jamesetta So, MD;  [facility];  Service: Gastroenterology;  Laterality: N/A;  . EXCISION MORTON'S NEUROMA    . EYE SURGERY     cataract on right 2005, left 2008  . FETAL SURGERY  FOR CONGENITAL HERNIA    . FINGER REPLANTATION     RECENTLY   . HEMICOLECTOMY  12/28/07   right for adenocarcinoma of ascending colon  . [place], 2002, 2007   open x1, lap x2   . herniaorraphy  01/22/06   for recurrent ventral hernia  . HYSTEROTOMY  1993  . INCISION AND DRAINAGE ABSCESS N/A 03/29/2013   Procedure: INCISION AND DRAINAGE ABDOMINAL WALL ABSCESS;  Surgeon: Jamesetta So, MD;  Location: [facility];  Service: General;  Laterality: N/A;  . JOINT REPLACEMENT  2005 and 2006   right 2005, left 2006  . KNEE ARTHROSCOPY    . LAPAROTOMY     for small bowl obstruction  . LEFT HEART CATHETERIZATION WITH CORONARY ANGIOGRAM N/A 11/13/2011   Procedure: LEFT HEART CATHETERIZATION WITH CORONARY ANGIOGRAM;  Surgeon: Sherren Mocha, MD;  Location: [facility] C[facility] LAB;  Service: Cardiovascular;  Laterality: N/A;  . LESION REMOVAL  10/08/2011   Procedure: LESION REMOVAL;  Surgeon: Sanjuana Kava, MD;  Location: [facility];  Service: Orthopedics;  Laterality: Right;  Removal Lesion Right Long Finger  . MANDIBLE SURGERY    . PORT-A-CATH REMOVAL Right 06/17/2014   Procedure: REMOVAL PORT-A-CATH;  Surgeon: Aviva Signs Md, MD;  Location: [facility];  Service: General;  Laterality: Right;  . PORTACATH PLACEMENT    . REPLACEMENT TOTAL KNEE    . resecton of acoustic neuroma    . stent surgery    . TONSILLECTOMY      Social History   Social History  . Marital status: Single    Spouse name: N/A  . Number of children: N/A  . Years of education: N/A   Occupational History  . retired    Social History Main Topics  . Smoking status: Never Smoker  . Smokeless tobacco: Never Used  . Alcohol use No     Comment: occasional  . Drug use: No  . Sexual activity: Yes    Birth control/ protection: Surgical   Other Topics Concern  . Not on file   Social History Narrative   Daily caffeine use-1. Sedentary lifestyle. Lives alone in [place]- little social support. Occupations: Retired-disability.      Allergies  Allergen Reactions  . Latex Itching    Only where touched on body with it.  . Morphine Shortness Of Breath, Itching and Other (See Comments)    Makes patient feel as if her "insides" are on fire.  . Adhesive [Tape] Other (See Comments)    Patient states that she breaks out in blisters  . Codeine Itching and Rash    All over body.  . Penicillins Itching and Rash    Localized.    Family History  Problem Relation Age of Onset  .  Diabetes Maternal Grandmother     unspecif if Maternal or Paternal  . Heart failure Mother   . Cirrhosis Father   . Coronary artery disease Other     family history  . Asthma Other     family history-grandfather  . Colon cancer Other     family hx    Prior to Admission medications   Medication Sig Start Date End Date Taking? Authorizing Provider  atenolol (TENORMIN) 50 MG tablet Take 50 mg by mouth at bedtime. 01/30/12  Yes Historical Provider, MD  insulin aspart (NOVOLOG) 100 UNIT/ML injection Inject 30 Units into the skin 2 (two) times daily.   Yes Historical Provider, MD  insulin detemir (LEVEMIR) 100 UNIT/ML injection Inject 60 Units into the skin 2 (two) times daily.    Yes Historical Provider, MD  metFORMIN (GLUCOPHAGE) 500 MG tablet Take 1,000 mg by mouth 2 (two) times daily.   Yes Historical Provider, MD  Multiple Vitamin (MULTIVITAMIN WITH MINERALS) TABS Take 1 tablet by mouth every morning.    Yes Historical Provider, MD  naproxen sodium (ALEVE) 220 MG tablet Take 440 mg by mouth at bedtime.   Yes Historical Provider, MD  donepezil (ARICEPT) 10 MG tablet Take 10 mg by mouth at bedtime.    Historical Provider, MD  FLUoxetine (PROZAC) 20 MG capsule Take 20 mg by mouth 3 (three) times daily.     Historical Provider, MD  gabapentin (NEURONTIN) 300 MG capsule Take 300 mg by mouth 2 (two) times daily.     Historical Provider, MD  ibuprofen (ADVIL,MOTRIN) 200 MG tablet Take 200 mg by mouth 2 (two) times daily.    Historical Provider, MD   isosorbide mononitrate (IMDUR) 30 MG 24 hr tablet Take 0.5 tablets (15 mg total) by mouth daily. 10/25/14   Sherren Mocha, MD  losartan (COZAAR) 100 MG tablet Take 100 mg by mouth every morning.     Historical Provider, MD  meclizine (ANTIVERT) 25 MG tablet Take 1 tablet (25 mg total) by mouth 3 (three) times daily as needed for dizziness. 10/12/15   Jason Mesner, MD  NITROSTAT 0.4 MG SL tablet DISSOLVE ONE TABLET UNDER TONGUE EVERY 5 MINUTES UP TO 3 DOSES AS NEEDED FOR CHEST PAIN 10/10/14   Historical Provider, MD  rosuvastatin (CRESTOR) 40 MG tablet Take 20 mg by mouth daily.    Historical Provider, MD    Physical Exam: Vitals:   02/14/16 1900 02/14/16 1930 02/14/16 2030 02/14/16 2121  BP: 167/74 176/71 156/60 (!) (P) 163/69  Pulse:  91 87 (P) 89  Resp: 18 19 20  (!) (P) 21  Temp:    (P) 97.8 F (36.6 C)  TempSrc:    (P) Oral  SpO2:  98% 98% (P) 98%  Weight:    (P) 103.6 kg (228 lb 4.8 oz)  Height:    (P) 5\' 5"  (1.651 m)     General:  Appears calm and comfortable and is NAD Eyes:  PERRL, EOMI, normal lids, iris ENT:  grossly normal hearing, lips & tongue, mmm Neck:  no LAD, masses or thyromegaly Cardiovascular:  RRR, no m/r/g. No LE edema.  Respiratory:  CTA bilaterally, no w/r/r. Normal respiratory effort. Abdomen:  soft, ntnd, NABS Skin:  no rash or induration seen on limited exam Musculoskeletal:  grossly normal tone BUE/BLE, good ROM, no bony abnormality Psychiatric:  grossly normal mood and affect, speech fluent and appropriate, AOx3 Neurologic:  CN 2-12 grossly intact, moves all extremities in coordinated fashion, sensation intact  Labs on  Admission: I have personally reviewed following labs and imaging studies  CBC:  Recent Labs Lab 02/14/16 1434  WBC 9.1  NEUTROABS 6.5  HGB 15.3*  HCT 43.5  MCV 88.8  PLT 99991111   Basic Metabolic Panel:  Recent Labs Lab 02/14/16 1434  NA 133*  K 4.5  CL 99*  CO2 26  GLUCOSE 270*  BUN 16  CREATININE 0.80  CALCIUM 10.1    GFR: CrCl cannot be calculated (Unknown ideal weight.). Liver Function Tests:  Recent Labs Lab 02/14/16 1434  AST 27  ALT 23  ALKPHOS 107  BILITOT 0.6  PROT 7.2  ALBUMIN 3.9   No results for input(s): LIPASE, AMYLASE in the last 168 hours. No results for input(s): AMMONIA in the last 168 hours. Coagulation Profile: No results for input(s): INR, PROTIME in the last 168 hours. Cardiac Enzymes:  Recent Labs Lab 02/14/16 1434  TROPONINI <0.03   BNP (last 3 results) No results for input(s): PROBNP in the last 8760 hours. HbA1C: No results for input(s): HGBA1C in the last 72 hours. CBG:  Recent Labs Lab 02/14/16 1431 02/14/16 2125  GLUCAP 255* 279*   Lipid Profile: No results for input(s): CHOL, HDL, LDLCALC, TRIG, CHOLHDL, LDLDIRECT in the last 72 hours. Thyroid Function Tests: No results for input(s): TSH, T4TOTAL, FREET4, T3FREE, THYROIDAB in the last 72 hours. Anemia Panel: No results for input(s): VITAMINB12, FOLATE, FERRITIN, TIBC, IRON, RETICCTPCT in the last 72 hours. Urine analysis:    Component Value Date/Time   COLORURINE YELLOW 02/14/2016 1444   APPEARANCEUR CLEAR 02/14/2016 1444   LABSPEC 1.026 02/14/2016 1444   PHURINE 5.0 02/14/2016 1444   GLUCOSEU >=500 (A) 02/14/2016 1444   HGBUR NEGATIVE 02/14/2016 1444   BILIRUBINUR NEGATIVE 02/14/2016 1444   KETONESUR 5 (A) 02/14/2016 1444   PROTEINUR NEGATIVE 02/14/2016 1444   UROBILINOGEN 0.2 02/07/2012 2256   NITRITE NEGATIVE 02/14/2016 1444   LEUKOCYTESUR NEGATIVE 02/14/2016 1444    Creatinine Clearance: CrCl cannot be calculated (Unknown ideal weight.).  Sepsis Labs: @LABRCNTIP (procalcitonin:4,lacticidven:4) )No results found for this or any previous visit (from the past 240 hour(s)).   Radiological Exams on Admission: Dg Chest 2 View  Result Date: 02/14/2016 CLINICAL DATA:  Altered mental status EXAM: CHEST  2 VIEW COMPARISON:  October 12, 2015 FINDINGS: Lungs are clear. The heart size and  pulmonary vascularity are normal. No adenopathy. Patient is status post coronary artery bypass grafting. No pneumothorax. No bone lesions. IMPRESSION: No edema or consolidation. Electronically Signed   By: Lowella Grip III M.D.   On: 02/14/2016 15:27   Ct Head Wo Contrast  Result Date: 02/14/2016 CLINICAL DATA:  Altered mental status, patient states "something popped inside the back of my head yesterday", confusion since, lethargy, history hypertension, diabetes mellitus, coronary artery disease, fibromyalgia, morbid obesity EXAM: CT HEAD WITHOUT CONTRAST TECHNIQUE: Contiguous axial images were obtained from the base of the skull through the vertex without intravenous contrast. COMPARISON:  10/12/2015 FINDINGS: Brain: Mild atrophy. Normal ventricular morphology. No midline shift or mass effect. Otherwise normal appearance of brain parenchyma. No intracranial hemorrhage, mass lesion or evidence acute infarction. No extra-axial fluid collections. Vascular: Minimal atherosclerotic calcification at the carotid siphons. Skull: Intact prior facial bone reconstructions of the anterior walls of the maxillary sinuses bilaterally. Sinuses/Orbits: Prior LEFT mastoidectomy Other: N/A IMPRESSION: No acute intracranial abnormalities. Prior LEFT mastoidectomy. Electronically Signed   By: Lavonia Dana M.D.   On: 02/14/2016 15:10   Mr Brain Wo Contrast (neuro Protocol)  Result Date: 02/14/2016  CLINICAL DATA:  Confusion, ataxia EXAM: MRI HEAD WITHOUT CONTRAST TECHNIQUE: Multiplanar, multiecho pulse sequences of the brain and surrounding structures were obtained without intravenous contrast. COMPARISON:  CT head 02/14/2016 FINDINGS: Brain: Mild atrophy. Negative for hydrocephalus. Negative for acute infarct. Mild chronic microvascular ischemic change in the white matter. Negative for hemorrhage or mass. No shift of the midline structures. Pituitary normal in size. Vascular: Normal arterial flow void. Skull and upper  cervical spine: Negative Sinuses/Orbits: Bilateral lens replacement. No orbital mass. Paranasal sinuses clear. Left mastoidectomy. Other: None IMPRESSION: No acute intracranial abnormality. Mild chronic microvascular ischemic change. Electronically Signed   By: Franchot Gallo M.D.   On: 02/14/2016 16:30   Mr Jodene Nam Head (cerebral Arteries)  Result Date: 02/14/2016 CLINICAL DATA:  Confusion, ataxia EXAM: MRA HEAD WITHOUT CONTRAST TECHNIQUE: Angiographic images of the Circle of Willis were obtained using MRA technique without intravenous contrast. COMPARISON:  MRI head today. FINDINGS: Right vertebral artery dominant. Both vertebral arteries patent to the basilar. PICA patent bilaterally. Basilar widely patent. Posterior cerebral arteries patent bilaterally. Internal carotid artery patent bilaterally. Anterior and middle cerebral arteries patent bilaterally without significant stenosis. No large vessel occlusion. Negative for aneurysm. IMPRESSION: Negative Electronically Signed   By: Franchot Gallo M.D.   On: 02/14/2016 16:35    EKG: Independently reviewed.  NSR with rate 89; nonspecific ST changes with no evidence of acute ischemia  Assessment/Plan Principal Problem:   Delirium Active Problems:   Diabetes (HCC)   Lactic acid increased   Delirium -Patient with delirium for several days, uncertain etiology -She was alert and oriented and able to converse appropriately with me in the ER -Evaluation has been primarily unremarkable thus far - negative UA, negative CXR, negative head CT/MRI. -Suspect that dizziness is related to vertigo. -Patient also has lactic acidosis (Lactate 2.7, repeat 2.0), possibly related to mild dehydration -Will observe overnight, rehydrate, and plan to discharge home tomorrow if lactate clears and her mentation is appropriate -If ongoing mental status issues, will need more thorough evaluation  DM -Suboptimally controlled -No recent A1c, will order -Glucose  279 -Continue Levemir but hold Glucophage and Novolog  DVT prophylaxis: Lovenox  Code Status: DNR - confirmed with patient Family Communication: None present Disposition Plan: Home once clinically improved Consults called: None  Admission status: It is my clinical opinion that referral for OBSERVATION is reasonable and necessary in this patient based on the above information provided. The aforementioned taken together are felt to place the patient at high risk for further clinical deterioration. However it is anticipated that the patient may be medically stable for discharge from the hospital within 24 to 48 hours.     Karmen Bongo MD [facility]  If 7PM-7AM, please contact night-coverage [email or web address] Password [email or web address]  02/14/2016, 11:06 PM

## 2016-02-14 NOTE — ED Triage Notes (Signed)
Pt states yesterday "something popped inside the back of my head." Pt has been confused since yesterday. Pt showed up in Radiology saying she was supposed to have xrays. Pt reports driving yesterday but does not know where she was going. Pt drove herself to the hospital today.

## 2016-02-14 NOTE — ED Notes (Signed)
CRITICAL VALUE ALERT  Critical value received:  Lactic Acid - 2.7  Date of notification:  02/14/2016  Time of notification:  1609  Critical value read back: yes  Nurse who received alert:  LJS  MD notified (1st page):  Dr Thurnell Garbe  Time of first page:  1610  MD notified (2nd page):  Time of second page:  Responding MD:  Dr Thurnell Garbe  Time MD responded:  [phone]

## 2016-02-15 DIAGNOSIS — E119 Type 2 diabetes mellitus without complications: Secondary | ICD-10-CM | POA: Diagnosis not present

## 2016-02-15 DIAGNOSIS — R410 Disorientation, unspecified: Secondary | ICD-10-CM | POA: Diagnosis not present

## 2016-02-15 LAB — CBC
HCT: 40.6 % (ref 36.0–46.0)
Hemoglobin: 14 g/dL (ref 12.0–15.0)
MCH: 30.9 pg (ref 26.0–34.0)
MCHC: 34.5 g/dL (ref 30.0–36.0)
MCV: 89.6 fL (ref 78.0–100.0)
Platelets: 224 10*3/uL (ref 150–400)
RBC: 4.53 MIL/uL (ref 3.87–5.11)
RDW: 13.6 % (ref 11.5–15.5)
WBC: 7.5 10*3/uL (ref 4.0–10.5)

## 2016-02-15 LAB — GLUCOSE, CAPILLARY
GLUCOSE-CAPILLARY: 319 mg/dL — AB (ref 65–99)
Glucose-Capillary: 176 mg/dL — ABNORMAL HIGH (ref 65–99)
Glucose-Capillary: 299 mg/dL — ABNORMAL HIGH (ref 65–99)
Glucose-Capillary: 316 mg/dL — ABNORMAL HIGH (ref 65–99)

## 2016-02-15 LAB — BASIC METABOLIC PANEL
ANION GAP: 6 (ref 5–15)
BUN: 15 mg/dL (ref 6–20)
CALCIUM: 9.5 mg/dL (ref 8.9–10.3)
CO2: 27 mmol/L (ref 22–32)
Chloride: 102 mmol/L (ref 101–111)
Creatinine, Ser: 0.77 mg/dL (ref 0.44–1.00)
GLUCOSE: 269 mg/dL — AB (ref 65–99)
Potassium: 4.5 mmol/L (ref 3.5–5.1)
SODIUM: 135 mmol/L (ref 135–145)

## 2016-02-15 LAB — LACTIC ACID, PLASMA
LACTIC ACID, VENOUS: 2.2 mmol/L — AB (ref 0.5–1.9)
LACTIC ACID, VENOUS: 2 mmol/L — AB (ref 0.5–1.9)

## 2016-02-15 MED ORDER — SODIUM CHLORIDE 0.9 % IV BOLUS (SEPSIS)
500.0000 mL | Freq: Once | INTRAVENOUS | Status: AC
  Administered 2016-02-15: 500 mL via INTRAVENOUS

## 2016-02-15 MED ORDER — MECLIZINE HCL 12.5 MG PO TABS
25.0000 mg | ORAL_TABLET | Freq: Two times a day (BID) | ORAL | Status: DC | PRN
  Administered 2016-02-15: 25 mg via ORAL
  Filled 2016-02-15: qty 2

## 2016-02-15 MED ORDER — SODIUM CHLORIDE 0.9 % IV BOLUS (SEPSIS): 500.0000 mL | Freq: Once | INTRAVENOUS | Status: DC

## 2016-02-15 NOTE — Care Management Note (Signed)
Case Management Note  Patient Details  Name: [name] MRN: [id number] Date of Birth: May 01, 1945  Subjective/Objective:     Patient adm with delirium/elevated lactic acid. She lives alone, ind with ADL's. Has cane and walker PTA if needed. Still drives to doctor appointments and has prescription drug coverage.                Action/Plan: Anticipate DC home today after re-check of labs. No CM needs.   Expected Discharge Date:           02/15/2016       Expected Discharge Plan:  Home/Self Care  In-House Referral:     Discharge planning Services  CM Consult  Post Acute Care Choice:  NA Choice offered to:  NA  DME Arranged:    DME Agency:     HH Arranged:    HH Agency:     Status of Service:  Completed, signed off  If discussed at [employer] of Stay Meetings, dates discussed:    Additional Comments:  [name], [name], RN 02/15/2016, 1:17 PM

## 2016-02-15 NOTE — Care Management Obs Status (Signed)
[place] NOTIFICATION   Patient Details  Name: [name] MRN: [id number] Date of Birth: Nov 19, 1945   Medicare Observation Status Notification Given:  Yes    Ras Kollman, [name], RN 02/15/2016, 1:18 PM

## 2016-02-15 NOTE — Progress Notes (Signed)
Subjective: She was admitted yesterday with confusion. Her lactic acid level was found to be elevated.  Objective: Vital signs in last 24 hours: Vitals:   02/14/16 1930 02/14/16 2030 02/14/16 2121 02/15/16 0452  BP: 176/71 156/60 (!) 163/69 (!) 181/65  Pulse: 91 87 89 63  Resp: 19 20 (!) 21 20  Temp:   97.8 F (36.6 C) 98.2 F (36.8 C)  TempSrc:   Oral Oral  SpO2: 98% 98% 98% 96%  Weight:   228 lb 4.8 oz (103.6 kg)   Height:   5\' 5"  (1.651 m)    Weight change:   Intake/Output Summary (Last 24 hours) at 02/15/16 0745 Last data filed at 02/15/16 0100  Gross per 24 hour  Intake              700 ml  Output               13 ml  Net              687 ml    Physical Exam: Alert. Oriented to place, month and year but not day of the week. She knows me. Speech is at baseline. HEENT unremarkable. Lungs clear. Heart regular with no murmurs. Abdomen soft and nontender. Ventral hernia present. Extremities reveal no edema. Neuro exam reveals no focal weakness. Cognitive status now at baseline.  Lab Results:    Results for orders placed or performed during the hospital encounter of 02/14/16 (from the past 24 hour(s))  CBG monitoring, ED     Status: Abnormal   Collection Time: 02/14/16  2:31 PM  Result Value Ref Range   Glucose-Capillary 255 (H) 65 - 99 mg/dL  Comprehensive metabolic panel     Status: Abnormal   Collection Time: 02/14/16  2:34 PM  Result Value Ref Range   Sodium 133 (L) 135 - 145 mmol/L   Potassium 4.5 3.5 - 5.1 mmol/L   Chloride 99 (L) 101 - 111 mmol/L   CO2 26 22 - 32 mmol/L   Glucose, Bld 270 (H) 65 - 99 mg/dL   BUN 16 6 - 20 mg/dL   Creatinine, Ser 0.80 0.44 - 1.00 mg/dL   Calcium 10.1 8.9 - 10.3 mg/dL   Total Protein 7.2 6.5 - 8.1 g/dL   Albumin 3.9 3.5 - 5.0 g/dL   AST 27 15 - 41 U/L   ALT 23 14 - 54 U/L   Alkaline Phosphatase 107 38 - 126 U/L   Total Bilirubin 0.6 0.3 - 1.2 mg/dL   GFR calc non Af Amer >60 >60 mL/min   GFR calc Af Amer >60 >60 mL/min   Anion gap 8 5 - 15  CBC     Status: Abnormal   Collection Time: 02/14/16  2:34 PM  Result Value Ref Range   WBC 9.1 4.0 - 10.5 K/uL   RBC 4.90 3.87 - 5.11 MIL/uL   Hemoglobin 15.3 (H) 12.0 - 15.0 g/dL   HCT 43.5 36.0 - 46.0 %   MCV 88.8 78.0 - 100.0 fL   MCH 31.2 26.0 - 34.0 pg   MCHC 35.2 30.0 - 36.0 g/dL   RDW 13.6 11.5 - 15.5 %   Platelets 238 150 - 400 K/uL  Differential     Status: None   Collection Time: 02/14/16  2:34 PM  Result Value Ref Range   Neutrophils Relative % 74 %   Neutro Abs 6.5 1.7 - 7.7 K/uL   Lymphocytes Relative 16 %   Lymphs Abs 1.3  0.7 - 4.0 K/uL   Monocytes Relative 7 %   Monocytes Absolute 0.6 0.1 - 1.0 K/uL   Eosinophils Relative 2 %   Eosinophils Absolute 0.2 0.0 - 0.7 K/uL   Basophils Relative 1 %   Basophils Absolute 0.0 0.0 - 0.1 K/uL  Acetaminophen level     Status: Abnormal   Collection Time: 02/14/16  2:34 PM  Result Value Ref Range   Acetaminophen (Tylenol), Serum <10 (L) 10 - 30 ug/mL  Ethanol     Status: None   Collection Time: 02/14/16  2:34 PM  Result Value Ref Range   Alcohol, Ethyl (B) <5 <5 mg/dL  Salicylate level     Status: None   Collection Time: 02/14/16  2:34 PM  Result Value Ref Range   Salicylate Lvl [id number] 2.8 - 30.0 mg/dL  Troponin I     Status: None   Collection Time: 02/14/16  2:34 PM  Result Value Ref Range   Troponin I <0.03 <0.03 ng/mL  Urine rapid drug screen (hosp performed)     Status: None   Collection Time: 02/14/16  2:44 PM  Result Value Ref Range   Opiates NONE DETECTED NONE DETECTED   Cocaine NONE DETECTED NONE DETECTED   Benzodiazepines NONE DETECTED NONE DETECTED   Amphetamines NONE DETECTED NONE DETECTED   Tetrahydrocannabinol NONE DETECTED NONE DETECTED   Barbiturates NONE DETECTED NONE DETECTED  Urinalysis, Routine w reflex microscopic     Status: Abnormal   Collection Time: 02/14/16  2:44 PM  Result Value Ref Range   Color, Urine YELLOW YELLOW   APPearance CLEAR CLEAR   Specific Gravity, Urine  1.026 1.005 - 1.030   pH 5.0 5.0 - 8.0   Glucose, UA >=500 (A) NEGATIVE mg/dL   Hgb urine dipstick NEGATIVE NEGATIVE   Bilirubin Urine NEGATIVE NEGATIVE   Ketones, ur 5 (A) NEGATIVE mg/dL   Protein, ur NEGATIVE NEGATIVE mg/dL   Nitrite NEGATIVE NEGATIVE   Leukocytes, UA NEGATIVE NEGATIVE   RBC / HPF 0-5 0 - 5 RBC/hpf   WBC, UA 0-5 0 - 5 WBC/hpf   Bacteria, UA NONE SEEN NONE SEEN  Lactic acid, plasma     Status: Abnormal   Collection Time: 02/14/16  3:18 PM  Result Value Ref Range   Lactic Acid, Venous 2.7 (HH) 0.5 - 1.9 mmol/L  Lactic acid, plasma     Status: Abnormal   Collection Time: 02/14/16  5:58 PM  Result Value Ref Range   Lactic Acid, Venous 2.0 (HH) 0.5 - 1.9 mmol/L  Glucose, capillary     Status: Abnormal   Collection Time: 02/14/16  9:25 PM  Result Value Ref Range   Glucose-Capillary 279 (H) 65 - 99 mg/dL  Lactic acid, plasma     Status: Abnormal   Collection Time: 02/14/16 10:40 PM  Result Value Ref Range   Lactic Acid, Venous 2.5 (HH) 0.5 - 1.9 mmol/L  Basic metabolic panel     Status: Abnormal   Collection Time: 02/15/16  1:00 AM  Result Value Ref Range   Sodium 135 135 - 145 mmol/L   Potassium 4.5 3.5 - 5.1 mmol/L   Chloride 102 101 - 111 mmol/L   CO2 27 22 - 32 mmol/L   Glucose, Bld 269 (H) 65 - 99 mg/dL   BUN 15 6 - 20 mg/dL   Creatinine, Ser 0.77 0.44 - 1.00 mg/dL   Calcium 9.5 8.9 - 10.3 mg/dL   GFR calc non Af Amer >60 >60 mL/min  GFR calc Af Amer >60 >60 mL/min   Anion gap 6 5 - 15  CBC     Status: None   Collection Time: 02/15/16  1:00 AM  Result Value Ref Range   WBC 7.5 4.0 - 10.5 K/uL   RBC 4.53 3.87 - 5.11 MIL/uL   Hemoglobin 14.0 12.0 - 15.0 g/dL   HCT 40.6 36.0 - 46.0 %   MCV 89.6 78.0 - 100.0 fL   MCH 30.9 26.0 - 34.0 pg   MCHC 34.5 30.0 - 36.0 g/dL   RDW 13.6 11.5 - 15.5 %   Platelets 224 150 - 400 K/uL  Lactic acid, plasma     Status: Abnormal   Collection Time: 02/15/16  1:00 AM  Result Value Ref Range   Lactic Acid, Venous 2.2  (HH) 0.5 - 1.9 mmol/L     ABGS No results for input(s): PHART, PO2ART, TCO2, HCO3 in the last 72 hours.  Invalid input(s): PCO2 CULTURES No results found for this or any previous visit (from the past 240 hour(s)). Studies/Results: Dg Chest 2 View  Result Date: 02/14/2016 CLINICAL DATA:  Altered mental status EXAM: CHEST  2 VIEW COMPARISON:  October 12, 2015 FINDINGS: Lungs are clear. The heart size and pulmonary vascularity are normal. No adenopathy. Patient is status post coronary artery bypass grafting. No pneumothorax. No bone lesions. IMPRESSION: No edema or consolidation. Electronically Signed   By: Lowella Grip III M.D.   On: 02/14/2016 15:27   Ct Head Wo Contrast  Result Date: 02/14/2016 CLINICAL DATA:  Altered mental status, patient states "something popped inside the back of my head yesterday", confusion since, lethargy, history hypertension, diabetes mellitus, coronary artery disease, fibromyalgia, morbid obesity EXAM: CT HEAD WITHOUT CONTRAST TECHNIQUE: Contiguous axial images were obtained from the base of the skull through the vertex without intravenous contrast. COMPARISON:  10/12/2015 FINDINGS: Brain: Mild atrophy. Normal ventricular morphology. No midline shift or mass effect. Otherwise normal appearance of brain parenchyma. No intracranial hemorrhage, mass lesion or evidence acute infarction. No extra-axial fluid collections. Vascular: Minimal atherosclerotic calcification at the carotid siphons. Skull: Intact prior facial bone reconstructions of the anterior walls of the maxillary sinuses bilaterally. Sinuses/Orbits: Prior LEFT mastoidectomy Other: N/A IMPRESSION: No acute intracranial abnormalities. Prior LEFT mastoidectomy. Electronically Signed   By: Lavonia Dana M.D.   On: 02/14/2016 15:10   Mr Brain Wo Contrast (neuro Protocol)  Result Date: 02/14/2016 CLINICAL DATA:  Confusion, ataxia EXAM: MRI HEAD WITHOUT CONTRAST TECHNIQUE: Multiplanar, multiecho pulse sequences  of the brain and surrounding structures were obtained without intravenous contrast. COMPARISON:  CT head 02/14/2016 FINDINGS: Brain: Mild atrophy. Negative for hydrocephalus. Negative for acute infarct. Mild chronic microvascular ischemic change in the white matter. Negative for hemorrhage or mass. No shift of the midline structures. Pituitary normal in size. Vascular: Normal arterial flow void. Skull and upper cervical spine: Negative Sinuses/Orbits: Bilateral lens replacement. No orbital mass. Paranasal sinuses clear. Left mastoidectomy. Other: None IMPRESSION: No acute intracranial abnormality. Mild chronic microvascular ischemic change. Electronically Signed   By: Franchot Gallo M.D.   On: 02/14/2016 16:30   Mr Jodene Nam Head (cerebral Arteries)  Result Date: 02/14/2016 CLINICAL DATA:  Confusion, ataxia EXAM: MRA HEAD WITHOUT CONTRAST TECHNIQUE: Angiographic images of the Circle of Willis were obtained using MRA technique without intravenous contrast. COMPARISON:  MRI head today. FINDINGS: Right vertebral artery dominant. Both vertebral arteries patent to the basilar. PICA patent bilaterally. Basilar widely patent. Posterior cerebral arteries patent bilaterally. Internal carotid artery patent bilaterally. Anterior  and middle cerebral arteries patent bilaterally without significant stenosis. No large vessel occlusion. Negative for aneurysm. IMPRESSION: Negative Electronically Signed   By: Franchot Gallo M.D.   On: 02/14/2016 16:35   Micro Results: No results found for this or any previous visit (from the past 240 hour(s)). Studies/Results: Dg Chest 2 View  Result Date: 02/14/2016 CLINICAL DATA:  Altered mental status EXAM: CHEST  2 VIEW COMPARISON:  October 12, 2015 FINDINGS: Lungs are clear. The heart size and pulmonary vascularity are normal. No adenopathy. Patient is status post coronary artery bypass grafting. No pneumothorax. No bone lesions. IMPRESSION: No edema or consolidation. Electronically Signed    By: Lowella Grip III M.D.   On: 02/14/2016 15:27   Ct Head Wo Contrast  Result Date: 02/14/2016 CLINICAL DATA:  Altered mental status, patient states "something popped inside the back of my head yesterday", confusion since, lethargy, history hypertension, diabetes mellitus, coronary artery disease, fibromyalgia, morbid obesity EXAM: CT HEAD WITHOUT CONTRAST TECHNIQUE: Contiguous axial images were obtained from the base of the skull through the vertex without intravenous contrast. COMPARISON:  10/12/2015 FINDINGS: Brain: Mild atrophy. Normal ventricular morphology. No midline shift or mass effect. Otherwise normal appearance of brain parenchyma. No intracranial hemorrhage, mass lesion or evidence acute infarction. No extra-axial fluid collections. Vascular: Minimal atherosclerotic calcification at the carotid siphons. Skull: Intact prior facial bone reconstructions of the anterior walls of the maxillary sinuses bilaterally. Sinuses/Orbits: Prior LEFT mastoidectomy Other: N/A IMPRESSION: No acute intracranial abnormalities. Prior LEFT mastoidectomy. Electronically Signed   By: Lavonia Dana M.D.   On: 02/14/2016 15:10   Mr Brain Wo Contrast (neuro Protocol)  Result Date: 02/14/2016 CLINICAL DATA:  Confusion, ataxia EXAM: MRI HEAD WITHOUT CONTRAST TECHNIQUE: Multiplanar, multiecho pulse sequences of the brain and surrounding structures were obtained without intravenous contrast. COMPARISON:  CT head 02/14/2016 FINDINGS: Brain: Mild atrophy. Negative for hydrocephalus. Negative for acute infarct. Mild chronic microvascular ischemic change in the white matter. Negative for hemorrhage or mass. No shift of the midline structures. Pituitary normal in size. Vascular: Normal arterial flow void. Skull and upper cervical spine: Negative Sinuses/Orbits: Bilateral lens replacement. No orbital mass. Paranasal sinuses clear. Left mastoidectomy. Other: None IMPRESSION: No acute intracranial abnormality. Mild chronic  microvascular ischemic change. Electronically Signed   By: Franchot Gallo M.D.   On: 02/14/2016 16:30   Mr Jodene Nam Head (cerebral Arteries)  Result Date: 02/14/2016 CLINICAL DATA:  Confusion, ataxia EXAM: MRA HEAD WITHOUT CONTRAST TECHNIQUE: Angiographic images of the Circle of Willis were obtained using MRA technique without intravenous contrast. COMPARISON:  MRI head today. FINDINGS: Right vertebral artery dominant. Both vertebral arteries patent to the basilar. PICA patent bilaterally. Basilar widely patent. Posterior cerebral arteries patent bilaterally. Internal carotid artery patent bilaterally. Anterior and middle cerebral arteries patent bilaterally without significant stenosis. No large vessel occlusion. Negative for aneurysm. IMPRESSION: Negative Electronically Signed   By: Franchot Gallo M.D.   On: 02/14/2016 16:35   Medications:  I have reviewed the patient's current medications Scheduled Meds: . atenolol  50 mg Oral QHS  . docusate sodium  100 mg Oral BID  . donepezil  10 mg Oral QHS  . enoxaparin (LOVENOX) injection  40 mg Subcutaneous Q24H  . FLUoxetine  20 mg Oral TID  . gabapentin  300 mg Oral BID  . insulin aspart  0-15 Units Subcutaneous TID WC  . insulin aspart  0-5 Units Subcutaneous QHS  . insulin detemir  60 Units Subcutaneous BID  . isosorbide mononitrate  15  mg Oral Daily  . losartan  100 mg Oral q morning - 10a  . rosuvastatin  20 mg Oral Daily   Continuous Infusions: . lactated ringers 125 mL/hr at 02/14/16 2324   PRN Meds:.acetaminophen **OR** acetaminophen, meclizine, ondansetron **OR** ondansetron (ZOFRAN) IV   Assessment/Plan: #1. Lactic acidosis. Etiology unclear. No fever. No leukocytosis. Chest x-ray reveals no infiltrate. Urinalysis reveals 0-5 WBCs. Repeat level this morning. Continue IV fluids. #2. Confusion. 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 Continue Aricept. Drug screen negative. MRI of the brain reveals no acute  abnormality. #3. Diabetes. Continue Levemir and NovoLog. Metformin held. #4. Coronary artery disease status post CABG. Asymptomatic. #5. Hypertension. Continue losartan and atenolol. #6. History of colon cancer. She has had no sign of recurrence. Principal Problem:   Delirium Active Problems:   Diabetes ([place])   Lactic acid increased     LOS: 0 days   Jodeen Mclin 02/15/2016, 7:45 AM

## 2016-02-16 DIAGNOSIS — R410 Disorientation, unspecified: Secondary | ICD-10-CM | POA: Diagnosis not present

## 2016-02-16 DIAGNOSIS — E119 Type 2 diabetes mellitus without complications: Secondary | ICD-10-CM | POA: Diagnosis not present

## 2016-02-16 LAB — HEMOGLOBIN A1C
HEMOGLOBIN A1C: 10.5 % — AB (ref 4.8–5.6)
Mean Plasma Glucose: 255 mg/dL

## 2016-02-16 LAB — GLUCOSE, CAPILLARY: Glucose-Capillary: 140 mg/dL — ABNORMAL HIGH (ref 65–99)

## 2016-02-16 LAB — URINE CULTURE: CULTURE: NO GROWTH

## 2016-02-16 LAB — LACTIC ACID, PLASMA: Lactic Acid, Venous: 1.7 mmol/L (ref 0.5–1.9)

## 2016-02-16 NOTE — Discharge Summary (Signed)
Physician Discharge Summary  [name] [id number] DOB: [birth year] DOA: 02/14/2016   Admit date: 02/14/2016 Discharge date: 02/16/2016  Discharge Diagnoses:  Principal Problem:   Delirium Active Problems:   Diabetes ([place])   Lactic acid increased    Wt Readings from Last 3 Encounters:  02/14/16 228 lb 4.8 oz (103.6 kg)  10/12/15 245 lb (111.1 kg)  05/05/15 245 lb (111.1 kg)     Hospital Course:  This patient is a 70 year old female who presented to the emergency room with confusion. There was no fever. Blood counts were normal. She underwent a CT scan of the brain which revealed no acute abnormality. She subsequently underwent an MRI of the brain and an MRA of her head revealing no acute abnormalities. She was hospitalized for observation. Her mental status has remained at baseline. She has dementia from probable Alzheimer's disease. She is treated with donepezil.  Showed a mild elevation in her lactic acid level. No evidence of sepsis was identified. Lactic acid returned to normal at 1.7.  She was treated with IV hydration.  Diabetes control has been suboptimal on Levemir and NovoLog. She had previously been on metformin as well. Metformin was discontinued.  Urinalysis revealed only 0-5 white cells however a culture was obtained. Culture remains pending at discharge.    She has been stable from a cardiovascular standpoint. She is status post bypass surgery in 2013. She was continued on her usual cardiovascular medications.  Her condition at discharge is improved. She is alert and oriented. Lungs clear. Heart regular with no murmurs. Abdomen soft and nontender with a stable ventral hernia. Extremities reveal no edema.  The patient will be seen in follow-up in my office in one week. She'll continue her usual medications with the exception of the discontinuation of metformin and naproxen.   Discharge Instructions   Allergies as of 02/16/2016      Reactions   Latex  Itching   Only where touched on body with it.   Morphine Shortness Of Breath, Itching, Other (See Comments)   Makes patient feel as if her "insides" are on fire.   Adhesive [tape] Other (See Comments)   Patient states that she breaks out in blisters   Codeine Itching, Rash   All over body.   Penicillins Itching, Rash   Localized.      Medication List    STOP taking these medications   ALEVE 220 MG tablet Generic drug:  naproxen sodium   metFORMIN 500 MG tablet Commonly known as:  GLUCOPHAGE     TAKE these medications   atenolol 50 MG tablet Commonly known as:  TENORMIN Take 50 mg by mouth at bedtime.   donepezil 10 MG tablet Commonly known as:  ARICEPT Take 10 mg by mouth at bedtime.   FLUoxetine 20 MG capsule Commonly known as:  PROZAC Take 20 mg by mouth 3 (three) times daily.   gabapentin 300 MG capsule Commonly known as:  NEURONTIN Take 300 mg by mouth 2 (two) times daily.   insulin aspart 100 UNIT/ML injection Commonly known as:  novoLOG Inject 30 Units into the skin 2 (two) times daily.   insulin detemir 100 UNIT/ML injection Commonly known as:  LEVEMIR Inject 60 Units into the skin 2 (two) times daily.   isosorbide mononitrate 30 MG 24 hr tablet Commonly known as:  IMDUR Take 0.5 tablets (15 mg total) by mouth daily.   losartan 100 MG tablet Commonly known as:  COZAAR Take 100 mg by mouth every morning.  multivitamin with minerals Tabs tablet Take 1 tablet by mouth every morning.   NITROSTAT 0.4 MG SL tablet Generic drug:  nitroGLYCERIN DISSOLVE ONE TABLET UNDER TONGUE EVERY 5 MINUTES UP TO 3 DOSES AS NEEDED FOR CHEST PAIN   rosuvastatin 40 MG tablet Commonly known as:  CRESTOR Take 20 mg by mouth daily.        [name] 02/16/2016

## 2016-03-12 DIAGNOSIS — E119 Type 2 diabetes mellitus without complications: Secondary | ICD-10-CM | POA: Diagnosis not present

## 2016-03-12 DIAGNOSIS — E039 Hypothyroidism, unspecified: Secondary | ICD-10-CM | POA: Diagnosis not present

## 2016-03-12 DIAGNOSIS — Z79899 Other long term (current) drug therapy: Secondary | ICD-10-CM | POA: Diagnosis not present

## 2016-03-29 DIAGNOSIS — E119 Type 2 diabetes mellitus without complications: Secondary | ICD-10-CM | POA: Diagnosis not present

## 2016-03-29 DIAGNOSIS — G309 Alzheimer's disease, unspecified: Secondary | ICD-10-CM | POA: Diagnosis not present

## 2016-06-04 DIAGNOSIS — I10 Essential (primary) hypertension: Secondary | ICD-10-CM | POA: Diagnosis not present

## 2016-06-04 DIAGNOSIS — R42 Dizziness and giddiness: Secondary | ICD-10-CM | POA: Diagnosis not present

## 2016-06-12 DIAGNOSIS — R69 Illness, unspecified: Secondary | ICD-10-CM | POA: Diagnosis not present

## 2016-06-26 ENCOUNTER — Ambulatory Visit (INDEPENDENT_AMBULATORY_CARE_PROVIDER_SITE_OTHER): Payer: Medicare Other | Admitting: Orthopaedic Surgery

## 2016-07-17 ENCOUNTER — Ambulatory Visit (INDEPENDENT_AMBULATORY_CARE_PROVIDER_SITE_OTHER): Payer: Medicare Other | Admitting: Orthopaedic Surgery

## 2016-07-17 NOTE — Progress Notes (Signed)
Patient did not keep appointment /erroneous encounter

## 2016-07-18 DIAGNOSIS — M1990 Unspecified osteoarthritis, unspecified site: Secondary | ICD-10-CM | POA: Diagnosis not present

## 2016-07-18 DIAGNOSIS — E119 Type 2 diabetes mellitus without complications: Secondary | ICD-10-CM | POA: Diagnosis not present

## 2016-07-18 DIAGNOSIS — R413 Other amnesia: Secondary | ICD-10-CM | POA: Diagnosis not present

## 2016-07-31 ENCOUNTER — Encounter (INDEPENDENT_AMBULATORY_CARE_PROVIDER_SITE_OTHER): Payer: Self-pay | Admitting: Orthopaedic Surgery

## 2016-07-31 ENCOUNTER — Ambulatory Visit (INDEPENDENT_AMBULATORY_CARE_PROVIDER_SITE_OTHER): Payer: Medicare Other

## 2016-07-31 ENCOUNTER — Ambulatory Visit (INDEPENDENT_AMBULATORY_CARE_PROVIDER_SITE_OTHER): Payer: Medicare Other | Admitting: Orthopaedic Surgery

## 2016-07-31 VITALS — Ht 65.0 in | Wt 225.0 lb

## 2016-07-31 DIAGNOSIS — Z96652 Presence of left artificial knee joint: Secondary | ICD-10-CM

## 2016-07-31 DIAGNOSIS — Z96651 Presence of right artificial knee joint: Secondary | ICD-10-CM

## 2016-07-31 NOTE — Progress Notes (Signed)
Office Visit Note   Patient: [name]           Date of Birth: Jan 07, 1946           MRN: [id number] Visit Date: 07/31/2016              Requested by: Laura Noble, MD [address] [place], [place] [address] PCP: Laura Noble, MD   Assessment & Plan: Visit Diagnoses:  1. History of total right knee replacement   2. History of left knee replacement   Painful bilateral total knee replacements without obvious problems.  Plan: We'll order a 3-phase bone scan of both knees to be sure there is no evidence of loosening or infection. May consider further lab work, aspiration. Also consider further evaluation of lumbar spine as possible cause of discomfort with stenosis  Follow-Up Instructions: Return will scvhedule 3 phase bone scan.   Orders:  Orders Placed This Encounter  Procedures  . XR Lumbar Spine 2-3 Views  . XR KNEE 3 VIEW LEFT  . XR KNEE 3 VIEW RIGHT   No orders of the defined types were placed in this encounter.     Procedures: No procedures performed   Clinical Data: No additional findings.   Subjective: Chief Complaint  Patient presents with  . Right Knee - Pain, Weakness, Edema  . Left Knee - Pain, Weakness, Edema  [name] relates that she's had bilateral total knee replacements performed in [place]. She believes one was performed in 2005 and the other in 2006. She does not remember the name of the physician. She's been having trouble with her knees to the point of compromise. He oftentimes will "fall" and cannot get up. Her knees give way. She's not having any groin pain or trouble with either groin. She denies fever or chills. She has a number of medical comorbidities including heart issues and diabetes. She does have some burning in both of her feet. She's having difficulty localizing her pain and is not sure if it's just her knees or possibly her "legs". She also relates to some "memory loss". HPI  Review of Systems   Objective: Vital  Signs: Ht 5\' 5"  (1.651 m)   Wt 225 lb (102.1 kg)   BMI 37.44 kg/m   Physical Exam  Ortho Exam Exam of the knees demonstrates no obvious effusion. Full extension and flexion to 105 on the right and 100 on the left. No instability. Negative anterior drawer sign. Neither knee was hot warm or red. No calf pain. No swelling distally. Minimal pulses but good capillary refill. Straight leg raise negative bilaterally. Painless range of motion of both hips. Specialty Comments:  No specialty comments available.  Imaging: Xr Knee 3 View Left  Result Date: 07/31/2016 3 views left knee were obtained. There is a left total knee replacement in good position. I don't see any complicating factors. Nice glue mantle without obvious evidence of loosening. Patella tracks in the midline. No ectopic calcification. Nice alignment without evidence of fracture.  Xr Knee 3 View Right  Result Date: 07/31/2016 Films of the right knee reveal a right total knee replacement. There is an excellent glue mantle without obvious complicating factors. Some ectopic calcification along the lateral tibial plateau. No evidence of obvious loosening. Patella in good position no obvious fracture  Xr Lumbar Spine 2-3 Views  Result Date: 07/31/2016 Views of the lumbar spine obtained in the AP lateral projection. There is a very minimal right degenerative scoliosis. Slight anterior listhesis of  L4 on 5. Facet joint degenerative changes at L4-5 and L5-S1. Some mild calcification of the abdominal aorta. Normal lumbar  lordosis    PMFS History: Patient Active Problem List   Diagnosis Date Noted  . Delirium 02/14/2016  . Lactic acid increased 02/14/2016  . Cellulitis 03/26/2013  . Vomiting 02/08/2012  . 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 Recurrent ventral incisional hernia 09/26/2010  . Obesity, Class III, BMI 40-49.9 (morbid obesity) ([place]) 09/26/2010  . HYPERTENSION, BENIGN 02/06/2009  . PERSONAL HISTORY MALIG NEOPLASM LARGE INTESTINE  12/14/2008  . COUGH 10/26/2008  . DIASTOLIC HEART FAILURE, CHRONIC 09/27/2008  . CAD, NATIVE VESSEL 09/14/2008  . CHEST PAIN UNSPECIFIED 09/13/2008  . Malignant neoplasm of ascending colon ([place]) 12/07/2007  . BENIGN NEOPLASM OF COLON 09/23/2007  . Diabetes ([place]) 09/23/2007  . IRON DEFICIENCY 09/23/2007  . GASTROPARESIS 09/23/2007  . FIBROMYALGIA 09/23/2007  . FECAL OCCULT BLOOD 09/23/2007   Past Medical History:  Diagnosis Date  . Acoustic neuroma (HCC)    left ear  . Adenocarcinoma ([place])    ascending colon, LNs pos -- S/P right hemicolectomy, xeloda completed 6/10 (dr. Sonny Dandy)  . Anginal pain ([place])    DR Theola Sequin    . Anxiety   . Asthma    AS CHILD   . Blurred vision   . Coronary artery disease    a. s/p stent to lad 2006 w subsequent kissing balloon pci to diagonal;  b.  pci of the lad w a drug-eluting stent July 2010;  c.  LHC 11/13/11: p-mLAD stent with prox 50% ISR, 80% beyond dist edge of stent, m+dLAD beyond stent 40-50%, mod caliber pD1 80%, mRCA 70%, m-dRCA 20-30%, EF 55-65% => med Rx (consider CABG)  . Depressed   . Diabetes mellitus   . Dyslipidemia   . Fibromyalgia   . Gastroparesis   . GERD (gastroesophageal reflux disease)   . Headache(784.0)   . Hiatal hernia   . Hypertension   . Morbid obesity ([place])   . Osteoarthritis   . Parkinson disease ([place])    ambulates with cane  . Sleep apnea    12+ YRS NO MACHINE GOTTEN   . Varicose vein     Family History  Problem Relation Age of Onset  . Diabetes Maternal Grandmother        unspecif if Maternal or Paternal  . Heart failure Mother   . Cirrhosis Father   . Coronary artery disease Other        family history  . Asthma Other        family history-grandfather  . Colon cancer Other        family hx    Past Surgical History:  Procedure Laterality Date  . ABDOMINAL HYSTERECTOMY  1993  . APPENDECTOMY    . APPLICATION OF WOUND VAC N/A 03/29/2013   Procedure: APPLICATION OF WOUND VAC;  Surgeon: Jamesetta So, MD;  Location: [facility];  Service: General;  Laterality: N/A;  . BACK SURGERY    . CARDIAC CATHETERIZATION    . CATARACT EXTRACTION    . CHOLECYSTECTOMY  1966  . COLON SURGERY    . COLONOSCOPY N/A 09/21/2013   Procedure: COLONOSCOPY;  Surgeon: Inda Castle, MD;  [facility];  Service: Endoscopy;  Laterality: N/A;  . CORONARY ARTERY BYPASS GRAFT  01/20/2012   Procedure: CORONARY ARTERY BYPASS GRAFTING (CABG);  Surgeon: Gaye Pollack, MD;  Location: [facility];  Service: Open Heart Surgery;  Laterality: N/A;  Coronary  artery bypass graft times three utilizing the left internal mammary artery and the right greater saphenous vein harvested endoscopically  . ENDOVEIN HARVEST OF GREATER SAPHENOUS VEIN  01/20/2012   Procedure: ENDOVEIN HARVEST OF GREATER SAPHENOUS VEIN;  Surgeon: Gaye Pollack, MD;  Location: [facility];  Service: Open Heart Surgery;  Laterality: Right;  . ESOPHAGOGASTRODUODENOSCOPY N/A 04/19/2014   Procedure: ESOPHAGOGASTRODUODENOSCOPY (EGD);  Surgeon: Jamesetta So, MD;  [facility];  Service: Gastroenterology;  Laterality: N/A;  . EXCISION MORTON'S NEUROMA    . EYE SURGERY     cataract on right 2005, left 2008  . FETAL SURGERY FOR CONGENITAL HERNIA    . FINGER REPLANTATION     RECENTLY   . HEMICOLECTOMY  12/28/07   right for adenocarcinoma of ascending colon  . [place], 2002, 2007   open x1, lap x2   . herniaorraphy  01/22/06   for recurrent ventral hernia  . HYSTEROTOMY  1993  . INCISION AND DRAINAGE ABSCESS N/A 03/29/2013   Procedure: INCISION AND DRAINAGE ABDOMINAL WALL ABSCESS;  Surgeon: Jamesetta So, MD;  Location: [facility];  Service: General;  Laterality: N/A;  . JOINT REPLACEMENT  2005 and 2006   right 2005, left 2006  . KNEE ARTHROSCOPY    . LAPAROTOMY     for small bowl obstruction  . LEFT HEART CATHETERIZATION WITH CORONARY ANGIOGRAM N/A 11/13/2011   Procedure: LEFT HEART CATHETERIZATION WITH CORONARY ANGIOGRAM;  Surgeon:  Sherren Mocha, MD;  Location: [facility] C[facility] LAB;  Service: Cardiovascular;  Laterality: N/A;  . LESION REMOVAL  10/08/2011   Procedure: LESION REMOVAL;  Surgeon: Sanjuana Kava, MD;  Location[facility];  Service: Orthopedics;  Laterality: Right;  Removal Lesion Right Long Finger  . MANDIBLE SURGERY    . PORT-A-CATH REMOVAL Right 06/17/2014   Procedure: REMOVAL PORT-A-CATH;  Surgeon: Aviva Signs Md, MD;  Location: [facility];  Service: General;  Laterality: Right;  . PORTACATH PLACEMENT    . REPLACEMENT TOTAL KNEE    . resecton of acoustic neuroma    . stent surgery    . TONSILLECTOMY     Social History   Occupational History  . retired    Social History Main Topics  . Smoking status: Never Smoker  . Smokeless tobacco: Never Used  . Alcohol use No     Comment: occasional  . Drug use: No  . Sexual activity: Yes    Birth control/ protection: Surgical     Garald Balding, MD   Note - This record has been created using [employer].  Chart creation errors have been sought, but may not always  have been located. Such creation errors do not reflect on  the standard of medical care.

## 2016-08-14 DIAGNOSIS — Z7901 Long term (current) use of anticoagulants: Secondary | ICD-10-CM | POA: Diagnosis not present

## 2016-08-14 DIAGNOSIS — E119 Type 2 diabetes mellitus without complications: Secondary | ICD-10-CM | POA: Diagnosis not present

## 2016-08-14 DIAGNOSIS — I10 Essential (primary) hypertension: Secondary | ICD-10-CM | POA: Diagnosis not present

## 2016-08-14 DIAGNOSIS — E785 Hyperlipidemia, unspecified: Secondary | ICD-10-CM | POA: Diagnosis not present

## 2016-08-16 DIAGNOSIS — E119 Type 2 diabetes mellitus without complications: Secondary | ICD-10-CM | POA: Diagnosis not present

## 2016-08-16 DIAGNOSIS — E785 Hyperlipidemia, unspecified: Secondary | ICD-10-CM | POA: Diagnosis not present

## 2017-02-13 DIAGNOSIS — T1490XA Injury, unspecified, initial encounter: Secondary | ICD-10-CM | POA: Diagnosis not present

## 2017-03-07 DIAGNOSIS — R404 Transient alteration of awareness: Secondary | ICD-10-CM | POA: Diagnosis not present

## 2017-03-07 DIAGNOSIS — R531 Weakness: Secondary | ICD-10-CM | POA: Diagnosis not present

## 2017-04-28 DIAGNOSIS — E119 Type 2 diabetes mellitus without complications: Secondary | ICD-10-CM | POA: Diagnosis not present

## 2017-04-28 DIAGNOSIS — Z79899 Other long term (current) drug therapy: Secondary | ICD-10-CM | POA: Diagnosis not present

## 2017-05-27 DIAGNOSIS — S0003XA Contusion of scalp, initial encounter: Secondary | ICD-10-CM | POA: Diagnosis not present

## 2017-05-27 DIAGNOSIS — Z7982 Long term (current) use of aspirin: Secondary | ICD-10-CM | POA: Diagnosis not present

## 2017-05-27 DIAGNOSIS — S0990XA Unspecified injury of head, initial encounter: Secondary | ICD-10-CM | POA: Diagnosis not present

## 2017-05-27 DIAGNOSIS — R739 Hyperglycemia, unspecified: Secondary | ICD-10-CM | POA: Diagnosis not present

## 2017-05-27 DIAGNOSIS — N390 Urinary tract infection, site not specified: Secondary | ICD-10-CM | POA: Diagnosis not present

## 2017-05-27 DIAGNOSIS — W0110XA Fall on same level from slipping, tripping and stumbling with subsequent striking against unspecified object, initial encounter: Secondary | ICD-10-CM | POA: Diagnosis not present

## 2017-05-27 DIAGNOSIS — Z794 Long term (current) use of insulin: Secondary | ICD-10-CM | POA: Diagnosis not present

## 2017-05-27 DIAGNOSIS — Z79899 Other long term (current) drug therapy: Secondary | ICD-10-CM | POA: Diagnosis not present

## 2017-05-27 DIAGNOSIS — R319 Hematuria, unspecified: Secondary | ICD-10-CM | POA: Diagnosis not present

## 2017-05-27 DIAGNOSIS — S0181XA Laceration without foreign body of other part of head, initial encounter: Secondary | ICD-10-CM | POA: Diagnosis not present

## 2017-05-27 DIAGNOSIS — S098XXA Other specified injuries of head, initial encounter: Secondary | ICD-10-CM | POA: Diagnosis not present

## 2017-05-27 DIAGNOSIS — R9431 Abnormal electrocardiogram [ECG] [EKG]: Secondary | ICD-10-CM | POA: Diagnosis not present

## 2017-05-27 DIAGNOSIS — J984 Other disorders of lung: Secondary | ICD-10-CM | POA: Diagnosis not present

## 2017-05-27 DIAGNOSIS — E1165 Type 2 diabetes mellitus with hyperglycemia: Secondary | ICD-10-CM | POA: Diagnosis not present

## 2017-05-27 DIAGNOSIS — S064X0A Epidural hemorrhage without loss of consciousness, initial encounter: Secondary | ICD-10-CM | POA: Diagnosis not present

## 2017-06-01 DIAGNOSIS — S098XXA Other specified injuries of head, initial encounter: Secondary | ICD-10-CM | POA: Diagnosis not present

## 2017-06-01 DIAGNOSIS — Z79899 Other long term (current) drug therapy: Secondary | ICD-10-CM | POA: Diagnosis not present

## 2017-06-01 DIAGNOSIS — W1839XA Other fall on same level, initial encounter: Secondary | ICD-10-CM | POA: Diagnosis not present

## 2017-06-01 DIAGNOSIS — S0012XA Contusion of left eyelid and periocular area, initial encounter: Secondary | ICD-10-CM | POA: Diagnosis not present

## 2017-06-01 DIAGNOSIS — S0990XA Unspecified injury of head, initial encounter: Secondary | ICD-10-CM | POA: Diagnosis not present

## 2017-06-01 DIAGNOSIS — S0011XA Contusion of right eyelid and periocular area, initial encounter: Secondary | ICD-10-CM | POA: Diagnosis not present

## 2017-06-01 DIAGNOSIS — Z794 Long term (current) use of insulin: Secondary | ICD-10-CM | POA: Diagnosis not present

## 2017-06-01 DIAGNOSIS — S0003XA Contusion of scalp, initial encounter: Secondary | ICD-10-CM | POA: Diagnosis not present

## 2017-06-01 DIAGNOSIS — G4489 Other headache syndrome: Secondary | ICD-10-CM | POA: Diagnosis not present

## 2017-06-01 DIAGNOSIS — Z7982 Long term (current) use of aspirin: Secondary | ICD-10-CM | POA: Diagnosis not present

## 2017-10-02 DIAGNOSIS — T1490XA Injury, unspecified, initial encounter: Secondary | ICD-10-CM | POA: Diagnosis not present

## 2017-10-02 DIAGNOSIS — W19XXXA Unspecified fall, initial encounter: Secondary | ICD-10-CM | POA: Diagnosis not present

## 2017-10-08 DIAGNOSIS — E1165 Type 2 diabetes mellitus with hyperglycemia: Secondary | ICD-10-CM | POA: Diagnosis not present

## 2017-11-03 DIAGNOSIS — E785 Hyperlipidemia, unspecified: Secondary | ICD-10-CM | POA: Diagnosis not present

## 2017-11-03 DIAGNOSIS — E1169 Type 2 diabetes mellitus with other specified complication: Secondary | ICD-10-CM | POA: Diagnosis not present

## 2017-11-03 DIAGNOSIS — N390 Urinary tract infection, site not specified: Secondary | ICD-10-CM | POA: Diagnosis not present

## 2017-11-04 DIAGNOSIS — Z79899 Other long term (current) drug therapy: Secondary | ICD-10-CM | POA: Diagnosis not present

## 2017-11-04 DIAGNOSIS — N3000 Acute cystitis without hematuria: Secondary | ICD-10-CM | POA: Diagnosis not present

## 2017-11-04 DIAGNOSIS — E1159 Type 2 diabetes mellitus with other circulatory complications: Secondary | ICD-10-CM | POA: Diagnosis not present

## 2017-11-04 DIAGNOSIS — E785 Hyperlipidemia, unspecified: Secondary | ICD-10-CM | POA: Diagnosis not present

## 2017-11-05 DIAGNOSIS — R410 Disorientation, unspecified: Secondary | ICD-10-CM | POA: Diagnosis not present

## 2017-11-05 DIAGNOSIS — R52 Pain, unspecified: Secondary | ICD-10-CM | POA: Diagnosis not present

## 2017-11-05 DIAGNOSIS — R9431 Abnormal electrocardiogram [ECG] [EKG]: Secondary | ICD-10-CM | POA: Diagnosis not present

## 2017-11-05 DIAGNOSIS — S0990XA Unspecified injury of head, initial encounter: Secondary | ICD-10-CM | POA: Diagnosis not present

## 2017-11-05 DIAGNOSIS — R739 Hyperglycemia, unspecified: Secondary | ICD-10-CM | POA: Diagnosis not present

## 2017-11-05 DIAGNOSIS — W01198A Fall on same level from slipping, tripping and stumbling with subsequent striking against other object, initial encounter: Secondary | ICD-10-CM | POA: Diagnosis not present

## 2017-11-05 DIAGNOSIS — R404 Transient alteration of awareness: Secondary | ICD-10-CM | POA: Diagnosis not present

## 2017-11-05 DIAGNOSIS — S0093XA Contusion of unspecified part of head, initial encounter: Secondary | ICD-10-CM | POA: Diagnosis not present

## 2017-11-05 DIAGNOSIS — R0902 Hypoxemia: Secondary | ICD-10-CM | POA: Diagnosis not present

## 2017-11-05 DIAGNOSIS — Z794 Long term (current) use of insulin: Secondary | ICD-10-CM | POA: Diagnosis not present

## 2017-11-05 DIAGNOSIS — E1165 Type 2 diabetes mellitus with hyperglycemia: Secondary | ICD-10-CM | POA: Diagnosis not present

## 2017-11-17 DIAGNOSIS — E1129 Type 2 diabetes mellitus with other diabetic kidney complication: Secondary | ICD-10-CM | POA: Diagnosis not present

## 2017-11-17 DIAGNOSIS — N3001 Acute cystitis with hematuria: Secondary | ICD-10-CM | POA: Diagnosis not present

## 2017-11-17 DIAGNOSIS — G309 Alzheimer's disease, unspecified: Secondary | ICD-10-CM | POA: Diagnosis not present

## 2017-11-17 DIAGNOSIS — B372 Candidiasis of skin and nail: Secondary | ICD-10-CM | POA: Diagnosis not present

## 2017-12-04 DIAGNOSIS — E1129 Type 2 diabetes mellitus with other diabetic kidney complication: Secondary | ICD-10-CM | POA: Diagnosis not present

## 2017-12-04 DIAGNOSIS — G309 Alzheimer's disease, unspecified: Secondary | ICD-10-CM | POA: Diagnosis not present

## 2017-12-04 DIAGNOSIS — Z23 Encounter for immunization: Secondary | ICD-10-CM | POA: Diagnosis not present

## 2018-01-06 DIAGNOSIS — G309 Alzheimer's disease, unspecified: Secondary | ICD-10-CM | POA: Diagnosis not present

## 2018-01-06 DIAGNOSIS — W19XXXA Unspecified fall, initial encounter: Secondary | ICD-10-CM | POA: Diagnosis not present

## 2018-01-06 DIAGNOSIS — E1129 Type 2 diabetes mellitus with other diabetic kidney complication: Secondary | ICD-10-CM | POA: Diagnosis not present

## 2018-01-06 DIAGNOSIS — R5381 Other malaise: Secondary | ICD-10-CM | POA: Diagnosis not present

## 2018-01-06 DIAGNOSIS — Z23 Encounter for immunization: Secondary | ICD-10-CM | POA: Diagnosis not present

## 2018-02-02 DIAGNOSIS — I10 Essential (primary) hypertension: Secondary | ICD-10-CM | POA: Diagnosis not present

## 2018-02-02 DIAGNOSIS — S0003XA Contusion of scalp, initial encounter: Secondary | ICD-10-CM | POA: Diagnosis not present

## 2018-02-02 DIAGNOSIS — R52 Pain, unspecified: Secondary | ICD-10-CM | POA: Diagnosis not present

## 2018-02-02 DIAGNOSIS — Z86011 Personal history of benign neoplasm of the brain: Secondary | ICD-10-CM | POA: Diagnosis not present

## 2018-02-02 DIAGNOSIS — S199XXA Unspecified injury of neck, initial encounter: Secondary | ICD-10-CM | POA: Diagnosis not present

## 2018-02-02 DIAGNOSIS — Z743 Need for continuous supervision: Secondary | ICD-10-CM | POA: Diagnosis not present

## 2018-02-02 DIAGNOSIS — E1165 Type 2 diabetes mellitus with hyperglycemia: Secondary | ICD-10-CM | POA: Diagnosis not present

## 2018-02-02 DIAGNOSIS — Z794 Long term (current) use of insulin: Secondary | ICD-10-CM | POA: Diagnosis not present

## 2018-02-02 DIAGNOSIS — E119 Type 2 diabetes mellitus without complications: Secondary | ICD-10-CM | POA: Diagnosis not present

## 2018-02-02 DIAGNOSIS — I499 Cardiac arrhythmia, unspecified: Secondary | ICD-10-CM | POA: Diagnosis not present

## 2018-02-02 DIAGNOSIS — Z885 Allergy status to narcotic agent status: Secondary | ICD-10-CM | POA: Diagnosis not present

## 2018-02-02 DIAGNOSIS — S0990XA Unspecified injury of head, initial encounter: Secondary | ICD-10-CM | POA: Diagnosis not present

## 2018-02-02 DIAGNOSIS — Z880 Allergy status to penicillin: Secondary | ICD-10-CM | POA: Diagnosis not present

## 2018-02-02 DIAGNOSIS — N390 Urinary tract infection, site not specified: Secondary | ICD-10-CM | POA: Diagnosis not present

## 2018-02-02 DIAGNOSIS — W1789XA Other fall from one level to another, initial encounter: Secondary | ICD-10-CM | POA: Diagnosis not present

## 2018-02-17 DIAGNOSIS — T1490XA Injury, unspecified, initial encounter: Secondary | ICD-10-CM | POA: Diagnosis not present

## 2018-02-17 DIAGNOSIS — R5381 Other malaise: Secondary | ICD-10-CM | POA: Diagnosis not present

## 2018-02-17 DIAGNOSIS — W19XXXA Unspecified fall, initial encounter: Secondary | ICD-10-CM | POA: Diagnosis not present

## 2018-03-24 DIAGNOSIS — I10 Essential (primary) hypertension: Secondary | ICD-10-CM | POA: Diagnosis not present

## 2018-03-24 DIAGNOSIS — G63 Polyneuropathy in diseases classified elsewhere: Secondary | ICD-10-CM | POA: Diagnosis not present

## 2018-03-24 DIAGNOSIS — E782 Mixed hyperlipidemia: Secondary | ICD-10-CM | POA: Diagnosis not present

## 2018-03-24 DIAGNOSIS — I688 Other cerebrovascular disorders in diseases classified elsewhere: Secondary | ICD-10-CM | POA: Diagnosis not present

## 2018-04-07 ENCOUNTER — Other Ambulatory Visit: Payer: Self-pay

## 2018-04-07 ENCOUNTER — Emergency Department (HOSPITAL_COMMUNITY)
Admission: EM | Admit: 2018-04-07 | Discharge: 2018-04-07 | Disposition: A | Discharge status: Home / Self Care | Payer: Medicare Other | Attending: Emergency Medicine | Admitting: Emergency Medicine

## 2018-04-07 ENCOUNTER — Emergency Department (HOSPITAL_COMMUNITY): Payer: Medicare Other

## 2018-04-07 ENCOUNTER — Encounter (HOSPITAL_COMMUNITY): Payer: Self-pay | Admitting: Emergency Medicine

## 2018-04-07 DIAGNOSIS — Z91040 Latex allergy status: Secondary | ICD-10-CM | POA: Diagnosis not present

## 2018-04-07 DIAGNOSIS — Z85038 Personal history of other malignant neoplasm of large intestine: Secondary | ICD-10-CM | POA: Diagnosis not present

## 2018-04-07 DIAGNOSIS — G20 Parkinson's disease: Secondary | ICD-10-CM | POA: Diagnosis not present

## 2018-04-07 DIAGNOSIS — L03115 Cellulitis of right lower limb: Secondary | ICD-10-CM | POA: Diagnosis not present

## 2018-04-07 DIAGNOSIS — F329 Major depressive disorder, single episode, unspecified: Secondary | ICD-10-CM | POA: Insufficient documentation

## 2018-04-07 DIAGNOSIS — E1165 Type 2 diabetes mellitus with hyperglycemia: Secondary | ICD-10-CM | POA: Insufficient documentation

## 2018-04-07 DIAGNOSIS — Z951 Presence of aortocoronary bypass graft: Secondary | ICD-10-CM | POA: Diagnosis not present

## 2018-04-07 DIAGNOSIS — Z9049 Acquired absence of other specified parts of digestive tract: Secondary | ICD-10-CM | POA: Insufficient documentation

## 2018-04-07 DIAGNOSIS — J45909 Unspecified asthma, uncomplicated: Secondary | ICD-10-CM | POA: Diagnosis not present

## 2018-04-07 DIAGNOSIS — I2510 Atherosclerotic heart disease of native coronary artery without angina pectoris: Secondary | ICD-10-CM | POA: Insufficient documentation

## 2018-04-07 DIAGNOSIS — Z794 Long term (current) use of insulin: Secondary | ICD-10-CM | POA: Insufficient documentation

## 2018-04-07 DIAGNOSIS — I10 Essential (primary) hypertension: Secondary | ICD-10-CM | POA: Insufficient documentation

## 2018-04-07 DIAGNOSIS — Z96659 Presence of unspecified artificial knee joint: Secondary | ICD-10-CM | POA: Insufficient documentation

## 2018-04-07 DIAGNOSIS — F419 Anxiety disorder, unspecified: Secondary | ICD-10-CM | POA: Diagnosis not present

## 2018-04-07 DIAGNOSIS — Z79899 Other long term (current) drug therapy: Secondary | ICD-10-CM | POA: Diagnosis not present

## 2018-04-07 DIAGNOSIS — R739 Hyperglycemia, unspecified: Secondary | ICD-10-CM | POA: Diagnosis present

## 2018-04-07 DIAGNOSIS — M7989 Other specified soft tissue disorders: Secondary | ICD-10-CM | POA: Diagnosis not present

## 2018-04-07 LAB — BASIC METABOLIC PANEL
Anion gap: 13 (ref 5–15)
BUN: 13 mg/dL (ref 8–23)
CALCIUM: 8.9 mg/dL (ref 8.9–10.3)
CO2: 27 mmol/L (ref 22–32)
Chloride: 93 mmol/L — ABNORMAL LOW (ref 98–111)
Creatinine, Ser: 0.85 mg/dL (ref 0.44–1.00)
GFR calc Af Amer: >60 mL/min (ref >60)
GFR calc non Af Amer: >60 mL/min (ref >60)
GLUCOSE: 501 mg/dL — AB (ref 70–99)
Potassium: 3.3 mmol/L — ABNORMAL LOW (ref 3.5–5.1)
SODIUM: 133 mmol/L — AB (ref 135–145)

## 2018-04-07 LAB — BLOOD GAS, VENOUS
ACID-BASE EXCESS: 5.7 mmol/L — AB (ref 0.0–2.0)
Bicarbonate: 27.6 mmol/L (ref 20.0–28.0)
FIO2: 21
O2 Saturation: 52.5 %
PATIENT TEMPERATURE: 37
pCO2, Ven: 47.2 mmHg (ref 44.0–60.0)
pH, Ven: 7.42 (ref 7.250–7.430)
pO2, Ven: 34.8 mmHg (ref 32.0–45.0)

## 2018-04-07 LAB — CBC
HCT: 41 % (ref 36.0–46.0)
Hemoglobin: 13.9 g/dL (ref 12.0–15.0)
MCH: 29.3 pg (ref 26.0–34.0)
MCHC: 33.9 g/dL (ref 30.0–36.0)
MCV: 86.5 fL (ref 80.0–100.0)
PLATELETS: 298 10*3/uL (ref 150–400)
RBC: 4.74 MIL/uL (ref 3.87–5.11)
RDW: 13.2 % (ref 11.5–15.5)
WBC: 5.7 10*3/uL (ref 4.0–10.5)
nRBC: 0 % (ref 0.0–0.2)

## 2018-04-07 LAB — CBG MONITORING, ED
Glucose-Capillary: 413 mg/dL — ABNORMAL HIGH (ref 70–99)
Glucose-Capillary: 261 mg/dL — ABNORMAL HIGH (ref 70–99)

## 2018-04-07 MED ORDER — DOXYCYCLINE HYCLATE 100 MG PO TABS: 100.0000 mg | ORAL_TABLET | Freq: Two times a day (BID) | ORAL | 0 refills | Status: AC

## 2018-04-07 MED ORDER — SODIUM CHLORIDE 0.9 % IV SOLN: 999.0000 mL/h | INTRAVENOUS | Status: AC

## 2018-04-07 MED ORDER — SODIUM CHLORIDE 0.9 % IV SOLN
1000.0000 mL | INTRAVENOUS | Status: DC
  Administered 2018-04-07: 1000 mL via INTRAVENOUS

## 2018-04-07 MED ORDER — SODIUM CHLORIDE 0.9 % IV BOLUS (SEPSIS)
500.0000 mL | Freq: Once | INTRAVENOUS | Status: AC
  Administered 2018-04-07: 500 mL via INTRAVENOUS

## 2018-04-07 MED ORDER — INSULIN REGULAR(HUMAN) IN NACL 100-0.9 UT/100ML-% IV SOLN
INTRAVENOUS | Status: DC
  Administered 2018-04-07: 3.5 [IU]/h via INTRAVENOUS
  Filled 2018-04-07: qty 100

## 2018-04-07 MED ORDER — LEVOFLOXACIN IN D5W 500 MG/100ML IV SOLN
500.0000 mg | Freq: Once | INTRAVENOUS | Status: AC
  Administered 2018-04-07: 500 mg via INTRAVENOUS
  Filled 2018-04-07: qty 100

## 2018-04-07 NOTE — ED Triage Notes (Signed)
Patient sent from Dr Willey Blade for CBG of 501 and swollen right foot with blister on it. Adult Protective Services with patient states patient is not taking her medication as directed.

## 2018-04-07 NOTE — Discharge Instructions (Addendum)
Continue to take your medications, follow-up with your doctor to check on your blood sugar, make sure to take the antibiotics until they are complete.

## 2018-04-07 NOTE — ED Notes (Signed)
Date and time results received: 04/07/18 3:23 PM  (use smartphrase ".now" to insert current time)  Test: glucose Critical Value: 501  Name of Provider Notified: Tomi Bamberger  Orders Received? Or Actions Taken?: None received at this time.

## 2018-04-07 NOTE — ED Provider Notes (Signed)
[facility] EMERGENCY DEPARTMENT Provider Note   CSN: [id number] Arrival date & time: 04/07/18  1303     History   Chief Complaint Chief Complaint  Patient presents with  . Hyperglycemia    HPI [name] is a 73 y.o. female.  HPI Patient presents to the emergency room for evaluation of hyperglycemia and findings concerning for cellulitis.  Has history of diabetes.  She lives at home by herself.  Adult Protective Services have been involved.  According to the social worker there is been concerned that the patient has not capable of caring for herself.  Currently she does not have a protective order in place.  She went to her doctor's office today.  Patient was noted to have a very elevated blood sugar.  She also has evidence of redness and swelling around her foot.  Patient states her foot is tender to the touch.  She has a blister on the outside of her foot that she states is getting better.  No known fevers or chills.  No vomiting or diarrhea.  Past Medical History:  Diagnosis Date  . Acoustic neuroma (HCC)    left ear  . Adenocarcinoma ([place])    ascending colon, LNs pos -- S/P right hemicolectomy, xeloda completed 6/10 (dr. Sonny Dandy)  . Anginal pain ([place])    DR Theola Sequin    . Anxiety   . Asthma    AS CHILD   . Blurred vision   . Coronary artery disease    a. s/p stent to lad 2006 w subsequent kissing balloon pci to diagonal;  b.  pci of the lad w a drug-eluting stent July 2010;  c.  LHC 11/13/11: p-mLAD stent with prox 50% ISR, 80% beyond dist edge of stent, m+dLAD beyond stent 40-50%, mod caliber pD1 80%, mRCA 70%, m-dRCA 20-30%, EF 55-65% => med Rx (consider CABG)  . Depressed   . Diabetes mellitus   . Dyslipidemia   . Fibromyalgia   . Gastroparesis   . GERD (gastroesophageal reflux disease)   . Headache(784.0)   . Hiatal hernia   . Hypertension   . Morbid obesity ([place])   . Osteoarthritis   . Parkinson disease ([place])    ambulates with cane  . Sleep apnea     12+ YRS NO MACHINE GOTTEN   . Varicose vein       Past Surgical History:  Procedure Laterality Date  . ABDOMINAL HYSTERECTOMY  1993  . APPENDECTOMY    . APPLICATION OF WOUND VAC N/A 03/29/2013   Procedure: APPLICATION OF WOUND VAC;  Surgeon: Jamesetta So, MD;  Location[facility];  Service: General;  Laterality: N/A;  . BACK SURGERY    . CARDIAC CATHETERIZATION    . CATARACT EXTRACTION    . CHOLECYSTECTOMY  1966  . COLON SURGERY    . COLONOSCOPY N/A 09/21/2013   Procedure: COLONOSCOPY;  Surgeon: Inda Castle, MD;  [facility];  Service: Endoscopy;  Laterality: N/A;  . CORONARY ARTERY BYPASS GRAFT  01/20/2012   Procedure: CORONARY ARTERY BYPASS GRAFTING (CABG);  Surgeon: Gaye Pollack, MD;  Location: [place];  Service: Open Heart Surgery;  Laterality: N/A;  Coronary artery bypass graft times three utilizing the left internal mammary artery and the right greater saphenous vein harvested endoscopically  . ENDOVEIN HARVEST OF GREATER SAPHENOUS VEIN  01/20/2012   Procedure: ENDOVEIN HARVEST OF GREATER SAPHENOUS VEIN;  Surgeon: Gaye Pollack, MD;  Location: [place];  Service: Open Heart Surgery;  Laterality: Right;  . ESOPHAGOGASTRODUODENOSCOPY N/A 04/19/2014   Procedure: ESOPHAGOGASTRODUODENOSCOPY (EGD);  Surgeon: Jamesetta So, MD;  [facility];  Service: Gastroenterology;  Laterality: N/A;  . EXCISION MORTON'S NEUROMA    . EYE SURGERY     cataract on right 2005, left 2008  . FETAL SURGERY FOR CONGENITAL HERNIA    . FINGER REPLANTATION     RECENTLY   . HEMICOLECTOMY  12/28/07   right for adenocarcinoma of ascending colon  . [place], 2002, 2007   open x1, lap x2   . herniaorraphy  01/22/06   for recurrent ventral hernia  . HYSTEROTOMY  1993  . INCISION AND DRAINAGE ABSCESS N/A 03/29/2013   Procedure: INCISION AND DRAINAGE ABDOMINAL WALL ABSCESS;  Surgeon: Jamesetta So, MD;  Location: [facility];  Service: General;  Laterality: N/A;  . JOINT REPLACEMENT   2005 and 2006   right 2005, left 2006  . KNEE ARTHROSCOPY    . LAPAROTOMY     for small bowl obstruction  . LEFT HEART CATHETERIZATION WITH CORONARY ANGIOGRAM N/A 11/13/2011   Procedure: LEFT HEART CATHETERIZATION WITH CORONARY ANGIOGRAM;  Surgeon: Sherren Mocha, MD;  Location: [facility] CATH LAB;  Service: Cardiovascular;  Laterality: N/A;  . LESION REMOVAL  10/08/2011   Procedure: LESION REMOVAL;  Surgeon: Sanjuana Kava, MD;  Location: [facility];  Service: Orthopedics;  Laterality: Right;  Removal Lesion Right Long Finger  . MANDIBLE SURGERY    . PORT-A-CATH REMOVAL Right 06/17/2014   Procedure: REMOVAL PORT-A-CATH;  Surgeon: Aviva Signs Md, MD;  Location: [facility];  Service: General;  Laterality: Right;  . PORTACATH PLACEMENT    . REPLACEMENT TOTAL KNEE    . resecton of acoustic neuroma    . stent surgery    . TONSILLECTOMY       OB History    Gravida      Para      Term      Preterm      AB      Living  0     SAB      TAB      Ectopic      Multiple      Live Births               Home Medications    Prior to Admission medications   Medication Sig Start Date End Date Taking? Authorizing Provider  donepezil (ARICEPT) 5 MG tablet Take 5 mg by mouth daily.    Yes [provider]  HUMALOG KWIKPEN 100 UNIT/ML KiwkPen Inject 10 Units into the skin 3 (three) times daily.  05/02/16  Yes [provider]  insulin detemir (LEVEMIR) 100 UNIT/ML injection Inject 70 Units into the skin every morning.    Yes [provider]  doxycycline (VIBRA-TABS) 100 MG tablet Take 1 tablet (100 mg total) by mouth 2 (two) times daily for 10 days. 04/07/18 04/17/18  Dorie Rank, MD    Family History Family History  Problem Relation Age of Onset  . Diabetes Maternal Grandmother        unspecif if Maternal or Paternal  . Heart failure Mother   . Cirrhosis Father   . Coronary artery disease Other        family history  . Asthma Other        family history-grandfather   . Colon cancer Other        family hx    Social History Social History  Tobacco Use  . Smoking status: Never Smoker  . Smokeless tobacco: Never Used  Substance Use Topics  . Alcohol use: No    Comment: occasional  . Drug use: No     Allergies   Latex; Morphine; Adhesive [tape]; Codeine; and Penicillins   Review of Systems Review of Systems  All other systems reviewed and are negative.    Physical Exam Updated Vital Signs BP (!) 173/71   Pulse 72   Temp 98.6 F (37 C) (Oral)   Resp 20   Wt 81.6 kg   SpO2 100%   BMI 29.95 kg/m   Physical Exam Vitals signs and nursing note reviewed.  Constitutional:      General: She is not in acute distress.    Appearance: She is well-developed. She is not ill-appearing.  HENT:     Head: Normocephalic and atraumatic.     Right Ear: External ear normal.     Left Ear: External ear normal.  Eyes:     General: No scleral icterus.       Right eye: No discharge.        Left eye: No discharge.     Conjunctiva/sclera: Conjunctivae normal.  Neck:     Musculoskeletal: Neck supple.     Trachea: No tracheal deviation.  Cardiovascular:     Rate and Rhythm: Normal rate and regular rhythm.  Pulmonary:     Effort: Pulmonary effort is normal. No respiratory distress.     Breath sounds: Normal breath sounds. No stridor. No wheezing or rales.  Abdominal:     General: Bowel sounds are normal. There is no distension.     Palpations: Abdomen is soft.     Tenderness: There is no abdominal tenderness. There is no guarding or rebound.  Musculoskeletal:        General: No tenderness.     Comments: Erythema of the right foot, ttp, 1-2 cm raised intact blister along lateral aspect of 5 mtp joint  Skin:    General: Skin is warm and dry.     Findings: No rash.     Comments: Poor hygeine noted on feet, dirt or ?stool between toes  Neurological:     Mental Status: She is alert.     Cranial Nerves: No cranial nerve deficit (no facial droop,  extraocular movements intact, no slurred speech).     Sensory: No sensory deficit.     Motor: No abnormal muscle tone or seizure activity.     Coordination: Coordination normal.      ED Treatments / Results  Labs (all labs ordered are listed, but only abnormal results are displayed) Labs Reviewed  BASIC METABOLIC PANEL - Abnormal; Notable for the following components:      Result Value   Sodium 133 (*)    Potassium 3.3 (*)    Chloride 93 (*)    Glucose, Bld 501 (*)    All other components within normal limits  BLOOD GAS, VENOUS - Abnormal; Notable for the following components:   Acid-Base Excess 5.7 (*)    All other components within normal limits  CBG MONITORING, ED - Abnormal; Notable for the following components:   Glucose-Capillary 413 (*)    All other components within normal limits  CBG MONITORING, ED - Abnormal; Notable for the following components:   Glucose-Capillary 261 (*)    All other components within normal limits  CBC    EKG None  Radiology Dg Foot Complete Right  Result Date: 04/07/2018 CLINICAL DATA:  Diabetic with acute hyperglycemia (blood glucose 501) and acute RIGHT foot swelling and blistering. No room known injuries. EXAM: RIGHT FOOT COMPLETE - 3+ VIEW COMPARISON:  None. FINDINGS: Diffuse soft tissue swelling. Osseous demineralization. No evidence of acute, subacute or healed fractures. No evidence of osteomyelitis. Mild-to-moderate narrowing of the IP joint spaces of the toes. Remaining joint spaces well preserved for age. Large plantar calcaneal spur. IMPRESSION: No acute or subacute osseous abnormality. Osteoarthritis involving the IP joints of the toes. Osseous demineralization. Electronically Signed   By: Evangeline Dakin M.D.   On: 04/07/2018 15:24    Procedures Procedures (including critical care time)  Medications Ordered in ED Medications  sodium chloride 0.9 % bolus 500 mL (0 mLs Intravenous Stopped 04/07/18 1531)    Followed by  0.9 %   sodium chloride infusion (1,000 mLs Intravenous New Bag/Given 04/07/18 1532)  0.9 %  sodium chloride infusion (has no administration in time range)  insulin regular, human (MYXREDLIN) 100 units/ 100 mL infusion (3.5 Units/hr Intravenous New Bag/Given 04/07/18 1555)  levofloxacin (LEVAQUIN) IVPB 500 mg (500 mg Intravenous New Bag/Given 04/07/18 1610)     Initial Impression / Assessment and Plan / ED Course  I have reviewed the triage vital signs and the nursing notes.  Pertinent labs & imaging results that were available during my care of the patient were reviewed by me and considered in my medical decision making (see chart for details).  Clinical Course as of Apr 07 1748  Tue Apr 07, 2018  1535 Hyperglycemia noted.  No signs of DKA.  We will continue with IV hydration and insulin.   [EH]  6314 IV abx ordered for cellulitis.     [JK]    Clinical Course User Index [JK] Dorie Rank, MD    Patient presented to the emergency room for evaluation of hyperglycemia and probable cellulitis of her lower extremity.  Patient appeared stable in the emergency room.  She is afebrile and nontoxic.  She does not have a leukocytosis.  Strays do not show any acute abnormality.  Patient's blood sugar improved with insulin and fluids.  Patient does not appear to need hospitalization for her cellulitis.  Her hyperglycemia has been treated.  Patient likely has some issues with her med compliance.  Patient is followed by Adult Protective Services.  She does have a primary doctor that she is able to follow-up.  Patient appears stable for discharge with close outpatient follow-up.  Final Clinical Impressions(s) / ED Diagnoses   Final diagnoses:  Hyperglycemia  Cellulitis of right lower extremity    ED Discharge Orders         Ordered    doxycycline (VIBRA-TABS) 100 MG tablet  2 times daily     04/07/18 1748           Dorie Rank, MD 04/07/18 1751

## 2018-04-10 ENCOUNTER — Other Ambulatory Visit: Payer: Self-pay

## 2018-04-10 NOTE — Patient Outreach (Signed)
[facility]) Care Management  04/10/2018  Kalifa Cadden Packman 02-14-1946 [id number]   Referral Date: 04/10/2018 Referral Source: [facility] referral Referral Reason: Patient confusion, hyperglycemia, ED visit 04-07-2018.     Outreach Attempt: No answer. HIPAA compliant voice message left.   Plan: RN CM will attempt patient again within 4 business days and send letter.   Jone Baseman, RN, MSN [facility] Care Management Care Management Coordinator Direct Line [phone] Toll Free: [phone]  Fax: [phone]

## 2018-04-13 ENCOUNTER — Other Ambulatory Visit: Payer: Self-pay

## 2018-04-13 NOTE — Patient Outreach (Signed)
[place] [facility]) Care Management  04/13/2018  [name] [birth year] [id number]   Referral Date: 04/10/2018 Referral Source: [facility] referral Referral Reason: Patient confusion, hyperglycemia, ED visit 04-07-2018.     Outreach Attempt: No answer. HIPAA compliant voice message left.   Plan: RN CM will attempt patient again within 4 business days.  Jone Baseman, RN, MSN [place] Management Care Management Coordinator Direct Line [phone] Cell [phone] Toll Free: [phone]  Fax: [phone]

## 2018-04-14 ENCOUNTER — Other Ambulatory Visit: Payer: Self-pay

## 2018-04-14 NOTE — Patient Outreach (Signed)
[facility]) Care Management  04/14/2018  Suriah Peragine Wojtas February 23, 1946 [id number]   Referral Date:04/10/2018 Referral Source:[facility] referral Referral Reason:Patient confusion, hyperglycemia, ED visit 04-07-2018.   Outreach Attempt:No answer. HIPAA compliant voice message left.   Plan:RN CM will wait return call.  If no return call will close case.    Jone Baseman, RN, MSN [place] Management Care Management Coordinator Direct Line [phone] Cell [phone] Toll Free: [phone]  Fax: [phone]

## 2018-04-16 DIAGNOSIS — R42 Dizziness and giddiness: Secondary | ICD-10-CM | POA: Diagnosis not present

## 2018-04-24 ENCOUNTER — Other Ambulatory Visit: Payer: Self-pay

## 2018-04-24 NOTE — Patient Outreach (Signed)
[facility]) Care Management  04/24/2018  Kemora Pinard [place] 12/08/1945 [id number]   Multiple attempts to establish contact with patient without success. No response from letter mailed to patient.   Plan: RN CM will close case at this time.   Jone Baseman, RN, MSN [place] Management Care Management Coordinator Direct Line [phone] Cell [phone] Toll Free: [phone]  Fax: [phone]

## 2018-05-18 DIAGNOSIS — Z880 Allergy status to penicillin: Secondary | ICD-10-CM | POA: Diagnosis not present

## 2018-05-18 DIAGNOSIS — E1365 Other specified diabetes mellitus with hyperglycemia: Secondary | ICD-10-CM | POA: Diagnosis not present

## 2018-05-18 DIAGNOSIS — I10 Essential (primary) hypertension: Secondary | ICD-10-CM | POA: Diagnosis not present

## 2018-05-18 DIAGNOSIS — E1165 Type 2 diabetes mellitus with hyperglycemia: Secondary | ICD-10-CM | POA: Diagnosis not present

## 2018-05-18 DIAGNOSIS — R410 Disorientation, unspecified: Secondary | ICD-10-CM | POA: Diagnosis not present

## 2018-05-18 DIAGNOSIS — Z794 Long term (current) use of insulin: Secondary | ICD-10-CM | POA: Diagnosis not present

## 2018-05-18 DIAGNOSIS — Z885 Allergy status to narcotic agent status: Secondary | ICD-10-CM | POA: Diagnosis not present

## 2018-05-18 DIAGNOSIS — Z888 Allergy status to other drugs, medicaments and biological substances status: Secondary | ICD-10-CM | POA: Diagnosis not present

## 2018-05-20 DIAGNOSIS — I25119 Atherosclerotic heart disease of native coronary artery with unspecified angina pectoris: Secondary | ICD-10-CM | POA: Diagnosis not present

## 2018-05-20 DIAGNOSIS — E1151 Type 2 diabetes mellitus with diabetic peripheral angiopathy without gangrene: Secondary | ICD-10-CM | POA: Diagnosis not present

## 2018-05-21 DIAGNOSIS — K219 Gastro-esophageal reflux disease without esophagitis: Secondary | ICD-10-CM | POA: Diagnosis not present

## 2018-05-22 ENCOUNTER — Emergency Department (HOSPITAL_COMMUNITY)
Admission: EM | Admit: 2018-05-22 | Discharge: 2018-05-23 | Disposition: A | Discharge status: Skilled Nursing Facility | Payer: Medicare Other | Attending: Emergency Medicine | Admitting: Emergency Medicine

## 2018-05-22 ENCOUNTER — Encounter (HOSPITAL_COMMUNITY): Payer: Self-pay | Admitting: Emergency Medicine

## 2018-05-22 ENCOUNTER — Other Ambulatory Visit: Payer: Self-pay

## 2018-05-22 DIAGNOSIS — Z96651 Presence of right artificial knee joint: Secondary | ICD-10-CM | POA: Insufficient documentation

## 2018-05-22 DIAGNOSIS — Z79899 Other long term (current) drug therapy: Secondary | ICD-10-CM | POA: Diagnosis not present

## 2018-05-22 DIAGNOSIS — F419 Anxiety disorder, unspecified: Secondary | ICD-10-CM | POA: Diagnosis present

## 2018-05-22 DIAGNOSIS — I110 Hypertensive heart disease with heart failure: Secondary | ICD-10-CM | POA: Insufficient documentation

## 2018-05-22 DIAGNOSIS — Z951 Presence of aortocoronary bypass graft: Secondary | ICD-10-CM | POA: Insufficient documentation

## 2018-05-22 DIAGNOSIS — Z955 Presence of coronary angioplasty implant and graft: Secondary | ICD-10-CM | POA: Diagnosis not present

## 2018-05-22 DIAGNOSIS — E782 Mixed hyperlipidemia: Secondary | ICD-10-CM | POA: Diagnosis not present

## 2018-05-22 DIAGNOSIS — I5032 Chronic diastolic (congestive) heart failure: Secondary | ICD-10-CM | POA: Insufficient documentation

## 2018-05-22 DIAGNOSIS — E119 Type 2 diabetes mellitus without complications: Secondary | ICD-10-CM | POA: Diagnosis not present

## 2018-05-22 DIAGNOSIS — E1165 Type 2 diabetes mellitus with hyperglycemia: Secondary | ICD-10-CM | POA: Diagnosis not present

## 2018-05-22 DIAGNOSIS — G20 Parkinson's disease: Secondary | ICD-10-CM | POA: Insufficient documentation

## 2018-05-22 DIAGNOSIS — Z794 Long term (current) use of insulin: Secondary | ICD-10-CM | POA: Insufficient documentation

## 2018-05-22 DIAGNOSIS — R451 Restlessness and agitation: Secondary | ICD-10-CM | POA: Diagnosis not present

## 2018-05-22 DIAGNOSIS — Z91040 Latex allergy status: Secondary | ICD-10-CM | POA: Diagnosis not present

## 2018-05-22 DIAGNOSIS — D649 Anemia, unspecified: Secondary | ICD-10-CM | POA: Diagnosis not present

## 2018-05-22 DIAGNOSIS — J45909 Unspecified asthma, uncomplicated: Secondary | ICD-10-CM | POA: Insufficient documentation

## 2018-05-22 DIAGNOSIS — E039 Hypothyroidism, unspecified: Secondary | ICD-10-CM | POA: Diagnosis not present

## 2018-05-22 DIAGNOSIS — I2510 Atherosclerotic heart disease of native coronary artery without angina pectoris: Secondary | ICD-10-CM | POA: Diagnosis not present

## 2018-05-22 LAB — COMPREHENSIVE METABOLIC PANEL
ALT: 16 U/L (ref 0–44)
AST: 19 U/L (ref 15–41)
Albumin: 3.2 g/dL — ABNORMAL LOW (ref 3.5–5.0)
Alkaline Phosphatase: 85 U/L (ref 38–126)
Anion gap: 10 (ref 5–15)
BUN: 15 mg/dL (ref 8–23)
CO2: 27 mmol/L (ref 22–32)
Calcium: 8.8 mg/dL — ABNORMAL LOW (ref 8.9–10.3)
Chloride: 97 mmol/L — ABNORMAL LOW (ref 98–111)
Creatinine, Ser: 0.63 mg/dL (ref 0.44–1.00)
GFR calc Af Amer: >60 mL/min (ref >60)
GFR calc non Af Amer: >60 mL/min (ref >60)
Glucose, Bld: 242 mg/dL — ABNORMAL HIGH (ref 70–99)
POTASSIUM: 3.6 mmol/L (ref 3.5–5.1)
Sodium: 134 mmol/L — ABNORMAL LOW (ref 135–145)
Total Bilirubin: 0.6 mg/dL (ref 0.3–1.2)
Total Protein: 6.3 g/dL — ABNORMAL LOW (ref 6.5–8.1)

## 2018-05-22 LAB — RAPID URINE DRUG SCREEN, HOSP PERFORMED
Amphetamines: NOT DETECTED
BENZODIAZEPINES: POSITIVE — AB
Barbiturates: NOT DETECTED
Cocaine: NOT DETECTED
Opiates: NOT DETECTED
Tetrahydrocannabinol: NOT DETECTED

## 2018-05-22 LAB — CBC WITH DIFFERENTIAL/PLATELET
Abs Immature Granulocytes: 0.04 10*3/uL (ref 0.00–0.07)
Basophils Absolute: 0.1 10*3/uL (ref 0.0–0.1)
Basophils Relative: 1 %
Eosinophils Absolute: 0.1 10*3/uL (ref 0.0–0.5)
Eosinophils Relative: 1 %
HCT: 39.9 % (ref 36.0–46.0)
Hemoglobin: 14 g/dL (ref 12.0–15.0)
Immature Granulocytes: 1 %
LYMPHS ABS: 1.8 10*3/uL (ref 0.7–4.0)
LYMPHS PCT: 27 %
MCH: 30.8 pg (ref 26.0–34.0)
MCHC: 35.1 g/dL (ref 30.0–36.0)
MCV: 87.9 fL (ref 80.0–100.0)
Monocytes Absolute: 0.5 10*3/uL (ref 0.1–1.0)
Monocytes Relative: 7 %
Neutro Abs: 4.3 10*3/uL (ref 1.7–7.7)
Neutrophils Relative %: 63 %
Platelets: 265 10*3/uL (ref 150–400)
RBC: 4.54 MIL/uL (ref 3.87–5.11)
RDW: 13.3 % (ref 11.5–15.5)
WBC: 6.8 10*3/uL (ref 4.0–10.5)
nRBC: 0 % (ref 0.0–0.2)

## 2018-05-22 LAB — ETHANOL

## 2018-05-22 NOTE — ED Notes (Signed)
Pt repeats  if she is going home to [place], that she does not have her car and has been looking for her car keys for the past two days, can she use the phone to check on her dog,

## 2018-05-22 NOTE — ED Triage Notes (Signed)
Pt sent from [facility] for psych eval  [place] [phone]  Per nursing note "attempting to climb out window Always wanting to go home"  Call to [facility] for clarification but call transferred and then on terminal ring  Per Pt, she lives in [place] is not treated well at [facility], was not fed supper and wants to return home near [place] in [place] Dr Willey Blade is her PCP

## 2018-05-22 NOTE — ED Triage Notes (Signed)
PT ADMITTED TO [facility] 05/20/2018

## 2018-05-22 NOTE — ED Notes (Signed)
Meal provided   Pt has eaten meal 100 per cent

## 2018-05-22 NOTE — ED Provider Notes (Signed)
[facility] EMERGENCY DEPARTMENT Provider Note   CSN: [id number] Arrival date & time: 05/22/18  2153    History   Chief Complaint Chief Complaint  Patient presents with  . Medical Clearance    HPI [name] is a 73 y.o. female.     Patient is a 73 year old female with history of coronary artery disease with stent, fibromyalgia, anxiety, Parkinson's.  She apparently moved into [facility] 4 days ago and has been unhappy there.  I am told she informed staff there that she wanted to die, but she denies this to me.  They say that she is attempting to climb out the window so that she can go home.  Patient denies to me she is experiencing any other symptoms.  She tells me she does not have a problem with her current living arrangements, but feels as though they may not like her.  The history is provided by the patient.    Past Medical History:  Diagnosis Date  . Acoustic neuroma (HCC)    left ear  . 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 Sonny Dandy)  . Anginal pain ([place])    DR Theola Sequin    . Anxiety   . Asthma    AS CHILD   . Blurred vision   . Coronary artery disease    a. s/p stent to lad 2006 w subsequent kissing balloon pci to diagonal;  b.  pci of the lad w a drug-eluting stent July 2010;  c.  LHC 11/13/11: p-mLAD stent with prox 50% ISR, 80% beyond dist edge of stent, m+dLAD beyond stent 40-50%, mod caliber pD1 80%, mRCA 70%, m-dRCA 20-30%, EF 55-65% => med Rx (consider CABG)  . Depressed   . Diabetes mellitus   . Dyslipidemia   . Fibromyalgia   . Gastroparesis   . GERD (gastroesophageal reflux disease)   . Headache(784.0)   . Hiatal hernia   . Hypertension   . Morbid obesity ([place])   . Osteoarthritis   . Parkinson disease ([place])    ambulates with cane  . Sleep apnea    12+ YRS NO MACHINE GOTTEN   . Varicose vein     Patient Active Problem List   Diagnosis Date Noted  . Delirium 02/14/2016   . Lactic acid increased 02/14/2016  . Cellulitis 03/26/2013  . Vomiting 02/08/2012  . 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 Recurrent ventral incisional hernia 09/26/2010  . Obesity, Class III, BMI 40-49.9 (morbid obesity) ([place]) 09/26/2010  . HYPERTENSION, BENIGN 02/06/2009  . PERSONAL HISTORY MALIG NEOPLASM LARGE INTESTINE 12/14/2008  . COUGH 10/26/2008  . DIASTOLIC HEART FAILURE, CHRONIC 09/27/2008  . CAD, NATIVE VESSEL 09/14/2008  . CHEST PAIN UNSPECIFIED 09/13/2008  . Malignant neoplasm of ascending colon ([employer]) 12/07/2007  . BENIGN NEOPLASM OF COLON 09/23/2007  . Diabetes ([place]) 09/23/2007  . IRON DEFICIENCY 09/23/2007  . GASTROPARESIS 09/23/2007  . FIBROMYALGIA 09/23/2007  . FECAL OCCULT BLOOD 09/23/2007    Past Surgical History:  Procedure Laterality Date  . ABDOMINAL HYSTERECTOMY  1993  . APPENDECTOMY    . APPLICATION OF WOUND VAC N/A 03/29/2013   Procedure: APPLICATION OF WOUND VAC;  Surgeon: Jamesetta So, MD;  Location: [facility];  Service: General;  Laterality: N/A;  . BACK SURGERY    . CARDIAC CATHETERIZATION    . CATARACT EXTRACTION    . CHOLECYSTECTOMY  1966  . COLON SURGERY    . COLONOSCOPY N/A  09/21/2013   Procedure: COLONOSCOPY;  Surgeon: Inda Castle, MD;  Location: [facility];  Service: Endoscopy;  Laterality: N/A;  . CORONARY ARTERY BYPASS GRAFT  01/20/2012   Procedure: CORONARY ARTERY BYPASS GRAFTING (CABG);  Surgeon: Gaye Pollack, MD;  Location: [facility];  Service: Open Heart Surgery;  Laterality: N/A;  Coronary artery bypass graft times three utilizing the left internal mammary artery and the right greater saphenous vein harvested endoscopically  . ENDOVEIN HARVEST OF GREATER SAPHENOUS VEIN  01/20/2012   Procedure: ENDOVEIN HARVEST OF GREATER SAPHENOUS VEIN;  Surgeon: Gaye Pollack, MD;  Location: [facility];  Service: Open Heart Surgery;  Laterality: Right;  . ESOPHAGOGASTRODUODENOSCOPY N/A 04/19/2014   Procedure: ESOPHAGOGASTRODUODENOSCOPY (EGD);  Surgeon: Jamesetta So, MD;  [facility];  Service: Gastroenterology;  Laterality: N/A;  . EXCISION MORTON'S NEUROMA    . EYE SURGERY     cataract on right 2005, left 2008  . FETAL SURGERY FOR CONGENITAL HERNIA    . FINGER REPLANTATION     RECENTLY   . HEMICOLECTOMY  12/28/07   right for adenocarcinoma of ascending colon  . [place], 2002, 2007   open x1, lap x2   . herniaorraphy  01/22/06   for recurrent ventral hernia  . HYSTEROTOMY  1993  . INCISION AND DRAINAGE ABSCESS N/A 03/29/2013   Procedure: INCISION AND DRAINAGE ABDOMINAL WALL ABSCESS;  Surgeon: Jamesetta So, MD;  Location: [facility];  Service: General;  Laterality: N/A;  . JOINT REPLACEMENT  2005 and 2006   right 2005, left 2006  . KNEE ARTHROSCOPY    . LAPAROTOMY     for small bowl obstruction  . LEFT HEART CATHETERIZATION WITH CORONARY ANGIOGRAM N/A 11/13/2011   Procedure: LEFT HEART CATHETERIZATION WITH CORONARY ANGIOGRAM;  Surgeon: Sherren Mocha, MD;  Location: [facility] CATH LAB;  Service: Cardiovascular;  Laterality: N/A;  . LESION REMOVAL  10/08/2011   Procedure: LESION REMOVAL;  Surgeon: Sanjuana Kava, MD;  Location: [facility];  Service: Orthopedics;  Laterality: Right;  Removal Lesion Right Long Finger  . MANDIBLE SURGERY    . PORT-A-CATH REMOVAL Right 06/17/2014   Procedure: REMOVAL PORT-A-CATH;  Surgeon: Aviva Signs Md, MD;  Location: [facility];  Service: General;  Laterality: Right;  . PORTACATH PLACEMENT    . REPLACEMENT TOTAL KNEE    . resecton of acoustic neuroma    . stent surgery    . TONSILLECTOMY       OB History    Gravida      Para      Term      Preterm      AB      Living  0     SAB      TAB      Ectopic      Multiple      Live Births               Home Medications    Prior to Admission medications   Medication Sig Start Date End Date Taking? Authorizing Provider  donepezil (ARICEPT) 5 MG tablet Take 5 mg by mouth daily.     [provider]  HUMALOG KWIKPEN 100  UNIT/ML KiwkPen Inject 10 Units into the skin 3 (three) times daily.  05/02/16   [provider]  insulin detemir (LEVEMIR) 100 UNIT/ML injection Inject 70 Units into the skin every morning.     [provider]    Family History Family History  Problem Relation Age of Onset  . Diabetes Maternal Grandmother        unspecif if Maternal or Paternal  . Heart failure Mother   . Cirrhosis Father   . Coronary artery disease Other        family history  . Asthma Other        family history-grandfather  . Colon cancer Other        family hx    Social History Social History   Tobacco Use  . Smoking status: Never Smoker  . Smokeless tobacco: Never Used  Substance Use Topics  . Alcohol use: No    Comment: occasional  . Drug use: No     Allergies   Latex; Morphine; Adhesive [tape]; Codeine; and Penicillins   Review of Systems Review of Systems  All other systems reviewed and are negative.    Physical Exam Updated Vital Signs BP (!) 144/67 (BP Location: Left Arm)   Pulse 82   Temp 98.2 F (36.8 C) (Oral)   Resp 18   Ht 5' 5.5" (1.664 m)   Wt 81.6 kg   SpO2 97%   BMI 29.50 kg/m   Physical Exam Vitals signs and nursing note reviewed.  Constitutional:      General: She is not in acute distress.    Appearance: She is well-developed. She is not diaphoretic.  HENT:     Head: Normocephalic and atraumatic.  Neck:     Musculoskeletal: Normal range of motion and neck supple.  Cardiovascular:     Rate and Rhythm: Normal rate and regular rhythm.     Heart sounds: No murmur. No friction rub. No gallop.   Pulmonary:     Effort: Pulmonary effort is normal. No respiratory distress.     Breath sounds: Normal breath sounds. No wheezing.  Abdominal:     General: Bowel sounds are normal. There is no distension.     Palpations: Abdomen is soft.     Tenderness: There is no abdominal tenderness.  Musculoskeletal: Normal range of motion.  Skin:    General: Skin  is warm and dry.  Neurological:     Mental Status: She is alert and oriented to person, place, and time.  Psychiatric:        Attention and Perception: Attention normal.        Mood and Affect: Mood normal.        Speech: Speech normal.        Behavior: Behavior normal.        Thought Content: Thought content normal.        Cognition and Memory: Cognition normal.      ED Treatments / Results  Labs (all labs ordered are listed, but only abnormal results are displayed) Labs Reviewed  CBC WITH DIFFERENTIAL/PLATELET  COMPREHENSIVE METABOLIC PANEL    EKG None  Radiology No results found.  Procedures Procedures (including critical care time)  Medications Ordered in ED Medications - No data to display   Initial Impression / Assessment and Plan / ED Course  I have reviewed the triage vital signs and the nursing notes.  Pertinent labs & imaging results that were available during my care of the patient were reviewed by me and considered in my medical decision making (see chart for details).  Patient brought here for evaluation of behavioral issues at her extended care facility.  Patient has history of dementia and apparently has been trying to leave the facility.  I have also told that she has  mentioned to staff there that she wishes she was dead.  The patient denies any suicidal or homicidal ideation.  She appears well and I see no indication for psychiatric consultation.  She will be discharged back to the extended care facility.  They can continue giving her Ativan as needed for agitation.  Final Clinical Impressions(s) / ED Diagnoses   Final diagnoses:  None    ED Discharge Orders    None       Veryl Speak, MD 05/23/18 [phone]

## 2018-05-22 NOTE — ED Notes (Signed)
ED Provider at bedside. 
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
## 2018-05-23 DIAGNOSIS — R410 Disorientation, unspecified: Secondary | ICD-10-CM | POA: Diagnosis not present

## 2018-05-23 DIAGNOSIS — K219 Gastro-esophageal reflux disease without esophagitis: Secondary | ICD-10-CM | POA: Diagnosis not present

## 2018-05-23 DIAGNOSIS — R404 Transient alteration of awareness: Secondary | ICD-10-CM | POA: Diagnosis not present

## 2018-05-23 DIAGNOSIS — Z743 Need for continuous supervision: Secondary | ICD-10-CM | POA: Diagnosis not present

## 2018-05-23 LAB — URINALYSIS, ROUTINE W REFLEX MICROSCOPIC
BACTERIA UA: NONE SEEN
Bilirubin Urine: NEGATIVE
Glucose, UA: >=500 mg/dL — AB
Hgb urine dipstick: NEGATIVE
Ketones, ur: NEGATIVE mg/dL
Nitrite: NEGATIVE
Protein, ur: NEGATIVE mg/dL
Specific Gravity, Urine: 1.014 (ref 1.005–1.030)
pH: 5 (ref 5.0–8.0)

## 2018-05-23 MED ORDER — LORAZEPAM 1 MG PO TABS
1.0000 mg | ORAL_TABLET | Freq: Once | ORAL | Status: AC
  Administered 2018-05-23: 1 mg via ORAL
  Filled 2018-05-23: qty 1

## 2018-05-23 NOTE — ED Notes (Signed)
Ems here to transport pt,  

## 2018-05-23 NOTE — Discharge Instructions (Addendum)
Continue medications as previously prescribed. 

## 2018-05-24 DIAGNOSIS — E559 Vitamin D deficiency, unspecified: Secondary | ICD-10-CM | POA: Diagnosis not present

## 2018-05-24 DIAGNOSIS — E538 Deficiency of other specified B group vitamins: Secondary | ICD-10-CM | POA: Diagnosis not present

## 2018-05-24 DIAGNOSIS — E1151 Type 2 diabetes mellitus with diabetic peripheral angiopathy without gangrene: Secondary | ICD-10-CM | POA: Diagnosis not present

## 2018-05-25 DIAGNOSIS — L97529 Non-pressure chronic ulcer of other part of left foot with unspecified severity: Secondary | ICD-10-CM | POA: Diagnosis not present

## 2018-05-25 DIAGNOSIS — R51 Headache: Secondary | ICD-10-CM | POA: Diagnosis not present

## 2018-05-26 DIAGNOSIS — I25119 Atherosclerotic heart disease of native coronary artery with unspecified angina pectoris: Secondary | ICD-10-CM | POA: Diagnosis not present

## 2018-05-26 DIAGNOSIS — E1165 Type 2 diabetes mellitus with hyperglycemia: Secondary | ICD-10-CM | POA: Diagnosis not present

## 2018-05-26 DIAGNOSIS — I5032 Chronic diastolic (congestive) heart failure: Secondary | ICD-10-CM | POA: Diagnosis not present

## 2018-05-26 DIAGNOSIS — E1159 Type 2 diabetes mellitus with other circulatory complications: Secondary | ICD-10-CM | POA: Diagnosis not present

## 2018-05-26 DIAGNOSIS — K3184 Gastroparesis: Secondary | ICD-10-CM | POA: Diagnosis not present

## 2018-05-28 DIAGNOSIS — I359 Nonrheumatic aortic valve disorder, unspecified: Secondary | ICD-10-CM | POA: Diagnosis not present

## 2018-05-28 DIAGNOSIS — I5032 Chronic diastolic (congestive) heart failure: Secondary | ICD-10-CM | POA: Diagnosis not present

## 2018-05-28 DIAGNOSIS — I517 Cardiomegaly: Secondary | ICD-10-CM | POA: Diagnosis not present

## 2018-05-28 DIAGNOSIS — I519 Heart disease, unspecified: Secondary | ICD-10-CM | POA: Diagnosis not present

## 2018-05-28 DIAGNOSIS — E1159 Type 2 diabetes mellitus with other circulatory complications: Secondary | ICD-10-CM | POA: Diagnosis not present

## 2018-05-28 DIAGNOSIS — K3184 Gastroparesis: Secondary | ICD-10-CM | POA: Diagnosis not present

## 2018-05-28 DIAGNOSIS — I25119 Atherosclerotic heart disease of native coronary artery with unspecified angina pectoris: Secondary | ICD-10-CM | POA: Diagnosis not present

## 2018-05-28 DIAGNOSIS — E1165 Type 2 diabetes mellitus with hyperglycemia: Secondary | ICD-10-CM | POA: Diagnosis not present

## 2018-05-30 DIAGNOSIS — E1165 Type 2 diabetes mellitus with hyperglycemia: Secondary | ICD-10-CM | POA: Diagnosis not present

## 2018-05-30 DIAGNOSIS — T383X5A Adverse effect of insulin and oral hypoglycemic [antidiabetic] drugs, initial encounter: Secondary | ICD-10-CM | POA: Diagnosis not present

## 2018-05-30 DIAGNOSIS — I10 Essential (primary) hypertension: Secondary | ICD-10-CM | POA: Diagnosis not present

## 2018-05-30 DIAGNOSIS — E160 Drug-induced hypoglycemia without coma: Secondary | ICD-10-CM | POA: Diagnosis not present

## 2018-05-30 DIAGNOSIS — E1159 Type 2 diabetes mellitus with other circulatory complications: Secondary | ICD-10-CM | POA: Diagnosis not present

## 2018-05-31 DIAGNOSIS — E1165 Type 2 diabetes mellitus with hyperglycemia: Secondary | ICD-10-CM | POA: Diagnosis not present

## 2018-05-31 DIAGNOSIS — E160 Drug-induced hypoglycemia without coma: Secondary | ICD-10-CM | POA: Diagnosis not present

## 2018-05-31 DIAGNOSIS — T383X5A Adverse effect of insulin and oral hypoglycemic [antidiabetic] drugs, initial encounter: Secondary | ICD-10-CM | POA: Diagnosis not present

## 2018-05-31 DIAGNOSIS — E1159 Type 2 diabetes mellitus with other circulatory complications: Secondary | ICD-10-CM | POA: Diagnosis not present

## 2018-05-31 DIAGNOSIS — I10 Essential (primary) hypertension: Secondary | ICD-10-CM | POA: Diagnosis not present

## 2018-06-01 DIAGNOSIS — E160 Drug-induced hypoglycemia without coma: Secondary | ICD-10-CM | POA: Diagnosis not present

## 2018-06-01 DIAGNOSIS — I25119 Atherosclerotic heart disease of native coronary artery with unspecified angina pectoris: Secondary | ICD-10-CM | POA: Diagnosis not present

## 2018-06-01 DIAGNOSIS — E1159 Type 2 diabetes mellitus with other circulatory complications: Secondary | ICD-10-CM | POA: Diagnosis not present

## 2018-06-01 DIAGNOSIS — E1165 Type 2 diabetes mellitus with hyperglycemia: Secondary | ICD-10-CM | POA: Diagnosis not present

## 2018-06-02 DIAGNOSIS — G301 Alzheimer's disease with late onset: Secondary | ICD-10-CM | POA: Diagnosis not present

## 2018-06-02 DIAGNOSIS — Z955 Presence of coronary angioplasty implant and graft: Secondary | ICD-10-CM | POA: Diagnosis not present

## 2018-06-02 DIAGNOSIS — I10 Essential (primary) hypertension: Secondary | ICD-10-CM | POA: Diagnosis not present

## 2018-06-02 DIAGNOSIS — G20 Parkinson's disease: Secondary | ICD-10-CM | POA: Diagnosis not present

## 2018-06-02 DIAGNOSIS — L97529 Non-pressure chronic ulcer of other part of left foot with unspecified severity: Secondary | ICD-10-CM | POA: Diagnosis not present

## 2018-06-02 DIAGNOSIS — E160 Drug-induced hypoglycemia without coma: Secondary | ICD-10-CM | POA: Diagnosis not present

## 2018-06-02 DIAGNOSIS — E1159 Type 2 diabetes mellitus with other circulatory complications: Secondary | ICD-10-CM | POA: Diagnosis not present

## 2018-06-02 DIAGNOSIS — I959 Hypotension, unspecified: Secondary | ICD-10-CM | POA: Diagnosis not present

## 2018-06-02 DIAGNOSIS — I2510 Atherosclerotic heart disease of native coronary artery without angina pectoris: Secondary | ICD-10-CM | POA: Diagnosis not present

## 2018-06-02 DIAGNOSIS — I25119 Atherosclerotic heart disease of native coronary artery with unspecified angina pectoris: Secondary | ICD-10-CM | POA: Diagnosis not present

## 2018-06-02 DIAGNOSIS — E1129 Type 2 diabetes mellitus with other diabetic kidney complication: Secondary | ICD-10-CM | POA: Diagnosis not present

## 2018-06-02 DIAGNOSIS — Z794 Long term (current) use of insulin: Secondary | ICD-10-CM | POA: Diagnosis not present

## 2018-06-02 DIAGNOSIS — E1165 Type 2 diabetes mellitus with hyperglycemia: Secondary | ICD-10-CM | POA: Diagnosis not present

## 2018-06-02 DIAGNOSIS — K219 Gastro-esophageal reflux disease without esophagitis: Secondary | ICD-10-CM | POA: Diagnosis not present

## 2018-06-02 DIAGNOSIS — Z85038 Personal history of other malignant neoplasm of large intestine: Secondary | ICD-10-CM | POA: Diagnosis not present

## 2018-06-02 DIAGNOSIS — I5032 Chronic diastolic (congestive) heart failure: Secondary | ICD-10-CM | POA: Diagnosis not present

## 2018-06-02 DIAGNOSIS — E538 Deficiency of other specified B group vitamins: Secondary | ICD-10-CM | POA: Diagnosis not present

## 2018-06-02 DIAGNOSIS — G309 Alzheimer's disease, unspecified: Secondary | ICD-10-CM | POA: Diagnosis not present

## 2018-06-02 DIAGNOSIS — E11621 Type 2 diabetes mellitus with foot ulcer: Secondary | ICD-10-CM | POA: Diagnosis not present

## 2018-06-02 DIAGNOSIS — L97429 Non-pressure chronic ulcer of left heel and midfoot with unspecified severity: Secondary | ICD-10-CM | POA: Diagnosis not present

## 2018-06-02 DIAGNOSIS — E785 Hyperlipidemia, unspecified: Secondary | ICD-10-CM | POA: Diagnosis not present

## 2018-06-02 DIAGNOSIS — G4701 Insomnia due to medical condition: Secondary | ICD-10-CM | POA: Diagnosis not present

## 2018-06-02 DIAGNOSIS — E559 Vitamin D deficiency, unspecified: Secondary | ICD-10-CM | POA: Diagnosis not present

## 2018-06-02 DIAGNOSIS — G4700 Insomnia, unspecified: Secondary | ICD-10-CM | POA: Diagnosis not present

## 2018-06-30 DIAGNOSIS — G301 Alzheimer's disease with late onset: Secondary | ICD-10-CM | POA: Diagnosis not present

## 2018-06-30 DIAGNOSIS — G4701 Insomnia due to medical condition: Secondary | ICD-10-CM | POA: Diagnosis not present

## 2018-06-30 DIAGNOSIS — G20 Parkinson's disease: Secondary | ICD-10-CM | POA: Diagnosis not present

## 2018-07-08 DIAGNOSIS — Z79899 Other long term (current) drug therapy: Secondary | ICD-10-CM | POA: Diagnosis not present

## 2018-07-13 DIAGNOSIS — G20 Parkinson's disease: Secondary | ICD-10-CM | POA: Diagnosis not present

## 2018-07-13 DIAGNOSIS — G301 Alzheimer's disease with late onset: Secondary | ICD-10-CM | POA: Diagnosis not present

## 2018-07-13 DIAGNOSIS — G4701 Insomnia due to medical condition: Secondary | ICD-10-CM | POA: Diagnosis not present

## 2018-07-15 DIAGNOSIS — G301 Alzheimer's disease with late onset: Secondary | ICD-10-CM | POA: Diagnosis not present

## 2018-07-15 DIAGNOSIS — G20 Parkinson's disease: Secondary | ICD-10-CM | POA: Diagnosis not present

## 2018-07-15 DIAGNOSIS — G4701 Insomnia due to medical condition: Secondary | ICD-10-CM | POA: Diagnosis not present

## 2018-07-22 DIAGNOSIS — E1151 Type 2 diabetes mellitus with diabetic peripheral angiopathy without gangrene: Secondary | ICD-10-CM | POA: Diagnosis not present

## 2018-07-22 DIAGNOSIS — I10 Essential (primary) hypertension: Secondary | ICD-10-CM | POA: Diagnosis not present

## 2018-07-22 DIAGNOSIS — G309 Alzheimer's disease, unspecified: Secondary | ICD-10-CM | POA: Diagnosis not present

## 2018-07-24 ENCOUNTER — Other Ambulatory Visit: Payer: Self-pay

## 2018-07-24 NOTE — Patient Outreach (Signed)
[facility]) Care Management  07/24/2018  [name] 03/20/1945 [id number]   Medication Adherence call to [name] HIPPA Compliant Voice message left with a call back number. [name] is showing past due on Simvastatin 10 mg under [facility].   [facility] Management Direct Dial [phone]  Fax [phone] [email or web address]

## 2018-07-27 DIAGNOSIS — Z1383 Encounter for screening for respiratory disorder NEC: Secondary | ICD-10-CM | POA: Diagnosis not present

## 2018-07-29 DIAGNOSIS — G20 Parkinson's disease: Secondary | ICD-10-CM | POA: Diagnosis not present

## 2018-07-29 DIAGNOSIS — G301 Alzheimer's disease with late onset: Secondary | ICD-10-CM | POA: Diagnosis not present

## 2018-08-06 DIAGNOSIS — M6281 Muscle weakness (generalized): Secondary | ICD-10-CM | POA: Diagnosis not present

## 2018-08-06 DIAGNOSIS — N390 Urinary tract infection, site not specified: Secondary | ICD-10-CM | POA: Diagnosis not present

## 2018-08-07 DIAGNOSIS — M6281 Muscle weakness (generalized): Secondary | ICD-10-CM | POA: Diagnosis not present

## 2018-08-10 DIAGNOSIS — M6281 Muscle weakness (generalized): Secondary | ICD-10-CM | POA: Diagnosis not present

## 2018-08-11 DIAGNOSIS — M6281 Muscle weakness (generalized): Secondary | ICD-10-CM | POA: Diagnosis not present

## 2018-08-12 DIAGNOSIS — G20 Parkinson's disease: Secondary | ICD-10-CM | POA: Diagnosis not present

## 2018-08-12 DIAGNOSIS — E119 Type 2 diabetes mellitus without complications: Secondary | ICD-10-CM | POA: Diagnosis not present

## 2018-08-12 DIAGNOSIS — I2510 Atherosclerotic heart disease of native coronary artery without angina pectoris: Secondary | ICD-10-CM | POA: Diagnosis not present

## 2018-08-12 DIAGNOSIS — M6281 Muscle weakness (generalized): Secondary | ICD-10-CM | POA: Diagnosis not present

## 2018-08-12 DIAGNOSIS — G301 Alzheimer's disease with late onset: Secondary | ICD-10-CM | POA: Diagnosis not present

## 2018-08-12 DIAGNOSIS — G4701 Insomnia due to medical condition: Secondary | ICD-10-CM | POA: Diagnosis not present

## 2018-08-12 DIAGNOSIS — I509 Heart failure, unspecified: Secondary | ICD-10-CM | POA: Diagnosis not present

## 2018-08-13 DIAGNOSIS — E1151 Type 2 diabetes mellitus with diabetic peripheral angiopathy without gangrene: Secondary | ICD-10-CM | POA: Diagnosis not present

## 2018-08-13 DIAGNOSIS — B351 Tinea unguium: Secondary | ICD-10-CM | POA: Diagnosis not present

## 2018-08-13 DIAGNOSIS — M6281 Muscle weakness (generalized): Secondary | ICD-10-CM | POA: Diagnosis not present

## 2018-08-14 DIAGNOSIS — M6281 Muscle weakness (generalized): Secondary | ICD-10-CM | POA: Diagnosis not present

## 2018-08-17 DIAGNOSIS — M6281 Muscle weakness (generalized): Secondary | ICD-10-CM | POA: Diagnosis not present

## 2018-08-18 DIAGNOSIS — M6281 Muscle weakness (generalized): Secondary | ICD-10-CM | POA: Diagnosis not present

## 2018-08-19 DIAGNOSIS — M6281 Muscle weakness (generalized): Secondary | ICD-10-CM | POA: Diagnosis not present

## 2018-08-20 DIAGNOSIS — M6281 Muscle weakness (generalized): Secondary | ICD-10-CM | POA: Diagnosis not present

## 2018-08-21 DIAGNOSIS — M6281 Muscle weakness (generalized): Secondary | ICD-10-CM | POA: Diagnosis not present

## 2018-08-24 DIAGNOSIS — M6281 Muscle weakness (generalized): Secondary | ICD-10-CM | POA: Diagnosis not present

## 2018-08-25 DIAGNOSIS — M6281 Muscle weakness (generalized): Secondary | ICD-10-CM | POA: Diagnosis not present

## 2018-09-09 ENCOUNTER — Emergency Department (HOSPITAL_COMMUNITY): Payer: Medicare Other

## 2018-09-09 ENCOUNTER — Other Ambulatory Visit: Payer: Self-pay

## 2018-09-09 ENCOUNTER — Encounter (HOSPITAL_COMMUNITY): Payer: Self-pay | Admitting: *Deleted

## 2018-09-09 ENCOUNTER — Emergency Department (HOSPITAL_COMMUNITY)
Admission: EM | Admit: 2018-09-09 | Discharge: 2018-09-09 | Disposition: A | Discharge status: Skilled Nursing Facility | Payer: Medicare Other | Attending: Emergency Medicine | Admitting: Emergency Medicine

## 2018-09-09 DIAGNOSIS — M25552 Pain in left hip: Secondary | ICD-10-CM | POA: Diagnosis not present

## 2018-09-09 DIAGNOSIS — I110 Hypertensive heart disease with heart failure: Secondary | ICD-10-CM | POA: Diagnosis not present

## 2018-09-09 DIAGNOSIS — E119 Type 2 diabetes mellitus without complications: Secondary | ICD-10-CM | POA: Diagnosis not present

## 2018-09-09 DIAGNOSIS — G20 Parkinson's disease: Secondary | ICD-10-CM | POA: Insufficient documentation

## 2018-09-09 DIAGNOSIS — Z96659 Presence of unspecified artificial knee joint: Secondary | ICD-10-CM | POA: Diagnosis not present

## 2018-09-09 DIAGNOSIS — W19XXXA Unspecified fall, initial encounter: Secondary | ICD-10-CM

## 2018-09-09 DIAGNOSIS — I2510 Atherosclerotic heart disease of native coronary artery without angina pectoris: Secondary | ICD-10-CM | POA: Diagnosis not present

## 2018-09-09 DIAGNOSIS — Z794 Long term (current) use of insulin: Secondary | ICD-10-CM | POA: Diagnosis not present

## 2018-09-09 DIAGNOSIS — M25562 Pain in left knee: Secondary | ICD-10-CM

## 2018-09-09 DIAGNOSIS — Z79899 Other long term (current) drug therapy: Secondary | ICD-10-CM | POA: Insufficient documentation

## 2018-09-09 DIAGNOSIS — I5030 Unspecified diastolic (congestive) heart failure: Secondary | ICD-10-CM | POA: Insufficient documentation

## 2018-09-09 DIAGNOSIS — Z951 Presence of aortocoronary bypass graft: Secondary | ICD-10-CM | POA: Insufficient documentation

## 2018-09-09 NOTE — ED Triage Notes (Signed)
Patient fell at assisted living using he walker.  Patient fell on left side with left hip and right knee pain.  Patient denies hitting head.  CBG 361 per EMS.

## 2018-09-09 NOTE — Discharge Instructions (Signed)
Follow-up with PCP for reevaluation of blood pressure.  Return to the ED for any new worsening symptoms.

## 2018-09-09 NOTE — ED Notes (Signed)
Patient transported to X-ray 

## 2018-09-09 NOTE — ED Notes (Signed)
Pt ambulatory to [facility]

## 2018-09-09 NOTE — ED Provider Notes (Signed)
[facility] EMERGENCY DEPARTMENT Provider Note   CSN: [id number] Arrival date & time: 09/09/18  1126   History   Chief Complaint Chief Complaint  Patient presents with  . Fall    HPI [name] is a 73 y.o. female with past medical history significant for CAD, depression, diabetes, fibromyalgia, GERD, Perkinson's disease, dementia who is ambulatory with walker at baseline who presents for evaluation of witnessed mechanical fall.  Patient stays at an assisted living facility when she tripped and fell over her walker approximately 4 days ago.  Patient states she was ambulatory after incident.  Patient states she fell on her left side.  She has had mild pain to her left hip and left knee.  Denies hitting head or LOC.  Denies anticoagulation.  Level 5 caveat--dementia.     HPI  Past Medical History:  Diagnosis Date  . Acoustic neuroma (HCC)    left ear  . 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 Sonny Dandy)  . Anginal pain ([place])    DR Theola Sequin    . Anxiety   . Asthma    AS CHILD   . Blurred vision   . Coronary artery disease    a. s/p stent to lad 2006 w subsequent kissing balloon pci to diagonal;  b.  pci of the lad w a drug-eluting stent July 2010;  c.  LHC 11/13/11: p-mLAD stent with prox 50% ISR, 80% beyond dist edge of stent, m+dLAD beyond stent 40-50%, mod caliber pD1 80%, mRCA 70%, m-dRCA 20-30%, EF 55-65% => med Rx (consider CABG)  . Depressed   . Diabetes mellitus   . Dyslipidemia   . Fibromyalgia   . Gastroparesis   . GERD (gastroesophageal reflux disease)   . Headache(784.0)   . Hiatal hernia   . Hypertension   . Morbid obesity ([place])   . Osteoarthritis   . Parkinson disease ([place])    ambulates with cane  . Sleep apnea    12+ YRS NO MACHINE GOTTEN   . Varicose vein     Patient Active Problem List   Diagnosis Date Noted  . Delirium 02/14/2016  . Lactic acid increased 02/14/2016  .  Cellulitis 03/26/2013  . Vomiting 02/08/2012  . 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 Recurrent ventral incisional hernia 09/26/2010  . Obesity, Class III, BMI 40-49.9 (morbid obesity) ([place]) 09/26/2010  . HYPERTENSION, BENIGN 02/06/2009  . PERSONAL HISTORY MALIG NEOPLASM LARGE INTESTINE 12/14/2008  . COUGH 10/26/2008  . DIASTOLIC HEART FAILURE, CHRONIC 09/27/2008  . CAD, NATIVE VESSEL 09/14/2008  . CHEST PAIN UNSPECIFIED 09/13/2008  . Malignant neoplasm of ascending colon ([place]) 12/07/2007  . BENIGN NEOPLASM OF COLON 09/23/2007  . Diabetes ([place]) 09/23/2007  . IRON DEFICIENCY 09/23/2007  . GASTROPARESIS 09/23/2007  . FIBROMYALGIA 09/23/2007  . FECAL OCCULT BLOOD 09/23/2007    Past Surgical History:  Procedure Laterality Date  . ABDOMINAL HYSTERECTOMY  1993  . APPENDECTOMY    . APPLICATION OF WOUND VAC N/A 03/29/2013   Procedure: APPLICATION OF WOUND VAC;  Surgeon: Jamesetta So, MD;  Location: [facility];  Service: General;  Laterality: N/A;  . BACK SURGERY    . CARDIAC CATHETERIZATION    . CATARACT EXTRACTION    . CHOLECYSTECTOMY  1966  . COLON SURGERY    . COLONOSCOPY N/A 09/21/2013   Procedure: COLONOSCOPY;  Surgeon: Inda Castle, MD;  Location: [facility];  Service: Endoscopy;  Laterality: N/A;  .  CORONARY ARTERY BYPASS GRAFT  01/20/2012   Procedure: CORONARY ARTERY BYPASS GRAFTING (CABG);  Surgeon: Gaye Pollack, MD;  Location: [facility];  Service: Open Heart Surgery;  Laterality: N/A;  Coronary artery bypass graft times three utilizing the left internal mammary artery and the right greater saphenous vein harvested endoscopically  . ENDOVEIN HARVEST OF GREATER SAPHENOUS VEIN  01/20/2012   Procedure: ENDOVEIN HARVEST OF GREATER SAPHENOUS VEIN;  Surgeon: Gaye Pollack, MD;  Location: [facility];  Service: Open Heart Surgery;  Laterality: Right;  . ESOPHAGOGASTRODUODENOSCOPY N/A 04/19/2014   Procedure: ESOPHAGOGASTRODUODENOSCOPY (EGD);  Surgeon: Jamesetta So, MD;  Location[facility];   Service: Gastroenterology;  Laterality: N/A;  . EXCISION MORTON'S NEUROMA    . EYE SURGERY     cataract on right 2005, left 2008  . FETAL SURGERY FOR CONGENITAL HERNIA    . FINGER REPLANTATION     RECENTLY   . HEMICOLECTOMY  12/28/07   right for adenocarcinoma of ascending colon  . [place], 2002, 2007   open x1, lap x2   . herniaorraphy  01/22/06   for recurrent ventral hernia  . HYSTEROTOMY  1993  . INCISION AND DRAINAGE ABSCESS N/A 03/29/2013   Procedure: INCISION AND DRAINAGE ABDOMINAL WALL ABSCESS;  Surgeon: Jamesetta So, MD;  Location: [facility];  Service: General;  Laterality: N/A;  . JOINT REPLACEMENT  2005 and 2006   right 2005, left 2006  . KNEE ARTHROSCOPY    . LAPAROTOMY     for small bowl obstruction  . LEFT HEART CATHETERIZATION WITH CORONARY ANGIOGRAM N/A 11/13/2011   Procedure: LEFT HEART CATHETERIZATION WITH CORONARY ANGIOGRAM;  Surgeon: Sherren Mocha, MD;  Location: [facility] CATH LAB;  Service: Cardiovascular;  Laterality: N/A;  . LESION REMOVAL  10/08/2011   Procedure: LESION REMOVAL;  Surgeon: Sanjuana Kava, MD;  Location: [facility];  Service: Orthopedics;  Laterality: Right;  Removal Lesion Right Long Finger  . MANDIBLE SURGERY    . PORT-A-CATH REMOVAL Right 06/17/2014   Procedure: REMOVAL PORT-A-CATH;  Surgeon: Aviva Signs Md, MD;  Location: [facility];  Service: General;  Laterality: Right;  . PORTACATH PLACEMENT    . REPLACEMENT TOTAL KNEE    . resecton of acoustic neuroma    . stent surgery    . TONSILLECTOMY       OB History    Gravida      Para      Term      Preterm      AB      Living  0     SAB      TAB      Ectopic      Multiple      Live Births               Home Medications    Prior to Admission medications   Medication Sig Start Date End Date Taking? Authorizing Provider  AMINO ACIDS-PROTEIN HYDROLYS PO Take 30 mLs by mouth daily.   Yes [provider]  Cholecalciferol (VITAMIN D3) 50 MCG (2000 UT) TABS Take 1  tablet by mouth daily.   Yes [provider]  clonazePAM (KLONOPIN) 0.5 MG tablet Take 0.5 mg by mouth 3 (three) times daily as needed for anxiety.   Yes [provider]  divalproex (DEPAKOTE) 250 MG DR tablet Take 250 mg by mouth 3 (three) times daily.   Yes [provider]  donepezil (ARICEPT) 5 MG tablet Take 5 mg by  mouth daily.    Yes [provider]  HUMALOG KWIKPEN 100 UNIT/ML KiwkPen Inject 6 Units into the skin 3 (three) times daily.  05/02/16  Yes [provider]  insulin detemir (LEVEMIR) 100 UNIT/ML injection Inject 20 Units into the skin 2 (two) times daily.    Yes [provider]  lisinopril (ZESTRIL) 2.5 MG tablet Take 2.5 mg by mouth daily.   Yes [provider]  Multiple Vitamins-Minerals (MULTIVITAMIN WITH MINERALS) tablet Take 1 tablet by mouth daily.   Yes [provider]  Omega-3 Fatty Acids (FISH OIL) 1000 MG CAPS Take 1 capsule by mouth 2 (two) times a day.   Yes [provider]  risperiDONE (RISPERDAL) 0.25 MG tablet Take 0.25 mg by mouth 2 (two) times daily.   Yes [provider]  sertraline (ZOLOFT) 100 MG tablet Take 100 mg by mouth daily.   Yes [provider]  simvastatin (ZOCOR) 10 MG tablet Take 10 mg by mouth at bedtime.   Yes [provider]  vitamin B-12 (CYANOCOBALAMIN) 1000 MCG tablet Take 1,000 mcg by mouth daily.   Yes [provider]  vitamin C (ASCORBIC ACID) 500 MG tablet Take 500 mg by mouth daily.   Yes [provider]    Family History Family History  Problem Relation Age of Onset  . Diabetes Maternal Grandmother        unspecif if Maternal or Paternal  . Heart failure Mother   . Cirrhosis Father   . Coronary artery disease Other        family history  . Asthma Other        family history-grandfather  . Colon cancer Other        family hx    Social History Social History   Tobacco Use  . Smoking status: Never Smoker   . Smokeless tobacco: Never Used  Substance Use Topics  . Alcohol use: No    Comment: occasional  . Drug use: No     Allergies   Latex, Morphine, Adhesive [tape], Codeine, and Penicillins   Review of Systems Review of Systems  Unable to perform ROS: Dementia  All other systems reviewed and are negative.    Physical Exam Updated Vital Signs BP (!) 173/72   Pulse 66   Temp 97.9 F (36.6 C) (Oral)   Resp 15   Ht 5' 5.5" (1.664 m)   Wt 81.6 kg   SpO2 97%   BMI 29.50 kg/m   Physical Exam Vitals signs and nursing note reviewed.  Constitutional:      General: She is not in acute distress.    Appearance: She is well-developed. She is not ill-appearing, toxic-appearing or diaphoretic.  HENT:     Head: Normocephalic and atraumatic.     Nose: Nose normal.     Mouth/Throat:     Mouth: Mucous membranes are moist.     Pharynx: Oropharynx is clear.  Eyes:     Pupils: Pupils are equal, round, and reactive to light.  Neck:     Musculoskeletal: Normal range of motion.  Cardiovascular:     Rate and Rhythm: Normal rate.     Pulses: Normal pulses.     Heart sounds: Normal heart sounds. No murmur. No friction rub. No gallop.   Pulmonary:     Effort: Pulmonary effort is normal. No respiratory distress.     Breath sounds: Normal breath sounds. No stridor. No wheezing or rales.  Chest:     Comments: No  tenderness to chest wall or ribs.  No overlying skin changes, no crepitus or deformity. Abdominal:     General: Bowel sounds are normal. There is no distension.     Tenderness: There is no abdominal tenderness. There is no guarding.     Hernia: A hernia is present.     Comments: Reducible abdominal wall hernia to the left mid abdomen.  Nontender to palpation.  No overlying skin changes.  No CVA tenderness bilaterally.  Musculoskeletal: Normal range of motion.     Comments: Moves all 4 extremities without difficulty.  She has no shortening or rotation of legs.  Pelvis non-tender to  palpation.  Able to flex, extend at bilateral knees.  Negative varus, valgus stress.  Negative anterior drawer.  Skin:    General: Skin is warm and dry.     Capillary Refill: Capillary refill takes less than 2 seconds.     Comments: No contusions, abrasions, rashes or lesions.  Bilateral calves without tenderness, redness, swelling or warmth.  Brisk capillary refill.  Neurological:     General: No focal deficit present.     Mental Status: She is alert.     Cranial Nerves: No cranial nerve deficit.     Sensory: No sensory deficit.     Coordination: Coordination normal.     Comments: No facial droop.  Cranial nerves II through XII grossly intact.  No dysphasia.4+/5 strength to bilateral upper and lower extremities without difficulty.  Oriented to person however not place or time.  Patient thinks the year is 2001.    ED Treatments / Results  Labs (all labs ordered are listed, but only abnormal results are displayed) Labs Reviewed - No data to display  EKG None  Radiology Ct Head Wo Contrast  Result Date: 09/09/2018 CLINICAL DATA:  Patient fell at assisted living using hre walker, fell on left side with left hip and right knee pain. Patient denies hitting head. EXAM: CT HEAD WITHOUT CONTRAST CT CERVICAL SPINE WITHOUT CONTRAST TECHNIQUE: Multidetector CT imaging of the head and cervical spine was performed following the standard protocol without intravenous contrast. Multiplanar CT image reconstructions of the cervical spine were also generated. COMPARISON:  02/02/2018 FINDINGS: CT HEAD FINDINGS Brain: No evidence of acute infarction, hemorrhage, hydrocephalus, extra-axial collection or mass lesion/mass effect. Ventricular and sulcal enlargement reflecting age-appropriate volume loss. Mild periventricular white matter hypoattenuation is noted consistent with chronic microvascular ischemic change. Vascular: No hyperdense vessel or unexpected calcification. Skull: Normal. Negative for fracture or  focal lesion. Sinuses/Orbits: Globes and orbits are unremarkable there is moderate mucosal thickening lining the left maxillary sinus. Remaining sinuses are clear. There changes from prior ORIF of maxillary fractures. Previous left mastoid resection. These findings are stable. Other: None. CT CERVICAL SPINE FINDINGS Alignment: Normal. Skull base and vertebrae: No acute fracture. No primary bone lesion or focal pathologic process. Soft tissues and spinal canal: No prevertebral fluid or swelling. No visible canal hematoma. Disc levels: Moderate loss of disc height at C6-C7 with endplate spurring and spondylotic disc bulging. Remaining discs are well preserved in height. No convincing disc herniation. Upper chest: No acute findings.  Clear lung apices. Other: None. IMPRESSION: HEAD CT 1. No acute intracranial abnormalities. 2. Mild chronic microvascular ischemic change. 3. Chronic moderate mucosal thickening in the left maxillary sinus. CERVICAL CT 1. No fracture or acute finding. Electronically Signed   By: Lajean Manes M.D.   On: 09/09/2018 13:41   Ct Cervical Spine Wo Contrast  Result Date: 09/09/2018 CLINICAL DATA:  Patient fell at assisted living using hre walker, fell on left side with left hip and right knee pain. Patient denies hitting head. EXAM: CT HEAD WITHOUT CONTRAST CT CERVICAL SPINE WITHOUT CONTRAST TECHNIQUE: Multidetector CT imaging of the head and cervical spine was performed following the standard protocol without intravenous contrast. Multiplanar CT image reconstructions of the cervical spine were also generated. COMPARISON:  02/02/2018 FINDINGS: CT HEAD FINDINGS Brain: No evidence of acute infarction, hemorrhage, hydrocephalus, extra-axial collection or mass lesion/mass effect. Ventricular and sulcal enlargement reflecting age-appropriate volume loss. Mild periventricular white matter hypoattenuation is noted consistent with chronic microvascular ischemic change. Vascular: No hyperdense vessel  or unexpected calcification. Skull: Normal. Negative for fracture or focal lesion. Sinuses/Orbits: Globes and orbits are unremarkable there is moderate mucosal thickening lining the left maxillary sinus. Remaining sinuses are clear. There changes from prior ORIF of maxillary fractures. Previous left mastoid resection. These findings are stable. Other: None. CT CERVICAL SPINE FINDINGS Alignment: Normal. Skull base and vertebrae: No acute fracture. No primary bone lesion or focal pathologic process. Soft tissues and spinal canal: No prevertebral fluid or swelling. No visible canal hematoma. Disc levels: Moderate loss of disc height at C6-C7 with endplate spurring and spondylotic disc bulging. Remaining discs are well preserved in height. No convincing disc herniation. Upper chest: No acute findings.  Clear lung apices. Other: None. IMPRESSION: HEAD CT 1. No acute intracranial abnormalities. 2. Mild chronic microvascular ischemic change. 3. Chronic moderate mucosal thickening in the left maxillary sinus. CERVICAL CT 1. No fracture or acute finding. Electronically Signed   By: Lajean Manes M.D.   On: 09/09/2018 13:41   Dg Knee Complete 4 Views Left  Result Date: 09/09/2018 CLINICAL DATA:  Left knee pain after multiple falls. EXAM: LEFT KNEE - COMPLETE 4+ VIEW COMPARISON:  None. FINDINGS: Status post left total knee arthroplasty. The femoral and tibial components appear to be well situated. Vascular calcifications are noted. No fracture or dislocation is noted. IMPRESSION: Status post left total knee arthroplasty. No acute abnormality is noted. Electronically Signed   By: Marijo Conception M.D.   On: 09/09/2018 13:12   Dg Hip Unilat W Or Wo Pelvis 2-3 Views Left  Result Date: 09/09/2018 CLINICAL DATA:  Left leg pain after multiple falls. EXAM: DG HIP (WITH OR WITHOUT PELVIS) 2-3V LEFT COMPARISON:  None. FINDINGS: There is no evidence of hip fracture or dislocation. There is no evidence of arthropathy or other  focal bone abnormality. IMPRESSION: Negative. Electronically Signed   By: Marijo Conception M.D.   On: 09/09/2018 13:10    Procedures Procedures (including critical care time)  Medications Ordered in ED Medications - No data to display   Initial Impression / Assessment and Plan / ED Course  I have reviewed the triage vital signs and the nursing notes.  Pertinent labs & imaging results that were available during my care of the patient were reviewed by me and considered in my medical decision making (see chart for details).  73 year old female appears otherwise well presents for evaluation after mechanical fall which occurred 4 days PTA.  She is afebrile, nonseptic, non-ill-appearing.  History of Parkinson's and dementia.  She is able to provide some history however unable to obtain complete HPI.  Patient oriented to person however not place and time.  She thinks the year is 2001.  Patient denies hitting head however does have mild old abrasion to left temporal region of head.  Moves all 4 extremities without difficulty.  No shortening or  rotation of legs.  Mild tenderness palpation to her left hip and left lateral knee.  She is neurovascularly intact without neurologic deficit.  Will obtain CT head, cervical spine, plain film left hip/pelvis, left knee and reevaluate.  She has no current pain at this time.  Imaging personally reviewed\ DG left knee without acute findings DG left hip/pelvis without acute findings CT head/CT cervical without acute findings.  Patient ambulatory with nursing staff to restroom without difficulty.  Patient with elevated blood pressure however has not taken her blood pressure medicine today.  She does not want this currently.  Discussed with facility that she needs to have this when she gets back to her nursing facility.  She is stable for discharge home.  She is tolerating p.o. intake without difficulty.  Has a nonfocal neurologic exam without neurologic deficits.   Ambulatory at baseline.  The patient has been appropriately medically screened and/or stabilized in the ED. I have low suspicion for any other emergent medical condition which would require further screening, evaluation or treatment in the ED or require inpatient management.  Patient is hemodynamically stable and in no acute distress.  Patient able to ambulate in department prior to ED.  Evaluation does not show acute pathology that would require ongoing or additional emergent interventions while in the emergency department or further inpatient treatment.  I have discussed the diagnosis with the patient and answered all questions.  Pain is been managed while in the emergency department and patient has no further complaints prior to discharge.  Patient is comfortable with plan discussed in room and is stable for discharge at this time.  I have discussed strict return precautions for returning to the emergency department.  Patient was encouraged to follow-up with PCP/specialist refer to at discharge.     Final Clinical Impressions(s) / ED Diagnoses   Final diagnoses:  Fall, initial encounter  Left hip pain  Acute pain of left knee    ED Discharge Orders    None       Byanka Landrus A, PA-C 09/09/18 1408    Milton Ferguson, MD 09/10/18 [phone]

## 2018-09-09 NOTE — ED Notes (Signed)
Assisted patient back to her bed. 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 Used therapeutic communication to calm pt down.

## 2018-10-06 ENCOUNTER — Other Ambulatory Visit: Payer: Self-pay

## 2018-10-06 NOTE — Patient Outreach (Signed)
[facility]) Care Management  10/06/2018  [name] Oct 18, 1945 [id number]   Medication Adherence call to [name] patient is at a rehab at this time and they provide with all her medications. [name] is showing past due on Simvastatin 10 mg under [place].   [place] Management Direct Dial [phone]  Fax [phone] [email or web address]

## 2018-10-19 ENCOUNTER — Emergency Department (HOSPITAL_COMMUNITY)
Admission: EM | Admit: 2018-10-19 | Discharge: 2018-10-19 | Disposition: A | Discharge status: Home / Self Care | Payer: Medicare Other | Attending: Emergency Medicine | Admitting: Emergency Medicine

## 2018-10-19 ENCOUNTER — Emergency Department (HOSPITAL_COMMUNITY): Payer: Medicare Other

## 2018-10-19 ENCOUNTER — Other Ambulatory Visit: Payer: Self-pay

## 2018-10-19 ENCOUNTER — Encounter (HOSPITAL_COMMUNITY): Payer: Self-pay

## 2018-10-19 DIAGNOSIS — Z794 Long term (current) use of insulin: Secondary | ICD-10-CM | POA: Diagnosis not present

## 2018-10-19 DIAGNOSIS — Y999 Unspecified external cause status: Secondary | ICD-10-CM | POA: Diagnosis not present

## 2018-10-19 DIAGNOSIS — I110 Hypertensive heart disease with heart failure: Secondary | ICD-10-CM | POA: Diagnosis not present

## 2018-10-19 DIAGNOSIS — E1143 Type 2 diabetes mellitus with diabetic autonomic (poly)neuropathy: Secondary | ICD-10-CM | POA: Diagnosis not present

## 2018-10-19 DIAGNOSIS — W07XXXA Fall from chair, initial encounter: Secondary | ICD-10-CM | POA: Insufficient documentation

## 2018-10-19 DIAGNOSIS — W19XXXA Unspecified fall, initial encounter: Secondary | ICD-10-CM

## 2018-10-19 DIAGNOSIS — Z79899 Other long term (current) drug therapy: Secondary | ICD-10-CM | POA: Insufficient documentation

## 2018-10-19 DIAGNOSIS — Y92129 Unspecified place in nursing home as the place of occurrence of the external cause: Secondary | ICD-10-CM | POA: Insufficient documentation

## 2018-10-19 DIAGNOSIS — S098XXA Other specified injuries of head, initial encounter: Secondary | ICD-10-CM | POA: Diagnosis present

## 2018-10-19 DIAGNOSIS — G20 Parkinson's disease: Secondary | ICD-10-CM | POA: Insufficient documentation

## 2018-10-19 DIAGNOSIS — I5032 Chronic diastolic (congestive) heart failure: Secondary | ICD-10-CM | POA: Diagnosis not present

## 2018-10-19 DIAGNOSIS — Y939 Activity, unspecified: Secondary | ICD-10-CM | POA: Diagnosis not present

## 2018-10-19 DIAGNOSIS — I25810 Atherosclerosis of coronary artery bypass graft(s) without angina pectoris: Secondary | ICD-10-CM | POA: Diagnosis not present

## 2018-10-19 DIAGNOSIS — R10819 Abdominal tenderness, unspecified site: Secondary | ICD-10-CM | POA: Insufficient documentation

## 2018-10-19 DIAGNOSIS — S0990XA Unspecified injury of head, initial encounter: Secondary | ICD-10-CM

## 2018-10-19 LAB — CBC WITH DIFFERENTIAL/PLATELET
Abs Immature Granulocytes: 0.1 10*3/uL — ABNORMAL HIGH (ref 0.00–0.07)
Basophils Absolute: 0 10*3/uL (ref 0.0–0.1)
Basophils Relative: 1 %
Eosinophils Absolute: 0.1 10*3/uL (ref 0.0–0.5)
Eosinophils Relative: 2 %
HCT: 35.4 % — ABNORMAL LOW (ref 36.0–46.0)
Hemoglobin: 12.1 g/dL (ref 12.0–15.0)
Immature Granulocytes: 2 %
Lymphocytes Relative: 14 %
Lymphs Abs: 0.9 10*3/uL (ref 0.7–4.0)
MCH: 30.3 pg (ref 26.0–34.0)
MCHC: 34.2 g/dL (ref 30.0–36.0)
MCV: 88.5 fL (ref 80.0–100.0)
Monocytes Absolute: 0.6 10*3/uL (ref 0.1–1.0)
Monocytes Relative: 10 %
Neutro Abs: 4.5 10*3/uL (ref 1.7–7.7)
Neutrophils Relative %: 71 %
Platelets: 314 10*3/uL (ref 150–400)
RBC: 4 MIL/uL (ref 3.87–5.11)
RDW: 13.2 % (ref 11.5–15.5)
WBC: 6.3 10*3/uL (ref 4.0–10.5)
nRBC: 0 % (ref 0.0–0.2)

## 2018-10-19 LAB — COMPREHENSIVE METABOLIC PANEL
ALT: 16 U/L (ref 0–44)
AST: 16 U/L (ref 15–41)
Albumin: 2.8 g/dL — ABNORMAL LOW (ref 3.5–5.0)
Alkaline Phosphatase: 74 U/L (ref 38–126)
Anion gap: 9 (ref 5–15)
BUN: 25 mg/dL — ABNORMAL HIGH (ref 8–23)
CO2: 28 mmol/L (ref 22–32)
Calcium: 9.1 mg/dL (ref 8.9–10.3)
Chloride: 100 mmol/L (ref 98–111)
Creatinine, Ser: 0.81 mg/dL (ref 0.44–1.00)
GFR calc Af Amer: >60 mL/min (ref >60)
GFR calc non Af Amer: >60 mL/min (ref >60)
Glucose, Bld: 448 mg/dL — ABNORMAL HIGH (ref 70–99)
Potassium: 3.8 mmol/L (ref 3.5–5.1)
Sodium: 137 mmol/L (ref 135–145)
Total Bilirubin: 0.5 mg/dL (ref 0.3–1.2)
Total Protein: 6.3 g/dL — ABNORMAL LOW (ref 6.5–8.1)

## 2018-10-19 LAB — LIPASE, BLOOD: Lipase: 20 U/L (ref 11–51)

## 2018-10-19 MED ORDER — IOHEXOL 300 MG/ML  SOLN
100.0000 mL | Freq: Once | INTRAMUSCULAR | Status: AC | PRN
  Administered 2018-10-19: 08:00:00 100 mL via INTRAVENOUS

## 2018-10-19 MED ORDER — INSULIN ASPART 100 UNIT/ML ~~LOC~~ SOLN
10.0000 [IU] | Freq: Once | SUBCUTANEOUS | Status: AC
  Administered 2018-10-19: 10 [IU] via SUBCUTANEOUS
  Filled 2018-10-19: qty 1

## 2018-10-19 NOTE — ED Notes (Signed)
EMS in room now

## 2018-10-19 NOTE — ED Triage Notes (Signed)
Pt arrived via ems for fall. Pt from [place] facility alzheimer's unit. Pt was dx with COVID on the 13th per ems. Has hematoma to right temple. On blood thinners. Denies pain.

## 2018-10-19 NOTE — ED Notes (Signed)
Called [facility] back to get ETA for transport back to [facility].  Per sceduler it will be about 45 minutes.

## 2018-10-19 NOTE — ED Notes (Signed)
Patient transported to CT 

## 2018-10-19 NOTE — ED Provider Notes (Signed)
[facility] EMERGENCY DEPARTMENT Provider Note   CSN: [id number] Arrival date & time: 10/19/18  [id number]     History   Chief Complaint Chief Complaint  Patient presents with  . Fall    HPI [name] is a 73 y.o. female.     Level 5 caveat for dementia.  Patient from nursing facility after fall.  She apparently fell out of a chair striking the right side of her head.  She does have a hematoma to her right temple.  It is reported that she takes blood thinners.  Patient denies any pain.  She denies any difficulty breathing.  She does not recall what happened.  She is oriented to person and place.  She complains of no pain.  Notably she tested positive for coronavirus on August 13 at her facility.  She denies any trouble breathing.  Denies any abdominal pain except with palpation. No known nausea, vomiting or diarrhea.  No pain with urination or blood in the urine.  The history is provided by the patient and the EMS personnel.  Fall    Past Medical History:  Diagnosis Date  . Acoustic neuroma (HCC)    left ear  . 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 Sonny Dandy)  . Anginal pain ([place])    DR Theola Sequin    . Anxiety   . Asthma    AS CHILD   . Blurred vision   . Coronary artery disease    a. s/p stent to lad 2006 w subsequent kissing balloon pci to diagonal;  b.  pci of the lad w a drug-eluting stent July 2010;  c.  LHC 11/13/11: p-mLAD stent with prox 50% ISR, 80% beyond dist edge of stent, m+dLAD beyond stent 40-50%, mod caliber pD1 80%, mRCA 70%, m-dRCA 20-30%, EF 55-65% => med Rx (consider CABG)  . Depressed   . Diabetes mellitus   . Dyslipidemia   . Fibromyalgia   . Gastroparesis   . GERD (gastroesophageal reflux disease)   . Headache(784.0)   . Hiatal hernia   . Hypertension   . Morbid obesity ([place])   . Osteoarthritis   . Parkinson disease ([place])    ambulates with cane  . Sleep apnea    12+ YRS NO  MACHINE GOTTEN   . Varicose vein     Patient Active Problem List   Diagnosis Date Noted  . Delirium 02/14/2016  . Lactic acid increased 02/14/2016  . Cellulitis 03/26/2013  . Vomiting 02/08/2012  . 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 Recurrent ventral incisional hernia 09/26/2010  . Obesity, Class III, BMI 40-49.9 (morbid obesity) ([place]) 09/26/2010  . HYPERTENSION, BENIGN 02/06/2009  . PERSONAL HISTORY MALIG NEOPLASM LARGE INTESTINE 12/14/2008  . COUGH 10/26/2008  . DIASTOLIC HEART FAILURE, CHRONIC 09/27/2008  . CAD, NATIVE VESSEL 09/14/2008  . CHEST PAIN UNSPECIFIED 09/13/2008  . Malignant neoplasm of ascending colon ([place]) 12/07/2007  . BENIGN NEOPLASM OF COLON 09/23/2007  . Diabetes ([place]) 09/23/2007  . IRON DEFICIENCY 09/23/2007  . GASTROPARESIS 09/23/2007  . FIBROMYALGIA 09/23/2007  . FECAL OCCULT BLOOD 09/23/2007    Past Surgical History:  Procedure Laterality Date  . ABDOMINAL HYSTERECTOMY  1993  . APPENDECTOMY    . APPLICATION OF WOUND VAC N/A 03/29/2013   Procedure: APPLICATION OF WOUND VAC;  Surgeon: Jamesetta So, MD;  Location: [facility];  Service: General;  Laterality: N/A;  . BACK SURGERY    . CARDIAC  CATHETERIZATION    . CATARACT EXTRACTION    . CHOLECYSTECTOMY  1966  . COLON SURGERY    . COLONOSCOPY N/A 09/21/2013   Procedure: COLONOSCOPY;  Surgeon: Inda Castle, MD;  Location: [facility];  Service: Endoscopy;  Laterality: N/A;  . CORONARY ARTERY BYPASS GRAFT  01/20/2012   Procedure: CORONARY ARTERY BYPASS GRAFTING (CABG);  Surgeon: Gaye Pollack, MD;  Location: [place];  Service: Open Heart Surgery;  Laterality: N/A;  Coronary artery bypass graft times three utilizing the left internal mammary artery and the right greater saphenous vein harvested endoscopically  . ENDOVEIN HARVEST OF GREATER SAPHENOUS VEIN  01/20/2012   Procedure: ENDOVEIN HARVEST OF GREATER SAPHENOUS VEIN;  Surgeon: Gaye Pollack, MD;  Location: [place];  Service: Open Heart Surgery;  Laterality:  Right;  . ESOPHAGOGASTRODUODENOSCOPY N/A 04/19/2014   Procedure: ESOPHAGOGASTRODUODENOSCOPY (EGD);  Surgeon: Jamesetta So, MD;  [facility];  Service: Gastroenterology;  Laterality: N/A;  . EXCISION MORTON'S NEUROMA    . EYE SURGERY     cataract on right 2005, left 2008  . FETAL SURGERY FOR CONGENITAL HERNIA    . FINGER REPLANTATION     RECENTLY   . HEMICOLECTOMY  12/28/07   right for adenocarcinoma of ascending colon  . [place], 2002, 2007   open x1, lap x2   . herniaorraphy  01/22/06   for recurrent ventral hernia  . HYSTEROTOMY  1993  . INCISION AND DRAINAGE ABSCESS N/A 03/29/2013   Procedure: INCISION AND DRAINAGE ABDOMINAL WALL ABSCESS;  Surgeon: Jamesetta So, MD;  Location: [facility];  Service: General;  Laterality: N/A;  . JOINT REPLACEMENT  2005 and 2006   right 2005, left 2006  . KNEE ARTHROSCOPY    . LAPAROTOMY     for small bowl obstruction  . LEFT HEART CATHETERIZATION WITH CORONARY ANGIOGRAM N/A 11/13/2011   Procedure: LEFT HEART CATHETERIZATION WITH CORONARY ANGIOGRAM;  Surgeon: Sherren Mocha, MD;  Location: [facility] CATH LAB;  Service: Cardiovascular;  Laterality: N/A;  . LESION REMOVAL  10/08/2011   Procedure: LESION REMOVAL;  Surgeon: Sanjuana Kava, MD;  Location: [facility];  Service: Orthopedics;  Laterality: Right;  Removal Lesion Right Long Finger  . MANDIBLE SURGERY    . PORT-A-CATH REMOVAL Right 06/17/2014   Procedure: REMOVAL PORT-A-CATH;  Surgeon: Aviva Signs Md, MD;  Location: [facility];  Service: General;  Laterality: Right;  . PORTACATH PLACEMENT    . REPLACEMENT TOTAL KNEE    . resecton of acoustic neuroma    . stent surgery    . TONSILLECTOMY       OB History    Gravida      Para      Term      Preterm      AB      Living  0     SAB      TAB      Ectopic      Multiple      Live Births               Home Medications    Prior to Admission medications   Medication Sig Start Date End Date Taking? Authorizing  Provider  AMINO ACIDS-PROTEIN HYDROLYS PO Take 30 mLs by mouth daily.    [provider]  Cholecalciferol (VITAMIN D3) 50 MCG (2000 UT) TABS Take 1 tablet by mouth daily.    [provider]  clonazePAM (KLONOPIN) 0.5 MG tablet Take 0.5 mg by  mouth 3 (three) times daily as needed for anxiety.    [provider]  divalproex (DEPAKOTE) 250 MG DR tablet Take 250 mg by mouth 3 (three) times daily.    [provider]  donepezil (ARICEPT) 5 MG tablet Take 5 mg by mouth daily.     [provider]  HUMALOG KWIKPEN 100 UNIT/ML KiwkPen Inject 6 Units into the skin 3 (three) times daily.  05/02/16   [provider]  insulin detemir (LEVEMIR) 100 UNIT/ML injection Inject 20 Units into the skin 2 (two) times daily.     [provider]  lisinopril (ZESTRIL) 2.5 MG tablet Take 2.5 mg by mouth daily.    [provider]  Multiple Vitamins-Minerals (MULTIVITAMIN WITH MINERALS) tablet Take 1 tablet by mouth daily.    [provider]  Omega-3 Fatty Acids (FISH OIL) 1000 MG CAPS Take 1 capsule by mouth 2 (two) times a day.    [provider]  risperiDONE (RISPERDAL) 0.25 MG tablet Take 0.25 mg by mouth 2 (two) times daily.    [provider]  sertraline (ZOLOFT) 100 MG tablet Take 100 mg by mouth daily.    [provider]  simvastatin (ZOCOR) 10 MG tablet Take 10 mg by mouth at bedtime.    [provider]  vitamin B-12 (CYANOCOBALAMIN) 1000 MCG tablet Take 1,000 mcg by mouth daily.    [provider]  vitamin C (ASCORBIC ACID) 500 MG tablet Take 500 mg by mouth daily.    [provider]    Family History Family History  Problem Relation Age of Onset  . Diabetes Maternal Grandmother        unspecif if Maternal or Paternal  . Heart failure Mother   . Cirrhosis Father   . Coronary artery disease Other        family history  . Asthma Other        family history-grandfather  . Colon  cancer Other        family hx    Social History Social History   Tobacco Use  . Smoking status: Never Smoker  . Smokeless tobacco: Never Used  Substance Use Topics  . Alcohol use: No    Comment: occasional  . Drug use: No     Allergies   Latex, Morphine, Adhesive [tape], Codeine, and Penicillins   Review of Systems Review of Systems  Unable to perform ROS: Dementia     Physical Exam Updated Vital Signs BP (!) 168/68   Pulse 79   Temp 98.6 F (37 C) (Oral)   Resp 19   Ht 5\' 5"  (1.651 m)   Wt 81.6 kg   SpO2 97%   BMI 29.94 kg/m   Physical Exam Vitals signs and nursing note reviewed.  Constitutional:      General: She is not in acute distress.    Appearance: She is well-developed. She is obese.  HENT:     Head: Normocephalic and atraumatic.     Comments: Hematoma to right temple.    Nose:     Comments: No septal hematoma or hemotympanum    Mouth/Throat:     Pharynx: No oropharyngeal exudate.  Eyes:     Conjunctiva/sclera: Conjunctivae normal.     Pupils: Pupils are equal, round, and reactive to light.  Neck:     Musculoskeletal: Normal range of motion and neck supple.     Comments: No C-spine tenderness Cardiovascular:     Rate and Rhythm: Normal rate and regular  rhythm.     Heart sounds: Normal heart sounds. No murmur.  Pulmonary:     Effort: Pulmonary effort is normal. No respiratory distress.     Breath sounds: Normal breath sounds.  Abdominal:     Palpations: Abdomen is soft.     Tenderness: There is abdominal tenderness. There is no guarding or rebound.     Comments: Soft abdomen, mild diffuse tenderness with voluntary guarding, pelvis stable  Musculoskeletal: Normal range of motion.        General: No tenderness.     Comments: Full range of motion of hips bilaterally without pain  Skin:    General: Skin is warm.  Neurological:     Mental Status: She is alert.     Cranial Nerves: No cranial nerve deficit.     Motor: No abnormal muscle  tone.     Coordination: Coordination normal.     Comments: Oriented to person and place.  Cranial nerves II to XII intact, 5/5 strength of bilateral arms and legs.  Psychiatric:        Behavior: Behavior normal.      ED Treatments / Results  Labs (all labs ordered are listed, but only abnormal results are displayed) Labs Reviewed  CBC WITH DIFFERENTIAL/PLATELET - Abnormal; Notable for the following components:      Result Value   HCT 35.4 (*)    Abs Immature Granulocytes 0.10 (*)    All other components within normal limits  COMPREHENSIVE METABOLIC PANEL - Abnormal; Notable for the following components:   Glucose, Bld 448 (*)    BUN 25 (*)    Total Protein 6.3 (*)    Albumin 2.8 (*)    All other components within normal limits  LIPASE, BLOOD  URINALYSIS, ROUTINE W REFLEX MICROSCOPIC    EKG None  Radiology Dg Chest Portable 1 View  Result Date: 10/19/2018 CLINICAL DATA:  Fall. EXAM: PORTABLE CHEST 1 VIEW COMPARISON:  02/14/2016. FINDINGS: Prior CABG. Cardiomegaly with pulmonary venous congestion. Mild bibasilar interstitial prominence. Mild component CHF cannot be excluded. Low lung volumes. Small left pleural effusion cannot be excluded. No pneumothorax. No acute bony abnormality identified. IMPRESSION: 1. Prior CABG. Cardiomegaly with mild pulmonary venous congestion. Mild bibasilar interstitial prominence small left pleural effusion cannot be excluded. Mild component CHF cannot be excluded. 2.  Low lung volumes. Electronically Signed   By: Marcello Moores  Register   On: 10/19/2018 06:30    Procedures Procedures (including critical care time)  Medications Ordered in ED Medications - No data to display   Initial Impression / Assessment and Plan / ED Course  I have reviewed the triage vital signs and the nursing notes.  Pertinent labs & imaging results that were available during my care of the patient were reviewed by me and considered in my medical decision making (see chart  for details).     Fall with head injury.  Patient is coronavirus positive.  She is in no respiratory distress and is afebrile.  Labs show hyperglycemia without DKA.  Chest x-ray shows mild congestion but likely secondary to her coronavirus infection.  We will need to obtain CT imaging of head and neck given her fall and anticoagulation.  Also obtain CT abdomen and she appears to have some abdominal pain to palpation.  Care will be transferred to oncoming physician at shift change.  Lakaia Lesch Stembridge was evaluated in Emergency Department on 10/19/2018 for the symptoms described in the history of present illness. She was evaluated in the context  of the global COVID-19 pandemic, which necessitated consideration that the patient might be at risk for infection with the SARS-CoV-2 virus that causes COVID-19. Institutional protocols and algorithms that pertain to the evaluation of patients at risk for COVID-19 are in a state of rapid change based on information released by regulatory bodies including the CDC and federal and state organizations. These policies and algorithms were followed during the patient's care in the ED.   Final Clinical Impressions(s) / ED Diagnoses   Final diagnoses:  None    ED Discharge Orders    None       Verdon Ferrante, Annie Main, MD 10/19/18 [phone]

## 2018-10-19 NOTE — ED Notes (Signed)
Pt hard to redirect. Pt keeps gettig up out of bed and coming to the door. Patient placed back in the bed. Pt peed in toilet.

## 2018-10-19 NOTE — Discharge Instructions (Addendum)
Follow up with your md this week for recheck  °

## 2019-07-02 ENCOUNTER — Encounter (HOSPITAL_BASED_OUTPATIENT_CLINIC_OR_DEPARTMENT_OTHER): Payer: Medicare Other | Attending: Internal Medicine | Admitting: Internal Medicine

## 2019-07-02 DIAGNOSIS — G309 Alzheimer's disease, unspecified: Secondary | ICD-10-CM | POA: Insufficient documentation

## 2019-07-02 DIAGNOSIS — I110 Hypertensive heart disease with heart failure: Secondary | ICD-10-CM | POA: Diagnosis not present

## 2019-07-02 DIAGNOSIS — L97522 Non-pressure chronic ulcer of other part of left foot with fat layer exposed: Secondary | ICD-10-CM | POA: Diagnosis not present

## 2019-07-02 DIAGNOSIS — F0281 Dementia in other diseases classified elsewhere with behavioral disturbance: Secondary | ICD-10-CM | POA: Insufficient documentation

## 2019-07-02 DIAGNOSIS — E11621 Type 2 diabetes mellitus with foot ulcer: Secondary | ICD-10-CM | POA: Insufficient documentation

## 2019-07-02 DIAGNOSIS — G20 Parkinson's disease: Secondary | ICD-10-CM | POA: Insufficient documentation

## 2019-07-02 DIAGNOSIS — I509 Heart failure, unspecified: Secondary | ICD-10-CM | POA: Insufficient documentation

## 2019-07-02 DIAGNOSIS — F0151 Vascular dementia with behavioral disturbance: Secondary | ICD-10-CM | POA: Diagnosis not present

## 2019-07-06 NOTE — Progress Notes (Signed)
[name], [name] ([id number]) Visit Report for 07/02/2019 Chief Complaint Document Details Patient Name: Date of Service: [name], [name] 07/02/2019 2:45 PM Medical Record Number: [id number] Patient Account Number: [id number] Date of Birth/Sex: Treating RN: 22-Apr-1945 (74 y.o. Female) Baruch Gouty Primary Care Provider: Royetta Asal, [facility] NDO Other Clinician: Referring Provider: Treating Provider/Extender: Redmond Baseman, [facility] NDO Weeks in Treatment: 0 Information Obtained from: Patient Chief Complaint 07/02/2019; patient is here for review of concerning areas on the left foot x3 and the right foot x1. Electronic Signature(s) Signed: 07/02/2019 4:59:38 PM By: Linton Ham MD Entered By: Linton Ham on 07/02/2019 16:45:36 -------------------------------------------------------------------------------- Debridement Details Patient Name: Date of Service: [name] 07/02/2019 2:45 PM Medical Record Number: [id number] Patient Account Number: [id number] Date of Birth/Sex: Treating RN: 1945/12/08 (74 y.o. Female) Baruch Gouty Primary Care Provider: Royetta Asal, [facility] NDO Other Clinician: Referring Provider: Treating Provider/Extender: Redmond Baseman, [facility] NDO Weeks in Treatment: 0 Debridement Performed for Assessment: Wound #1 Left Metatarsal head first Performed By: Physician Ricard Dillon., MD Debridement Type: Debridement Severity of Tissue Pre Debridement: Fat layer exposed Level of Consciousness (Pre-procedure): Awake and Alert Pre-procedure Verification/Time Out Yes - 16:20 Taken: Start Time: 16:20 T Area Debrided (L x W): otal 1.3 (cm) x 1.3 (cm) = 1.69 (cm) Tissue and other material debrided: Non-Viable, Callus, Skin: Epidermis Level: Skin/Epidermis Debridement Description: Selective/Open Wound Instrument: Blade, Curette Bleeding: Minimum Hemostasis Achieved: Pressure End Time: 16:25 Procedural Pain: 0 Post Procedural Pain: 0 Response to Treatment:  Procedure was tolerated well Level of Consciousness (Post- Awake and Alert procedure): Post Debridement Measurements of Total Wound Length: (cm) 0.5 Width: (cm) 0.2 Depth: (cm) 0.3 Volume: (cm) 0.024 Character of Wound/Ulcer Post Debridement: Improved Severity of Tissue Post Debridement: Fat layer exposed Post Procedure Diagnosis Same as Pre-procedure Electronic Signature(s) Signed: 07/02/2019 4:59:38 PM By: Linton Ham MD Signed: 07/02/2019 5:35:25 PM By: Baruch Gouty RN, BSN Entered By: Linton Ham on 07/02/2019 16:44:31 -------------------------------------------------------------------------------- Debridement Details Patient Name: Date of Service: [name] 07/02/2019 2:45 PM Medical Record Number: [id number] Patient Account Number: [id number] Date of Birth/Sex: Treating RN: 1945/12/05 (74 y.o. Female) Baruch Gouty Primary Care Provider: Royetta Asal, [facility] NDO Other Clinician: Referring Provider: Treating Provider/Extender: Redmond Baseman, [facility] NDO Weeks in Treatment: 0 Debridement Performed for Assessment: Wound #2 Left T Third oe Performed By: Physician Ricard Dillon., MD Debridement Type: Debridement Severity of Tissue Pre Debridement: Fat layer exposed Level of Consciousness (Pre-procedure): Awake and Alert Pre-procedure Verification/Time Out Yes - 16:20 Taken: Start Time: 16:20 T Area Debrided (L x W): otal 0.6 (cm) x 0.6 (cm) = 0.36 (cm) Tissue and other material debrided: Non-Viable, Callus, Skin: Epidermis Level: Skin/Epidermis Debridement Description: Selective/Open Wound Instrument: Blade, Curette Bleeding: Minimum Hemostasis Achieved: Pressure End Time: 16:25 Procedural Pain: 0 Post Procedural Pain: 0 Response to Treatment: Procedure was tolerated well Level of Consciousness (Post- Awake and Alert procedure): Post Debridement Measurements of Total Wound Length: (cm) 0.3 Width: (cm) 0.1 Depth: (cm) 0.1 Volume: (cm)  0.002 Character of Wound/Ulcer Post Debridement: Improved Severity of Tissue Post Debridement: Fat layer exposed Post Procedure Diagnosis Same as Pre-procedure Electronic Signature(s) Signed: 07/02/2019 4:59:38 PM By: Linton Ham MD Signed: 07/02/2019 5:35:25 PM By: Baruch Gouty RN, BSN Entered By: Linton Ham on 07/02/2019 16:44:40 -------------------------------------------------------------------------------- HPI Details Patient Name: Date of Service: [name] 07/02/2019 2:45 PM Medical Record Number: [id number] Patient Account Number: [id number] Date of Birth/Sex: Treating RN: January 14, 1946 (74 y.o. Female) Baruch Gouty Primary Care  Provider: Royetta Asal, [facility] NDO Other Clinician: Referring Provider: Treating Provider/Extender: Redmond Baseman, [facility] NDO Weeks in Treatment: 0 History of Present Illness HPI Description: ADMISSION 07/02/2019. This is a 74 year old woman who was currently living in the locked unit at [facility]. She is listed as having Alzheimer's disease with behavioral disturbances perhaps some parkinsonism. She has a history of falling and probably wandering. She has been wearing gripping socks on both feet, not clear that she is actually wearing footwear. The facility has been dealing with thickly callused areas on the left first plantar met head, left fifth plantar met head, left plantar third toe tip and an area on the right lateral foot. In terms of the major area on the left first met head this is been draining and bleeding apparently she has been on doxycycline. Apparently the in-house podiatrist will not debrided anything that has an open wound. This would be difficult to understand The patient has Alzheimer's disease, type 2 diabetes, she has a history of a left foot ulcer in March 2020, congestive heart failure, parkinsonism and peripheral vascular disease ABI on the left was 1.11 Electronic Signature(s) Signed: 07/02/2019  4:59:38 PM By: Linton Ham MD Entered By: Linton Ham on 07/02/2019 16:52:15 -------------------------------------------------------------------------------- Paring/cutting 1 benign hyperkeratotic lesion Details Patient Name: Date of Service: [name], [name] 07/02/2019 2:45 PM Medical Record Number: [id number] Patient Account Number: [id number] Date of Birth/Sex: Treating RN: [birth year] (74 y.o. Female) Baruch Gouty Primary Care Provider: Royetta Asal, [facility] NDO Other Clinician: Referring Provider: Treating Provider/Extender: Redmond Baseman, [facility] NDO Weeks in Treatment: 0 Procedure Performed for: Non-Wound Location Performed By: Physician Ricard Dillon., MD Post Procedure Diagnosis Same as Pre-procedure Notes left 5th met head using #3 curette Electronic Signature(s) Signed: 07/02/2019 4:59:38 PM By: Linton Ham MD Signed: 07/02/2019 5:35:25 PM By: Baruch Gouty RN, BSN Entered By: Baruch Gouty on 07/02/2019 16:29:29 -------------------------------------------------------------------------------- Physical Exam Details Patient Name: Date of Service: [name], [name] 07/02/2019 2:45 PM Medical Record Number: [id number] Patient Account Number: [id number] Date of Birth/Sex: Treating RN: [birth year] (74 y.o. Female) Baruch Gouty Primary Care Provider: Royetta Asal, [facility] NDO Other Clinician: Referring Provider: Treating Provider/Extender: Redmond Baseman, [facility] NDO Weeks in Treatment: 0 Constitutional Sitting or standing Blood Pressure is within target range for patient.. Pulse regular and within target range for patient.Marland Kitchen Respirations regular, non-labored and within target range.. Temperature is normal and within the target range for the patient.Marland Kitchen Appears in no distress. Respiratory work of breathing is normal. Cardiovascular Needle pulses are palpable bilaterally but not robust. Femoral pulses are palpable. Integumentary (Hair, Skin) No evidence of  surrounding infection. Thick callus over the left first met head, left third toe and left fifth met head. Psychiatric Patient has dementia but no other mood disturbance was obvious. Notes Wound exam; initially there was no obvious open wound however she had thick dark callus over the first met head. Using a #10 scalpel I removed some callus and then found a dark area I then used a #3 curette to remove thick tissue over the top of this to reveal an open wound. This is small but relative depth. No doubt this was the source of the bleeding that was observed I did the same approach on the fifth met head but I was unable to identify anything that was open. On the plantar tip of the third toe on the left again thick callus was removed there is a small open wound here. Nothing obvious on  the right lateral foot which was the other area of concern although I wonder whether we are having excessive pressure in this area as well Electronic Signature(s) Signed: 07/02/2019 4:59:38 PM By: Linton Ham MD Entered By: Linton Ham on 07/02/2019 16:51:50 -------------------------------------------------------------------------------- Physician Orders Details Patient Name: Date of Service: [name] 07/02/2019 2:45 PM Medical Record Number: [id number] Patient Account Number: [id number] Date of Birth/Sex: Treating RN: [birth year] (74 y.o. Female) Baruch Gouty Primary Care Provider: Royetta Asal, [facility] NDO Other Clinician: Referring Provider: Treating Provider/Extender: Redmond Baseman, [facility] NDO Weeks in Treatment: 0 Verbal / Phone Orders: No Diagnosis Coding Follow-up Appointments Return Appointment in 2 weeks. Dressing Change Frequency Wound #1 Left Metatarsal head first Change Dressing every other day. Wound #2 Left T Third oe Change Dressing every other day. Wound Cleansing Wound #1 Left Metatarsal head first Clean wound with Wound Cleanser Wound #2 Left T Third oe Clean wound with  Wound Cleanser Primary Wound Dressing Wound #1 Left Metatarsal head first Calcium Alginate with Silver Foam - foam donut Wound #2 Left T Third oe Calcium Alginate with Silver Foam - foam donut Secondary Dressing Wound #1 Left Metatarsal head first Kerlix/Rolled Gauze Dry Gauze Wound #2 Left T Third oe Kerlix/Rolled Gauze Dry Gauze Off-Loading Other: - pt to wear shoes, not slippers to ambulate Electronic Signature(s) Signed: 07/02/2019 4:59:38 PM By: Linton Ham MD Signed: 07/02/2019 5:35:25 PM By: Baruch Gouty RN, BSN Entered By: Baruch Gouty on 07/02/2019 16:32:07 -------------------------------------------------------------------------------- Problem List Details Patient Name: Date of Service: [name] 07/02/2019 2:45 PM Medical Record Number: [id number] Patient Account Number: [id number] Date of Birth/Sex: Treating RN: 01-29-1946 (74 y.o. Female) Baruch Gouty Primary Care Provider: Royetta Asal, [facility] NDO Other Clinician: Referring Provider: Treating Provider/Extender: Redmond Baseman, [facility] NDO Weeks in Treatment: 0 Active Problems ICD-10 Encounter Code Description Active Date MDM Diagnosis E11.621 Type 2 diabetes mellitus with foot ulcer 07/02/2019 No Yes L97.522 Non-pressure chronic ulcer of other part of left foot with fat layer exposed 07/02/2019 No Yes F01.51 Vascular dementia with behavioral disturbance 07/02/2019 No Yes Inactive Problems Resolved Problems Electronic Signature(s) Signed: 07/02/2019 4:59:38 PM By: Linton Ham MD Entered By: Linton Ham on 07/02/2019 16:44:02 -------------------------------------------------------------------------------- Progress Note Details Patient Name: Date of Service: [name] 07/02/2019 2:45 PM Medical Record Number: [id number] Patient Account Number: [id number] Date of Birth/Sex: Treating RN: January 04, 1946 (74 y.o. Female) Baruch Gouty Primary Care Provider: Royetta Asal, [facility] NDO Other  Clinician: Referring Provider: Treating Provider/Extender: Redmond Baseman, [facility] NDO Weeks in Treatment: 0 Subjective Chief Complaint Information obtained from Patient 07/02/2019; patient is here for review of concerning areas on the left foot x3 and the right foot x1. History of Present Illness (HPI) ADMISSION 07/02/2019. This is a 74 year old woman who was currently living in the locked unit at [facility]. She is listed as having Alzheimer's disease with behavioral disturbances perhaps some parkinsonism. She has a history of falling and probably wandering. She has been wearing gripping socks on both feet, not clear that she is actually wearing footwear. The facility has been dealing with thickly callused areas on the left first plantar met head, left fifth plantar met head, left plantar third toe tip and an area on the right lateral foot. In terms of the major area on the left first met head this is been draining and bleeding apparently she has been on doxycycline. Apparently the in-house podiatrist will not debrided anything that has an open wound. This would be  difficult to understand The patient has Alzheimer's disease, type 2 diabetes, she has a history of a left foot ulcer in March 2020, congestive heart failure, parkinsonism and peripheral vascular disease ABI on the left was 1.11 Patient History Information obtained from Patient. Allergies codeine, morphine, penicillin, adhesive tape Family History Unknown History. Social History Smoker, current status unknown, Marital Status - Single, Alcohol Use - Never, Drug Use - No History, Caffeine Use - Never. Medical History Eyes Denies history of Cataracts, Glaucoma, Optic Neuritis Ear/Nose/Mouth/Throat Denies history of Chronic sinus problems/congestion, Middle ear problems Hematologic/Lymphatic Denies history of Anemia, Hemophilia, Human Immunodeficiency Virus, Lymphedema, Sickle Cell  Disease Respiratory Denies history of Aspiration, Asthma, Chronic Obstructive Pulmonary Disease (COPD), Pneumothorax, Sleep Apnea, Tuberculosis Cardiovascular Patient has history of Congestive Heart Failure, Hypertension, Peripheral Venous Disease Denies history of Angina, Arrhythmia, Coronary Artery Disease, Deep Vein Thrombosis, Hypotension, Myocardial Infarction, Peripheral Arterial Disease, Phlebitis, Vasculitis Gastrointestinal Denies history of Cirrhosis , Colitis, Crohnoos, Hepatitis A, Hepatitis B, Hepatitis C Endocrine Patient has history of Type II Diabetes Denies history of Type I Diabetes Genitourinary Denies history of End Stage Renal Disease Immunological Denies history of Lupus Erythematosus, Raynaudoos, Scleroderma Integumentary (Skin) Denies history of History of Burn Musculoskeletal Denies history of Gout, Rheumatoid Arthritis, Osteoarthritis, Osteomyelitis Neurologic Patient has history of Dementia Denies history of Neuropathy, Quadriplegia, Paraplegia, Seizure Disorder Oncologic Denies history of Received Chemotherapy, Received Radiation Psychiatric Denies history of Anorexia/bulimia, Confinement Anxiety Patient is treated with Insulin, Oral Agents. Review of Systems (ROS) Constitutional Symptoms (General Health) Denies complaints or symptoms of Fatigue, Fever, Chills, Marked Weight Change. Eyes Denies complaints or symptoms of Dry Eyes, Vision Changes, Glasses / Contacts. Ear/Nose/Mouth/Throat Denies complaints or symptoms of Chronic sinus problems or rhinitis. Respiratory Denies complaints or symptoms of Chronic or frequent coughs, Shortness of Breath. Cardiovascular Denies complaints or symptoms of Chest pain. Gastrointestinal Denies complaints or symptoms of Frequent diarrhea, Nausea, Vomiting. Endocrine Denies complaints or symptoms of Heat/cold intolerance. Genitourinary Denies complaints or symptoms of Frequent urination. Integumentary  (Skin) Complains or has symptoms of Wounds. Musculoskeletal Denies complaints or symptoms of Muscle Pain, Muscle Weakness. Neurologic Denies complaints or symptoms of Numbness/parasthesias. Psychiatric Denies complaints or symptoms of Claustrophobia, Suicidal. Objective Constitutional Sitting or standing Blood Pressure is within target range for patient.. Pulse regular and within target range for patient.Marland Kitchen Respirations regular, non-labored and within target range.. Temperature is normal and within the target range for the patient.Marland Kitchen Appears in no distress. Vitals Time Taken: 3:33 PM, Height: 65 in, Source: Stated, Weight: 183 lbs, BMI: 30.4, Temperature: 98.2 F, Pulse: 84 bpm, Respiratory Rate: 18 breaths/min, Blood Pressure: 117/55 mmHg. Respiratory work of breathing is normal. Cardiovascular Needle pulses are palpable bilaterally but not robust. Femoral pulses are palpable. Psychiatric Patient has dementia but no other mood disturbance was obvious. General Notes: Wound exam; initially there was no obvious open wound however she had thick dark callus over the first met head. Using a #10 scalpel I removed some callus and then found a dark area I then used a #3 curette to remove thick tissue over the top of this to reveal an open wound. This is small but relative depth. No doubt this was the source of the bleeding that was observed ooI did the same approach on the fifth met head but I was unable to identify anything that was open. ooOn the plantar tip of the third toe on the left again thick callus was removed there is a small open wound here. ooNothing obvious on the  right lateral foot which was the other area of concern although I wonder whether we are having excessive pressure in this area as well Integumentary (Hair, Skin) No evidence of surrounding infection. Thick callus over the left first met head, left third toe and left fifth met head. Wound #1 status is Open. Original cause  of wound was Gradually Appeared. The wound is located on the Left Metatarsal head first. The wound measures 0.5cm length x 0.2cm width x 0.3cm depth; 0.079cm^2 area and 0.024cm^3 volume. There is Fat Layer (Subcutaneous Tissue) Exposed exposed. There is no tunneling or undermining noted. There is a none present amount of drainage noted. The wound margin is thickened. There is large (67-100%) red granulation within the wound bed. There is no necrotic tissue within the wound bed. Wound #2 status is Open. Original cause of wound was Gradually Appeared. The wound is located on the Left T Third. The wound measures 0.3cm length x oe 0.1cm width x 0.1cm depth; 0.024cm^2 area and 0.002cm^3 volume. There is Fat Layer (Subcutaneous Tissue) Exposed exposed. There is no tunneling or undermining noted. There is a small amount of sanguinous drainage noted. The wound margin is flat and intact. There is large (67-100%) red granulation within the wound bed. There is no necrotic tissue within the wound bed. Assessment Active Problems ICD-10 Type 2 diabetes mellitus with foot ulcer Non-pressure chronic ulcer of other part of left foot with fat layer exposed Vascular dementia with behavioral disturbance Procedures Wound #1 Pre-procedure diagnosis of Wound #1 is a Diabetic Wound/Ulcer of the Lower Extremity located on the Left Metatarsal head first .Severity of Tissue Pre Debridement is: Fat layer exposed. There was a Selective/Open Wound Skin/Epidermis Debridement with a total area of 1.69 sq cm performed by Ricard Dillon., MD. With the following instrument(s): Blade, and Curette to remove Non-Viable tissue/material. Material removed includes Callus and Skin: Epidermis and. No specimens were taken. A time out was conducted at 16:20, prior to the start of the procedure. A Minimum amount of bleeding was controlled with Pressure. The procedure was tolerated well with a pain level of 0 throughout and a pain level  of 0 following the procedure. Post Debridement Measurements: 0.5cm length x 0.2cm width x 0.3cm depth; 0.024cm^3 volume. Character of Wound/Ulcer Post Debridement is improved. Severity of Tissue Post Debridement is: Fat layer exposed. Post procedure Diagnosis Wound #1: Same as Pre-Procedure Wound #2 Pre-procedure diagnosis of Wound #2 is a Diabetic Wound/Ulcer of the Lower Extremity located on the Left T Third .Severity of Tissue Pre Debridement is: oe Fat layer exposed. There was a Selective/Open Wound Skin/Epidermis Debridement with a total area of 0.36 sq cm performed by Ricard Dillon., MD. With the following instrument(s): Blade, and Curette to remove Non-Viable tissue/material. Material removed includes Callus and Skin: Epidermis and. No specimens were taken. A time out was conducted at 16:20, prior to the start of the procedure. A Minimum amount of bleeding was controlled with Pressure. The procedure was tolerated well with a pain level of 0 throughout and a pain level of 0 following the procedure. Post Debridement Measurements: 0.3cm length x 0.1cm width x 0.1cm depth; 0.002cm^3 volume. Character of Wound/Ulcer Post Debridement is improved. Severity of Tissue Post Debridement is: Fat layer exposed. Post procedure Diagnosis Wound #2: Same as Pre-Procedure A Paring/cutting 1 benign hyperkeratotic lesion procedure was performed. by Ricard Dillon., MD. Post procedure Diagnosis Wound #: Same as Pre-Procedure Notes: left 5th met head using #3 curette Plan Follow-up Appointments: Return  Appointment in 2 weeks. Dressing Change Frequency: Wound #1 Left Metatarsal head first: Change Dressing every other day. Wound #2 Left T Third: oe Change Dressing every other day. Wound Cleansing: Wound #1 Left Metatarsal head first: Clean wound with Wound Cleanser Wound #2 Left T Third: oe Clean wound with Wound Cleanser Primary Wound Dressing: Wound #1 Left Metatarsal head first: Calcium  Alginate with Silver Foam - foam donut Wound #2 Left T Third: oe Calcium Alginate with Silver Foam - foam donut Secondary Dressing: Wound #1 Left Metatarsal head first: Kerlix/Rolled Gauze Dry Gauze Wound #2 Left T Third: oe Kerlix/Rolled Gauze Dry Gauze Off-Loading: Other: - pt to wear shoes, not slippers to ambulate 1. We are going to use silver alginate to the 2 open wound areas which is the first metatarsal head on the left and the left third toe 2. The other areas did not have anything specifically open. 3. The constellation of the calluses, falls etc. is a difficult set of factors to deal with. She is going to need some sort of offloading footwear to deal with development of calluses although this may make it difficult for her to walk and may increase the number of falls. 4. Think this lady has essential tremors I did a quick neurologic exam on her. It appears that some of her tremors are resting but they are markedly exaggerated by activity this can sometimes be a difficult distinction. She does not have any cogwheeling I spent 30 minutes in review of this patient's past history, face-to-face evaluation and preparation of this record Electronic Signature(s) Signed: 07/02/2019 4:59:38 PM By: Linton Ham MD Entered By: Linton Ham on 07/02/2019 16:54:28 -------------------------------------------------------------------------------- HxROS Details Patient Name: Date of Service: [name] 07/02/2019 2:45 PM Medical Record Number: [id number] Patient Account Number: [id number] Date of Birth/Sex: Treating RN: 11-28-1945 (74 y.o. Female) Carlene Coria Primary Care Provider: Royetta Asal, [facility] NDO Other Clinician: Referring Provider: Treating Provider/Extender: Redmond Baseman, [facility] NDO Weeks in Treatment: 0 Information Obtained From Patient Constitutional Symptoms (General Health) Complaints and Symptoms: Negative for: Fatigue; Fever; Chills; Marked Weight  Change Eyes Complaints and Symptoms: Negative for: Dry Eyes; Vision Changes; Glasses / Contacts Medical History: Negative for: Cataracts; Glaucoma; Optic Neuritis Ear/Nose/Mouth/Throat Complaints and Symptoms: Negative for: Chronic sinus problems or rhinitis Medical History: Negative for: Chronic sinus problems/congestion; Middle ear problems Respiratory Complaints and Symptoms: Negative for: Chronic or frequent coughs; Shortness of Breath Medical History: Negative for: Aspiration; Asthma; Chronic Obstructive Pulmonary Disease (COPD); Pneumothorax; Sleep Apnea; Tuberculosis Cardiovascular Complaints and Symptoms: Negative for: Chest pain Medical History: Positive for: Congestive Heart Failure; Hypertension; Peripheral Venous Disease Negative for: Angina; Arrhythmia; Coronary Artery Disease; Deep Vein Thrombosis; Hypotension; Myocardial Infarction; Peripheral Arterial Disease; Phlebitis; Vasculitis Gastrointestinal Complaints and Symptoms: Negative for: Frequent diarrhea; Nausea; Vomiting Medical History: Negative for: Cirrhosis ; Colitis; Crohns; Hepatitis A; Hepatitis B; Hepatitis C Endocrine Complaints and Symptoms: Negative for: Heat/cold intolerance Medical History: Positive for: Type II Diabetes Negative for: Type I Diabetes Treated with: Insulin, Oral agents Genitourinary Complaints and Symptoms: Negative for: Frequent urination Medical History: Negative for: End Stage Renal Disease Integumentary (Skin) Complaints and Symptoms: Positive for: Wounds Medical History: Negative for: History of Burn Musculoskeletal Complaints and Symptoms: Negative for: Muscle Pain; Muscle Weakness Medical History: Negative for: Gout; Rheumatoid Arthritis; Osteoarthritis; Osteomyelitis Neurologic Complaints and Symptoms: Negative for: Numbness/parasthesias Medical History: Positive for: Dementia Negative for: Neuropathy; Quadriplegia; Paraplegia; Seizure  Disorder Psychiatric Complaints and Symptoms: Negative for: Claustrophobia; Suicidal Medical History: Negative for: Anorexia/bulimia;  Confinement Anxiety Hematologic/Lymphatic Medical History: Negative for: Anemia; Hemophilia; Human Immunodeficiency Virus; Lymphedema; Sickle Cell Disease Immunological Medical History: Negative for: Lupus Erythematosus; Raynauds; Scleroderma Oncologic Medical History: Negative for: Received Chemotherapy; Received Radiation Immunizations Pneumococcal Vaccine: Received Pneumococcal Vaccination: No Implantable Devices None Family and Social History Unknown History: Yes; Smoker, current status unknown; Marital Status - Single; Alcohol Use: Never; Drug Use: No History; Caffeine Use: Never; Financial Concerns: No; Food, Clothing or Shelter Needs: No; Support System Lacking: No; Transportation Concerns: No Electronic Signature(s) Signed: 07/02/2019 4:59:38 PM By: Linton Ham MD Signed: 07/06/2019 5:53:12 PM By: Carlene Coria RN Entered By: Carlene Coria on 07/02/2019 15:38:57 -------------------------------------------------------------------------------- [place] Details Patient Name: Date of Service: [name] 07/02/2019 Medical Record Number: [id number] Patient Account Number: [id number] Date of Birth/Sex: Treating RN: Jan 06, 1946 (74 y.o. Female) Baruch Gouty Primary Care Provider: Royetta Asal, [facility] NDO Other Clinician: Referring Provider: Treating Provider/Extender: Redmond Baseman, [facility] NDO Weeks in Treatment: 0 Diagnosis Coding ICD-10 Codes Code Description E11.621 Type 2 diabetes mellitus with foot ulcer L97.522 Non-pressure chronic ulcer of other part of left foot with fat layer exposed F01.51 Vascular dementia with behavioral disturbance Facility Procedures CPT4 Code: [id number] Description: [phone] - DEBRIDE WOUND 1ST 20 SQ CM OR < ICD-10 Diagnosis Description L97.522 Non-pressure chronic ulcer of other part of left foot with  fat layer exposed Modifier: Quantity: 1 Physician Procedures : CPT4 Code Description Modifier [id number] WC PHYS LEVEL 3 NEW PT 25 ICD-10 Diagnosis Description E11.621 Type 2 diabetes mellitus with foot ulcer L97.522 Non-pressure chronic ulcer of other part of left foot with fat layer exposed F01.51 Vascular dementia  with behavioral disturbance Quantity: 1 : [id number] - WC PHYS DEBR WO ANESTH 20 SQ CM ICD-10 Diagnosis Description L97.522 Non-pressure chronic ulcer of other part of left foot with fat layer exposed Quantity: 1 Electronic Signature(s) Signed: 07/02/2019 4:59:38 PM By: Linton Ham MD Entered By: Linton Ham on 07/02/2019 16:54:53

## 2019-07-06 NOTE — Progress Notes (Signed)
NYKERRIA, STUART ([id number]) Visit Report for 07/02/2019 Allergy List Details Patient Name: Date of Service: [name], LIFTON 07/02/2019 2:45 PM Medical Record Number: [id number] Patient Account Number: [id number] Date of Birth/Sex: Treating RN: 1946/01/01 (74 y.o. Female) Carlene Coria Primary Care Don Tiu: Royetta Asal, [facility] NDO Other Clinician: Referring Dulcinea Kinser: Treating Ovide Dusek/Extender: Redmond Baseman, [facility] NDO Weeks in Treatment: 0 Allergies Active Allergies codeine morphine penicillin adhesive tape Allergy Notes Electronic Signature(s) Signed: 07/06/2019 5:53:12 PM By: Carlene Coria RN Entered By: Carlene Coria on 07/02/2019 15:35:03 -------------------------------------------------------------------------------- Arrival Information Details Patient Name: Date of Service: [name] 07/02/2019 2:45 PM Medical Record Number: [id number] Patient Account Number: [id number] Date of Birth/Sex: Treating RN: 07-05-1945 (74 y.o. Female) Carlene Coria Primary Care Lawren Sexson: Royetta Asal, [facility] NDO Other Clinician: Referring Maylin Freeburg: Treating Savina Olshefski/Extender: Redmond Baseman, [facility] NDO Weeks in Treatment: 0 Visit Information Patient Arrived: Wheel Chair Arrival Time: 15:32 Accompanied By: caregiver Transfer Assistance: None Patient Identification Verified: Yes Secondary Verification Process Completed: Yes Patient Requires Transmission-Based Precautions: No Patient Has Alerts: No Electronic Signature(s) Signed: 07/06/2019 5:53:12 PM By: Carlene Coria RN Entered By: Carlene Coria on 07/02/2019 15:33:19 -------------------------------------------------------------------------------- Clinic Level of Care Assessment Details Patient Name: Date of Service: JAIMYA, WANKO 07/02/2019 2:45 PM Medical Record Number: [id number] Patient Account Number: [id number] Date of Birth/Sex: Treating RN: 1945/09/03 (74 y.o. Female) Baruch Gouty Primary Care Aarthi Uyeno: Royetta Asal, [facility] NDO Other  Clinician: Referring Eean Buss: Treating Tavian Callander/Extender: Redmond Baseman, [facility] NDO Weeks in Treatment: 0 Clinic Level of Care Assessment Items TOOL 1 Quantity Score []  - 0 Use when EandM and Procedure is performed on INITIAL visit ASSESSMENTS - Nursing Assessment / Reassessment X- 1 20 General Physical Exam (combine w/ comprehensive assessment (listed just below) when performed on new pt. evals) X- 1 25 Comprehensive Assessment (HX, ROS, Risk Assessments, Wounds Hx, etc.) ASSESSMENTS - Wound and Skin Assessment / Reassessment []  - 0 Dermatologic / Skin Assessment (not related to wound area) ASSESSMENTS - Ostomy and/or Continence Assessment and Care []  - 0 Incontinence Assessment and Management []  - 0 Ostomy Care Assessment and Management (repouching, etc.) PROCESS - Coordination of Care X - Simple Patient / Family Education for ongoing care 1 15 []  - 0 Complex (extensive) Patient / Family Education for ongoing care X- 1 10 Staff obtains Programmer, systems, Records, T Results / Process Orders est X- 1 10 Staff telephones HHA, Nursing Homes / Clarify orders / etc []  - 0 Routine Transfer to another Facility (non-emergent condition) []  - 0 Routine Hospital Admission (non-emergent condition) X- 1 15 New Admissions / Biomedical engineer / Ordering NPWT Apligraf, etc. , []  - 0 Emergency Hospital Admission (emergent condition) PROCESS - Special Needs []  - 0 Pediatric / Minor Patient Management []  - 0 Isolation Patient Management []  - 0 Hearing / Language / Visual special needs []  - 0 Assessment of Community assistance (transportation, D/C planning, etc.) []  - 0 Additional assistance / Altered mentation []  - 0 Support Surface(s) Assessment (bed, cushion, seat, etc.) INTERVENTIONS - Miscellaneous []  - 0 External ear exam []  - 0 Patient Transfer (multiple staff / Civil Service fast streamer / Similar devices) []  - 0 Simple Staple / Suture removal (25 or less) []  - 0 Complex  Staple / Suture removal (26 or more) []  - 0 Hypo/Hyperglycemic Management (do not check if billed separately) []  - 0 Ankle / Brachial Index (ABI) - do not check if billed separately Has the patient been seen at the hospital within the last three years:  Yes Total Score: 95 Level Of Care: New/Established - Level 3 Electronic Signature(s) Signed: 07/02/2019 5:35:25 PM By: Baruch Gouty RN, BSN Entered By: Baruch Gouty on 07/02/2019 16:20:20 -------------------------------------------------------------------------------- Encounter Discharge Information Details Patient Name: Date of Service: [name] 07/02/2019 2:45 PM Medical Record Number: [id number] Patient Account Number: [id number] Date of Birth/Sex: Treating RN: Apr 04, 1945 (74 y.o. Female) Carlene Coria Primary Care Yussuf Sawyers: Royetta Asal, [facility] NDO Other Clinician: Referring Broady Lafoy: Treating Raykwon Hobbs/Extender: Redmond Baseman, [facility] NDO Weeks in Treatment: 0 Encounter Discharge Information Items Post Procedure Vitals Discharge Condition: Stable Temperature (F): 98.2 Ambulatory Status: Wheelchair Pulse (bpm): 84 Discharge Destination: Home Respiratory Rate (breaths/min): 18 Transportation: Private Auto Blood Pressure (mmHg): 117/55 Accompanied By: caregiver Schedule Follow-up Appointment: Yes Clinical Summary of Care: Electronic Signature(s) Signed: 07/06/2019 5:53:12 PM By: Carlene Coria RN Entered By: Carlene Coria on 07/02/2019 16:52:49 -------------------------------------------------------------------------------- Lower Extremity Assessment Details Patient Name: Date of Service: [name], PERKETT 07/02/2019 2:45 PM Medical Record Number: [id number] Patient Account Number: [id number] Date of Birth/Sex: Treating RN: [birth year] (74 y.o. Female) Baruch Gouty Primary Care Victoria Henshaw: Royetta Asal, [facility] NDO Other Clinician: Referring Kenaz Olafson: Treating Clete Kuch/Extender: Redmond Baseman, [facility] NDO Weeks in  Treatment: 0 Edema Assessment Assessed: [Left: No] [Right: No] Edema: [Left: Ye] [Right: s] Calf Left: Right: Point of Measurement: 37 cm From Medial Instep 42 cm cm Ankle Left: Right: Point of Measurement: 10 cm From Medial Instep 26 cm cm Vascular Assessment Pulses: Dorsalis Pedis Palpable: [Left:Yes] Blood Pressure: Brachial: [Left:117] Ankle: [Left:Dorsalis Pedis: 130 1.11] Electronic Signature(s) Signed: 07/02/2019 5:35:25 PM By: Baruch Gouty RN, BSN Signed: 07/06/2019 5:53:12 PM By: Carlene Coria RN Signed: 07/06/2019 5:53:12 PM By: Carlene Coria RN Entered By: Carlene Coria on 07/02/2019 16:50:22 -------------------------------------------------------------------------------- Multi Wound Chart Details Patient Name: Date of Service: RAILYNN, VACCARI 07/02/2019 2:45 PM Medical Record Number: [id number] Patient Account Number: [id number] Date of Birth/Sex: Treating RN: 1945-04-13 (74 y.o. Female) Baruch Gouty Primary Care Angelene Rome: Royetta Asal, [facility] NDO Other Clinician: Referring Taeden Geller: Treating La Dibella/Extender: Redmond Baseman, [facility] NDO Weeks in Treatment: 0 Vital Signs Height(in): 53 Pulse(bpm): 70 Weight(lbs): 183 Blood Pressure(mmHg): 117/55 Body Mass Index(BMI): 30 Temperature(F): 98.2 Respiratory Rate(breaths/min): 18 Photos: [1:No Photos Left Metatarsal head first] [2:No 81 Left T Third oe] [N/A:N/A N/A] Wound Location: [1:Gradually Appeared] [2:Gradually Appeared] [N/A:N/A] Wounding Event: [1:Diabetic Wound/Ulcer of the Lower] [2:Diabetic Wound/Ulcer of the Lower] [N/A:N/A] Primary Etiology: [1:Extremity Congestive Heart Failure,] [2:Extremity Congestive Heart Failure,] [N/A:N/A] Comorbid History: [1:Hypertension, Peripheral Venous Disease, Type II Diabetes, Dementia 07/02/2019] [2:Hypertension, Peripheral Venous Disease, Type II Diabetes, Dementia 07/02/2019] [N/A:N/A] Date Acquired: [1:0] [2:0] [N/A:N/A] Weeks of Treatment: [1:Open] [2:Open]  [N/A:N/A] Wound Status: [1:0.5x0.2x0.3] [2:0.3x0.1x0.1] [N/A:N/A] Measurements L x W x D (cm) [1:0.079] [2:0.024] [N/A:N/A] A (cm) : rea [1:0.024] [2:0.002] [N/A:N/A] Volume (cm) : [1:Grade 1] [2:Grade 1] [N/A:N/A] Classification: [1:None Present] [2:Small] [N/A:N/A] Exudate A mount: [1:N/A] [2:Sanguinous] [N/A:N/A] Exudate Type: [1:N/A] [2:red] [N/A:N/A] Exudate Color: [1:Thickened] [2:Flat and Intact] [N/A:N/A] Wound Margin: [1:Large (67-100%)] [2:Large (67-100%)] [N/A:N/A] Granulation A mount: [1:Red] [2:Red] [N/A:N/A] Granulation Quality: [1:None Present (0%)] [2:None Present (0%)] [N/A:N/A] Necrotic A mount: [1:Fat Layer (Subcutaneous Tissue)] [2:Fat Layer (Subcutaneous Tissue)] [N/A:N/A] Exposed Structures: [1:Exposed: Yes Fascia: No Tendon: No Muscle: No Joint: No Bone: No None] [2:Exposed: Yes Fascia: No Tendon: No Muscle: No Joint: No Bone: No Large (67-100%)] [N/A:N/A] Epithelialization: [1:Debridement - Selective/Open Wound] [2:Debridement - Selective/Open Wound] [N/A:N/A] Debridement: Pre-procedure Verification/Time Out 16:20 [2:16:20] [N/A:N/A] Taken: [1:Callus] [2:Callus] [N/A:N/A] Tissue Debrided: [1:Skin/Epidermis] [2:Skin/Epidermis] [N/A:N/A] Level: [1:1.69] [2:0.36] [  N/A:N/A] Debridement A (sq cm): [1:rea Blade, Curette] [2:Blade, Curette] [N/A:N/A] Instrument: [1:Minimum] [2:Minimum] [N/A:N/A] Bleeding: [1:Pressure] [2:Pressure] [N/A:N/A] Hemostasis A chieved: [1:0] [2:0] [N/A:N/A] Procedural Pain: [1:0] [2:0] [N/A:N/A] Post Procedural Pain: [1:Procedure was tolerated well] [2:Procedure was tolerated well] [N/A:N/A] Debridement Treatment Response: [1:0.5x0.2x0.3] [2:0.3x0.1x0.1] [N/A:N/A] Post Debridement Measurements L x W x D (cm) [1:0.024] [2:0.002] [N/A:N/A] Post Debridement Volume: (cm) [1:Debridement] [2:Debridement] [N/A:N/A] Treatment Notes Electronic Signature(s) Signed: 07/02/2019 4:59:38 PM By: Linton Ham MD Signed: 07/02/2019 5:35:25 PM By:  Baruch Gouty RN, BSN Entered By: Linton Ham on 07/02/2019 16:44:10 -------------------------------------------------------------------------------- Multi-Disciplinary Care Plan Details Patient Name: Date of Service: MILLENIA, CARANO 07/02/2019 2:45 PM Medical Record Number: [id number] Patient Account Number: [id number] Date of Birth/Sex: Treating RN: [birth year] (74 y.o. Female) Baruch Gouty Primary Care Zikeria Keough: Royetta Asal, [facility] NDO Other Clinician: Referring Maleki Hippe: Treating Berry Godsey/Extender: Redmond Baseman, [facility] NDO Weeks in Treatment: 0 Active Inactive Abuse / Safety / Falls / Self Care Management Nursing Diagnoses: Potential for falls Goals: Patient/caregiver [name] verbalize/demonstrate measures taken to prevent injury and/or falls Date Initiated: 07/02/2019 Target Resolution Date: 07/30/2019 Goal Status: Active Interventions: Assess impairment of mobility on admission and as needed per policy Notes: Nutrition Nursing Diagnoses: Impaired glucose control: actual or potential Potential for alteratiion in Nutrition/Potential for imbalanced nutrition Goals: Patient/caregiver [name] maintain therapeutic glucose control Date Initiated: 07/02/2019 Target Resolution Date: 07/30/2019 Goal Status: Active Interventions: Assess HgA1c results as ordered upon admission and as needed Assess patient nutrition upon admission and as needed per policy Provide education on elevated blood sugars and impact on wound healing Treatment Activities: Patient referred to Primary Care Physician for further nutritional evaluation : 07/02/2019 Notes: Wound/Skin Impairment Nursing Diagnoses: Impaired tissue integrity Knowledge deficit related to ulceration/compromised skin integrity Goals: Patient/caregiver [name] verbalize understanding of skin care regimen Date Initiated: 07/02/2019 Target Resolution Date: 07/30/2019 Goal Status: Active Ulcer/skin breakdown [name] have a volume reduction of  30% by week 4 Date Initiated: 07/02/2019 Target Resolution Date: 07/30/2019 Goal Status: Active Interventions: Assess patient/caregiver ability to obtain necessary supplies Assess patient/caregiver ability to perform ulcer/skin care regimen upon admission and as needed Assess ulceration(s) every visit Provide education on ulcer and skin care Treatment Activities: Skin care regimen initiated : 07/02/2019 Topical wound management initiated : 07/02/2019 Notes: Electronic Signature(s) Signed: 07/02/2019 5:35:25 PM By: Baruch Gouty RN, BSN Entered By: Baruch Gouty on 07/02/2019 16:18:20 -------------------------------------------------------------------------------- Pain Assessment Details Patient Name: Date of Service: [name] 07/02/2019 2:45 PM Medical Record Number: [id number] Patient Account Number: [id number] Date of Birth/Sex: Treating RN: [birth year] (74 y.o. Female) Carlene Coria Primary Care Kathie Posa: Royetta Asal, [facility] NDO Other Clinician: Referring Kapono Luhn: Treating Lupe Bonner/Extender: Redmond Baseman, [facility] NDO Weeks in Treatment: 0 Active Problems Location of Pain Severity and Description of Pain Patient Has Paino No Site Locations Pain Management and Medication Current Pain Management: Electronic Signature(s) Signed: 07/06/2019 5:53:12 PM By: Carlene Coria RN Entered By: Carlene Coria on 07/02/2019 16:50:35 -------------------------------------------------------------------------------- Patient/Caregiver Education Details Patient Name: Date of Service: [name] 5/7/2021andnbsp2:45 PM Medical Record Number: [id number] Patient Account Number: [id number] Date of Birth/Gender: Treating RN: [birth year] (74 y.o. Female) Baruch Gouty Primary Care Physician: Royetta Asal, [facility] NDO Other Clinician: Referring Physician: Treating Physician/Extender: Redmond Baseman, [facility] NDO Weeks in Treatment: 0 Education Assessment Education Provided  To: Patient Education Topics Provided Elevated Blood Sugar/ Impact on Healing: Handouts: Elevated Blood Sugars: How Do They Affect Wound Healing Methods: Explain/Verbal, Printed Responses: State content correctly Nutrition: Offloading: Methods: Explain/Verbal Responses: Reinforcements needed, State  content correctly Wound/Skin Impairment: Methods: Explain/Verbal Responses: Reinforcements needed, State content correctly Electronic Signature(s) Signed: 07/02/2019 5:35:25 PM By: Baruch Gouty RN, BSN Entered By: Baruch Gouty on 07/02/2019 16:19:54 -------------------------------------------------------------------------------- Wound Assessment Details Patient Name: Date of Service: AREEJ, FLOOK 07/02/2019 2:45 PM Medical Record Number: [id number] Patient Account Number: [id number] Date of Birth/Sex: Treating RN: 20-Dec-1945 (74 y.o. Female) Baruch Gouty Primary Care Bennie Scaff: Royetta Asal, [facility] NDO Other Clinician: Referring Evelia Waskey: Treating Taron Mondor/Extender: Redmond Baseman, [facility] NDO Weeks in Treatment: 0 Wound Status Wound Number: 1 Primary Diabetic Wound/Ulcer of the Lower Extremity Etiology: Wound Location: Left Metatarsal head first Wound Open Wounding Event: Gradually Appeared Status: Date Acquired: 07/02/2019 Comorbid Congestive Heart Failure, Hypertension, Peripheral Venous Weeks Of Treatment: 0 History: Disease, Type II Diabetes, Dementia Clustered Wound: No Photos Photo Uploaded By: Mikeal Hawthorne on 07/06/2019 08:22:48 Wound Measurements Length: (cm) 0.5 Width: (cm) 0.2 Depth: (cm) 0.3 Area: (cm) 0.079 Volume: (cm) 0.024 % Reduction in Area: % Reduction in Volume: Epithelialization: None Tunneling: No Undermining: No Wound Description Classification: Grade 1 Wound Margin: Thickened Exudate Amount: None Present Foul Odor After Cleansing: No Slough/Fibrino No Wound Bed Granulation Amount: Large (67-100%) Exposed Structure Granulation  Quality: Red Fascia Exposed: No Necrotic Amount: None Present (0%) Fat Layer (Subcutaneous Tissue) Exposed: Yes Tendon Exposed: No Muscle Exposed: No Joint Exposed: No Bone Exposed: No Treatment Notes Wound #1 (Left Metatarsal head first) 1. Cleanse With Wound Cleanser 3. Primary Dressing Applied Calcium Alginate Ag 4. Secondary Dressing Dry Gauze Foam 5. Secured With Recruitment consultant) Signed: 07/02/2019 5:35:25 PM By: Baruch Gouty RN, BSN Entered By: Baruch Gouty on 07/02/2019 16:25:31 -------------------------------------------------------------------------------- Wound Assessment Details Patient Name: Date of Service: [name], [name] 07/02/2019 2:45 PM Medical Record Number: [id number] Patient Account Number: [id number] Date of Birth/Sex: Treating RN: 1945-06-24 (74 y.o. Female) Baruch Gouty Primary Care Manaal Mandala: Royetta Asal, [facility] NDO Other Clinician: Referring Emmeline Winebarger: Treating Amaad Byers/Extender: Redmond Baseman, FERNA NDO Weeks in Treatment: 0 Wound Status Wound Number: 2 Primary Diabetic Wound/Ulcer of the Lower Extremity Etiology: Wound Location: Left T Third oe Wound Open Wounding Event: Gradually Appeared Status: Date Acquired: 07/02/2019 Comorbid Congestive Heart Failure, Hypertension, Peripheral Venous Weeks Of Treatment: 0 History: Disease, Type II Diabetes, Dementia Clustered Wound: No Photos Photo Uploaded By: Mikeal Hawthorne on 07/06/2019 08:22:49 Wound Measurements Length: (cm) 0.3 Width: (cm) 0.1 Depth: (cm) 0.1 Area: (cm) 0.024 Volume: (cm) 0.002 % Reduction in Area: % Reduction in Volume: Epithelialization: Large (67-100%) Tunneling: No Undermining: No Wound Description Classification: Grade 1 Wound Margin: Flat and Intact Exudate Amount: Small Exudate Type: Sanguinous Exudate Color: red Foul Odor After Cleansing: No Slough/Fibrino No Wound Bed Granulation Amount: Large (67-100%) Exposed  Structure Granulation Quality: Red Fascia Exposed: No Necrotic Amount: None Present (0%) Fat Layer (Subcutaneous Tissue) Exposed: Yes Tendon Exposed: No Muscle Exposed: No Joint Exposed: No Bone Exposed: No Treatment Notes Wound #2 (Left Toe Third) 1. Cleanse With Wound Cleanser 3. Primary Dressing Applied Calcium Alginate Ag 4. Secondary Dressing Dry Gauze Foam 5. Secured With Recruitment consultant) Signed: 07/02/2019 5:35:25 PM By: Baruch Gouty RN, BSN Entered By: Baruch Gouty on 07/02/2019 16:26:32 -------------------------------------------------------------------------------- [place] Details Patient Name: Date of Service: [name] 07/02/2019 2:45 PM Medical Record Number: [id number] Patient Account Number: [id number] Date of Birth/Sex: Treating RN: 15-Feb-1946 (74 y.o. Female) Carlene Coria Primary Care Trong Gosling: Royetta Asal, [facility] NDO Other Clinician: Referring Deniese Oberry: Treating Bula Cavalieri/Extender: Redmond Baseman, FERNA NDO Weeks in Treatment: 0 Vital Signs Time Taken: 15:33  Temperature (F): 98.2 Height (in): 65 Pulse (bpm): 84 Source: Stated Respiratory Rate (breaths/min): 18 Weight (lbs): 183 Blood Pressure (mmHg): 117/55 Body Mass Index (BMI): 30.4 Reference Range: 80 - 120 mg / dl Electronic Signature(s) Signed: 07/06/2019 5:53:12 PM By: Carlene Coria RN Entered By: Carlene Coria on 07/02/2019 15:34:23

## 2019-07-06 NOTE — Progress Notes (Signed)
[name], [name] ([id number]) Visit Report for 07/02/2019 Abuse/Suicide Risk Screen Details Patient Name: Date of Service: [name], [name] 07/02/2019 2:45 PM Medical Record Number: [id number] Patient Account Number: [id number] Date of Birth/Sex: Treating RN: 12-15-1945 (74 y.o. Female) Carlene Coria Primary Care Rawn Quiroa: Royetta Asal, [facility]NA NDO Other Clinician: Referring Trevonne Nyland: Treating Zana Biancardi/Extender: Redmond Baseman, FERNA NDO Weeks in Treatment: 0 Abuse/Suicide Risk Screen Items Answer ABUSE RISK SCREEN: Has anyone close to you tried to hurt or harm you recentlyo No Do you feel uncomfortable with anyone in your familyo No Has anyone forced you do things that you didnt want to doo No Electronic Signature(s) Signed: 07/06/2019 5:53:12 PM By: Carlene Coria RN Entered By: Carlene Coria on 07/02/2019 15:39:04 -------------------------------------------------------------------------------- Activities of Daily Living Details Patient Name: Date of Service: [name], [name] 07/02/2019 2:45 PM Medical Record Number: [id number] Patient Account Number: [id number] Date of Birth/Sex: Treating RN: Feb 27, 1945 (74 y.o. Female) Carlene Coria Primary Care Callie Bunyard: Royetta Asal, FERNA NDO Other Clinician: Referring Angelgabriel Willmore: Treating Tyra Gural/Extender: Redmond Baseman, FERNA NDO Weeks in Treatment: 0 Activities of Daily Living Items Answer Activities of Daily Living (Please select one for each item) Drive Automobile Not Able T Medications ake Not Able Use T elephone Not Able Care for Appearance Not Able Use T oilet Not Able Bath / Shower Not Able Dress Self Not Able Feed Self Completely Able Walk Not Able Get In / Out Bed Not [place] for Self Not Able Electronic Signature(s) Signed: 07/06/2019 5:53:12 PM By: Carlene Coria RN Entered By: Carlene Coria on 07/02/2019  15:39:53 -------------------------------------------------------------------------------- Education Screening Details Patient Name: Date of Service: [name] 07/02/2019 2:45 PM Medical Record Number: [id number] Patient Account Number: [id number] Date of Birth/Sex: Treating RN: 04/17/1945 (74 y.o. Female) Carlene Coria Primary Care Jasen Hartstein: Royetta Asal, [facility]NA NDO Other Clinician: Referring Makar Slatter: Treating Kylie Gros/Extender: Redmond Baseman, FERNA NDO Weeks in Treatment: 0 Primary Learner Assessed: Caregiver Reason Patient is not Primary Learner: cognitive ability Learning Preferences/Education Level/Primary Language Learning Preference: Explanation Highest Education Level: College or Above Preferred Language: English Cognitive Barrier Language Barrier: No Translator Needed: No Memory Deficit: No Emotional Barrier: No Cultural/Religious Beliefs Affecting Medical Care: No Physical Barrier Impaired Vision: No Impaired Hearing: No Decreased Hand dexterity: No Knowledge/Comprehension Knowledge Level: Medium Comprehension Level: High Ability to understand written instructions: High Ability to understand verbal instructions: High Motivation Anxiety Level: Calm Cooperation: Cooperative Education Importance: Acknowledges Need Interest in Health Problems: Asks Questions Perception: Coherent Willingness to Engage in Self-Management High Activities: Readiness to Engage in Self-Management High Activities: Electronic Signature(s) Signed: 07/06/2019 5:53:12 PM By: Carlene Coria RN Entered By: Carlene Coria on 07/02/2019 15:40:32 -------------------------------------------------------------------------------- Fall Risk Assessment Details Patient Name: Date of Service: [name] 07/02/2019 2:45 PM Medical Record Number: [id number] Patient Account Number: [id number] Date of Birth/Sex: Treating RN: [birth year] (74 y.o. Female) Epps, [place] Primary Care Naveena Eyman: Royetta Asal,  FERNA NDO Other Clinician: Referring Derica Leiber: Treating Vidhi Delellis/Extender: Redmond Baseman, FERNA NDO Weeks in Treatment: 0 Fall Risk Assessment Items Have you had 2 or more falls in the last 12 monthso 0 Yes Have you had any fall that resulted in injury in the last 12 monthso 0 Yes FALLS RISK SCREEN History of falling - immediate or within 3 months 25 Yes Secondary diagnosis (Do you have 2 or more medical diagnoseso) 0 No Ambulatory aid None/bed rest/wheelchair/nurse 0 No Crutches/cane/walker 0 No Furniture 0 No Intravenous therapy  Access/Saline/Heparin Lock 0 No Gait/Transferring Normal/ bed rest/ wheelchair 0 No Weak (short steps with or without shuffle, stooped but able to lift head while walking, may seek 0 No support from furniture) Impaired (short steps with shuffle, may have difficulty arising from chair, head down, impaired 0 No balance) Mental Status Oriented to own ability 0 No Electronic Signature(s) Signed: 07/06/2019 5:53:12 PM By: Carlene Coria RN Entered By: Carlene Coria on 07/02/2019 15:40:45 -------------------------------------------------------------------------------- Foot Assessment Details Patient Name: Date of Service: [name], [name] 07/02/2019 2:45 PM Medical Record Number: [id number] Patient Account Number: [id number] Date of Birth/Sex: Treating RN: 1945-07-05 (73 y.o. Female) Epps, [place] Primary Care Jonuel Butterfield: Royetta Asal, FERNA NDO Other Clinician: Referring Rosaura Bolon: Treating Alyona Romack/Extender: Redmond Baseman, FERNA NDO Weeks in Treatment: 0 Foot Assessment Items [x]  Unable to perform due to altered mental status Site Locations + = Sensation present, - = Sensation absent, C = Callus, U = Ulcer R = Redness, W = Warmth, M = Maceration, PU = Pre-ulcerative lesion F = Fissure, S = Swelling, D = Dryness Assessment Right: Left: Other Deformity: No No Prior Foot Ulcer: No No Prior Amputation: No No Charcot Joint: No No Ambulatory  Status: Non-ambulatory Assistance Device: Wheelchair Gait: Administrator, arts) Signed: 07/06/2019 5:53:12 PM By: Carlene Coria RN Entered By: Carlene Coria on 07/02/2019 15:41:12 -------------------------------------------------------------------------------- Nutrition Risk Screening Details Patient Name: Date of Service: [name], [name] 07/02/2019 2:45 PM Medical Record Number: [id number] Patient Account Number: [id number] Date of Birth/Sex: Treating RN: Jul 18, 1945 (74 y.o. Female) Epps, [place] Primary Care Coleta Grosshans: Havery Moros NDO Other Clinician: Referring Martese Vanatta: Treating Narcissa Melder/Extender: Redmond Baseman, FERNA NDO Weeks in Treatment: 0 Height (in): 65 Weight (lbs): 183 Body Mass Index (BMI): 30.4 Nutrition Risk Screening Items Score Screening NUTRITION RISK SCREEN: I have an illness or condition that made me change the kind and/or amount of food I eat 0 No I eat fewer than two meals per day 0 No I eat few fruits and vegetables, or milk products 0 No I have three or more drinks of beer, liquor or wine almost every day 0 No I have tooth or mouth problems that make it hard for me to eat 0 No I don't always have enough money to buy the food I need 0 No I eat alone most of the time 0 No I take three or more different prescribed or over-the-counter drugs a day 1 Yes Without wanting to, I have lost or gained 10 pounds in the last six months 0 No I am not always physically able to shop, cook and/or feed myself 2 Yes Nutrition Protocols Good Risk Protocol Moderate Risk Protocol 0 Provide education on nutrition High Risk Proctocol Risk Level: Moderate Risk Score: 3 Electronic Signature(s) Signed: 07/06/2019 5:53:12 PM By: Carlene Coria RN Entered By: Carlene Coria on 07/02/2019 15:40:55

## 2019-07-13 ENCOUNTER — Encounter (HOSPITAL_BASED_OUTPATIENT_CLINIC_OR_DEPARTMENT_OTHER): Payer: Medicare Other | Admitting: Internal Medicine

## 2019-07-13 DIAGNOSIS — E11621 Type 2 diabetes mellitus with foot ulcer: Secondary | ICD-10-CM | POA: Diagnosis not present

## 2019-07-15 ENCOUNTER — Encounter (HOSPITAL_BASED_OUTPATIENT_CLINIC_OR_DEPARTMENT_OTHER): Payer: Medicare Other | Admitting: Internal Medicine

## 2019-07-15 NOTE — Progress Notes (Signed)
Laura, [name] ([id number]) Visit Report for 07/13/2019 HPI Details Patient Name: Date of Service: [name], [name] 07/13/2019 1:30 PM Medical Record Number: [id number] Patient Account Number: [id number] Date of Birth/Sex: Treating RN: February 21, 1946 (74 y.o. Orvan Falconer Primary Care Provider: Royetta Asal, [facility] NDO Other Clinician: Referring Provider: Treating Provider/Extender: Redmond Baseman, FERNA NDO Weeks in Treatment: 1 History of Present Illness HPI Description: ADMISSION 07/02/2019. This is a 74 year old woman who was currently living in the locked unit at [facility]. She is listed as having Alzheimer's disease with behavioral disturbances perhaps some parkinsonism. She has a history of falling and probably wandering. She has been wearing gripping socks on both feet, not clear that she is actually wearing footwear. The facility has been dealing with thickly callused areas on the left first plantar met head, left fifth plantar met head, left plantar third toe tip and an area on the right lateral foot. In terms of the major area on the left first met head this is been draining and bleeding apparently she has been on doxycycline. Apparently the in-house podiatrist will not debrided anything that has an open wound. This would be difficult to understand The patient has Alzheimer's disease, type 2 diabetes, she has a history of a left foot ulcer in March 2020, congestive heart failure, parkinsonism and peripheral vascular disease ABI on the left was 1.11 5/18; patient with a wound on the tip of her left third toe and left first met head.. Electronic Signature(s) Signed: 07/15/2019 12:52:55 PM By: Linton Ham MD Entered By: Linton Ham on 07/13/2019 16:03:07 -------------------------------------------------------------------------------- Physical Exam Details Patient Name: Date of Service: Laura, [name] 07/13/2019 1:30 PM Medical Record Number:  [id number] Patient Account Number: [id number] Date of Birth/Sex: Treating RN: [birth year] (74 y.o. Orvan Falconer Primary Care Provider: Royetta Asal, [facility] NDO Other Clinician: Referring Provider: Treating Provider/Extender: Redmond Baseman, FERNA NDO Weeks in Treatment: 1 Constitutional Patient is hypertensive.. Pulse regular and within target range for patient.Marland Kitchen Respirations regular, non-labored and within target range.. Temperature is normal and within the target range for the patient.Marland Kitchen Appears in no distress. Cardiovascular Pedal pulses are palpable on the left. Notes Wound exam; first met head and a raised area on the plantar tip of the third toe. The left third toe is just about closed the first met head is still open. Nothing required debridement. Electronic Signature(s) Signed: 07/15/2019 12:52:55 PM By: Linton Ham MD Entered By: Linton Ham on 07/13/2019 16:04:15 -------------------------------------------------------------------------------- Physician Orders Details Patient Name: Date of Service: [name], [name] 07/13/2019 1:30 PM Medical Record Number: [id number] Patient Account Number: [id number] Date of Birth/Sex: Treating RN: 05-Aug-1945 (74 y.o. Orvan Falconer Primary Care Provider: Royetta Asal, [facility] NDO Other Clinician: Referring Provider: Treating Provider/Extender: Redmond Baseman, FERNA NDO Weeks in Treatment: 1 Verbal / Phone Orders: No Diagnosis Coding ICD-10 Coding Code Description E11.621 Type 2 diabetes mellitus with foot ulcer L97.522 Non-pressure chronic ulcer of other part of left foot with fat layer exposed F01.51 Vascular dementia with behavioral disturbance Follow-up Appointments Return Appointment in 2 weeks. Dressing Change Frequency Wound #1 Left Metatarsal head first Change Dressing every other day. Wound #2 Left T Third oe Change Dressing every other day. Wound Cleansing Wound #1 Left Metatarsal head first Clean wound with  Wound Cleanser Wound #2 Left T Third oe Clean wound with Wound Cleanser Primary Wound Dressing Wound #1 Left Metatarsal head first Calcium Alginate with Silver Foam - foam donut Wound #2 Left  T Third oe Calcium Alginate with Silver Foam - foam donut Secondary Dressing Wound #1 Left Metatarsal head first Kerlix/Rolled Gauze Dry Gauze Wound #2 Left T Third oe Kerlix/Rolled Gauze Dry Gauze Off-Loading Other: - pt to wear shoes, not slippers to ambulate Electronic Signature(s) Signed: 07/13/2019 5:13:38 PM By: Carlene Coria RN Signed: 07/15/2019 12:52:55 PM By: Linton Ham MD Entered By: Carlene Coria on 07/13/2019 14:42:36 -------------------------------------------------------------------------------- Problem List Details Patient Name: Date of Service: [name] 07/13/2019 1:30 PM Medical Record Number: [id number] Patient Account Number: [id number] Date of Birth/Sex: Treating RN: 1945/07/28 (74 y.o. Orvan Falconer Primary Care Provider: Royetta Asal, [facility] NDO Other Clinician: Referring Provider: Treating Provider/Extender: Redmond Baseman, FERNA NDO Weeks in Treatment: 1 Active Problems ICD-10 Encounter Code Description Active Date MDM Diagnosis E11.621 Type 2 diabetes mellitus with foot ulcer 07/02/2019 No Yes L97.522 Non-pressure chronic ulcer of other part of left foot with fat layer exposed 07/02/2019 No Yes F01.51 Vascular dementia with behavioral disturbance 07/02/2019 No Yes Inactive Problems Resolved Problems Electronic Signature(s) Signed: 07/15/2019 12:52:55 PM By: Linton Ham MD Entered By: Linton Ham on 07/13/2019 16:02:12 -------------------------------------------------------------------------------- Progress Note Details Patient Name: Date of Service: [name] 07/13/2019 1:30 PM Medical Record Number: [id number] Patient Account Number: [id number] Date of Birth/Sex: Treating RN: [birth year] (74 y.o. Orvan Falconer Primary Care  Provider: Royetta Asal, [facility] NDO Other Clinician: Referring Provider: Treating Provider/Extender: Redmond Baseman, FERNA NDO Weeks in Treatment: 1 Subjective History of Present Illness (HPI) ADMISSION 07/02/2019. This is a 74 year old woman who was currently living in the locked unit at [facility] skilled [facility]. She is listed as having Alzheimer's disease with behavioral disturbances perhaps some parkinsonism. She has a history of falling and probably wandering. She has been wearing gripping socks on both feet, not clear that she is actually wearing footwear. The facility has been dealing with thickly callused areas on the left first plantar met head, left fifth plantar met head, left plantar third toe tip and an area on the right lateral foot. In terms of the major area on the left first met head this is been draining and bleeding apparently she has been on doxycycline. Apparently the in-house podiatrist will not debrided anything that has an open wound. This would be difficult to understand The patient has Alzheimer's disease, type 2 diabetes, she has a history of a left foot ulcer in March 2020, congestive heart failure, parkinsonism and peripheral vascular disease ABI on the left was 1.11 5/18; patient with a wound on the tip of her left third toe and left first met head.. Objective Constitutional Patient is hypertensive.. Pulse regular and within target range for patient.Marland Kitchen Respirations regular, non-labored and within target range.. Temperature is normal and within the target range for the patient.Marland Kitchen Appears in no distress. Vitals Time Taken: 1:19 PM, Height: 65 in, Source: Stated, Weight: 183 lbs, Source: Stated, BMI: 30.4, Temperature: 98.4 F, Pulse: 75 bpm, Respiratory Rate: 18 breaths/min, Blood Pressure: 153/66 mmHg. Cardiovascular Pedal pulses are palpable on the left. General Notes: Wound exam; first met head and a raised area on the plantar tip of the third toe. The left  third toe is just about closed the first met head is still open. Nothing required debridement. Integumentary (Hair, Skin) Wound #1 status is Open. Original cause of wound was Gradually Appeared. The wound is located on the Left Metatarsal head first. The wound measures 0.3cm length x 0.1cm width x 0.4cm depth; 0.024cm^2 area and 0.009cm^3 volume. There  is Fat Layer (Subcutaneous Tissue) Exposed exposed. There is no tunneling noted, however, there is undermining starting at 12:00 and ending at 12:00 with a maximum distance of 0.8cm. There is a small amount of purulent drainage noted. The wound margin is thickened. There is large (67-100%) red granulation within the wound bed. There is no necrotic tissue within the wound bed. Wound #2 status is Open. Original cause of wound was Gradually Appeared. The wound is located on the Left T Third. The wound measures 0cm length x oe 0cm width x 0cm depth; 0cm^2 area and 0cm^3 volume. There is no tunneling or undermining noted. There is a none present amount of drainage noted. The wound margin is flat and intact. There is no granulation within the wound bed. There is no necrotic tissue within the wound bed. Assessment Active Problems ICD-10 Type 2 diabetes mellitus with foot ulcer Non-pressure chronic ulcer of other part of left foot with fat layer exposed Vascular dementia with behavioral disturbance Plan Follow-up Appointments: Return Appointment in 2 weeks. Dressing Change Frequency: Wound #1 Left Metatarsal head first: Change Dressing every other day. Wound #2 Left T Third: oe Change Dressing every other day. Wound Cleansing: Wound #1 Left Metatarsal head first: Clean wound with Wound Cleanser Wound #2 Left T Third: oe Clean wound with Wound Cleanser Primary Wound Dressing: Wound #1 Left Metatarsal head first: Calcium Alginate with Silver Foam - foam donut Wound #2 Left T Third: oe Calcium Alginate with Silver Foam - foam  donut Secondary Dressing: Wound #1 Left Metatarsal head first: Kerlix/Rolled Gauze Dry Gauze Wound #2 Left T Third: oe Kerlix/Rolled Gauze Dry Gauze Off-Loading: Other: - pt to wear shoes, not slippers to ambulate 1. Continue with silver alginate to both wound areas 2. Neither one of them appears infected 3. The patient wanders in the dementia unit of the facility where she lives. I was not expecting this to look so good today. I asked him to make sure she wear shoes as far as I know they have done this. 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 Orvan Falconer Primary Care Provider: Royetta Asal, [facility] NDO Other Clinician: Referring Provider: Treating Provider/Extender: Redmond Baseman, FERNA NDO Weeks in Treatment: 1 Diagnosis Coding ICD-10 Codes Code Description E11.621 Type 2 diabetes mellitus with foot ulcer L97.522 Non-pressure chronic ulcer of other part of left foot with fat layer exposed F01.51 Vascular dementia with behavioral disturbance Facility Procedures CPT4 Code: [id number] Description: 99213 - WOUND CARE VISIT-LEV 3 EST PT Modifier: Quantity: 1 Physician Procedures : CPT4 Code Description Modifier [id number] - WC PHYS LEVEL 3 - EST PT ICD-10 Diagnosis Description E11.621 Type 2 diabetes mellitus with foot ulcer L97.522 Non-pressure chronic ulcer of other part of left foot with fat layer exposed F01.51 Vascular  dementia with behavioral disturbance Quantity: 1 Electronic Signature(s) Signed: 07/15/2019 12:52:55 PM By: Linton Ham MD Entered By: Linton Ham on 07/13/2019 16:05:28

## 2019-07-15 NOTE — Progress Notes (Signed)
YARITZI, HAMMER ([id number]) Visit Report for 07/13/2019 Arrival Information Details Patient Name: Date of Service: LAJUNE, BROCKBANK 07/13/2019 1:30 PM Medical Record Number: [id number] Patient Account Number: [id number] Date of Birth/Sex: Treating RN: Nov 09, 1945 (74 y.o. Elam Dutch Primary Care Kemper Heupel: Royetta Asal, [facility] NDO Other Clinician: Referring Yanira Tolsma: Treating Georgean Spainhower/Extender: Redmond Baseman, [facility] NDO Weeks in Treatment: 1 Visit Information History Since Last Visit Added or deleted any medications: No Patient Arrived: Wheel Chair Any new allergies or adverse reactions: No Arrival Time: 13:13 Had a fall or experienced change in Yes Accompanied By: facility staff activities of daily living that may affect Transfer Assistance: None risk of falls: Patient Requires Transmission-Based Precautions: No Signs or symptoms of abuse/neglect since last visito No Patient Has Alerts: No Hospitalized since last visit: No Implantable device outside of the clinic excluding No cellular tissue based products placed in the center since last visit: Has Dressing in Place as Prescribed: Yes Pain Present Now: No Electronic Signature(s) Signed: 07/13/2019 5:27:28 PM By: Baruch Gouty RN, BSN Entered By: Baruch Gouty on 07/13/2019 13:19:00 -------------------------------------------------------------------------------- Clinic Level of Care Assessment Details Patient Name: Date of Service: MASAKO, PICCININI 07/13/2019 1:30 PM Medical Record Number: [id number] Patient Account Number: [id number] Date of Birth/Sex: Treating RN: Aug 27, 1945 (74 y.o. Orvan Falconer Primary Care Merlyn Bollen: Royetta Asal, [facility] NDO Other Clinician: Referring Kielyn Kardell: Treating Shep Porter/Extender: Redmond Baseman, [facility] NDO Weeks in Treatment: 1 Clinic Level of Care Assessment Items TOOL 4 Quantity Score X- 1 0 Use when only an EandM is performed on FOLLOW-UP visit ASSESSMENTS - Nursing Assessment /  Reassessment X- 1 10 Reassessment of Co-morbidities (includes updates in patient status) X- 1 5 Reassessment of Adherence to Treatment Plan ASSESSMENTS - Wound and Skin A ssessment / Reassessment []  - 0 Simple Wound Assessment / Reassessment - one wound X- 2 5 Complex Wound Assessment / Reassessment - multiple wounds []  - 0 Dermatologic / Skin Assessment (not related to wound area) ASSESSMENTS - Focused Assessment []  - 0 Circumferential Edema Measurements - multi extremities []  - 0 Nutritional Assessment / Counseling / Intervention []  - 0 Lower Extremity Assessment (monofilament, tuning fork, pulses) []  - 0 Peripheral Arterial Disease Assessment (using hand held doppler) ASSESSMENTS - Ostomy and/or Continence Assessment and Care []  - 0 Incontinence Assessment and Management []  - 0 Ostomy Care Assessment and Management (repouching, etc.) PROCESS - Coordination of Care []  - 0 Simple Patient / Family Education for ongoing care X- 1 20 Complex (extensive) Patient / Family Education for ongoing care X- 1 10 Staff obtains Programmer, systems, Records, T Results / Process Orders est []  - 0 Staff telephones HHA, Nursing Homes / Clarify orders / etc []  - 0 Routine Transfer to another Facility (non-emergent condition) []  - 0 Routine Hospital Admission (non-emergent condition) []  - 0 New Admissions / Biomedical engineer / Ordering NPWT Apligraf, etc. , []  - 0 Emergency Hospital Admission (emergent condition) X- 1 10 Simple Discharge Coordination []  - 0 Complex (extensive) Discharge Coordination PROCESS - Special Needs []  - 0 Pediatric / Minor Patient Management []  - 0 Isolation Patient Management []  - 0 Hearing / Language / Visual special needs []  - 0 Assessment of Community assistance (transportation, D/C planning, etc.) []  - 0 Additional assistance / Altered mentation []  - 0 Support Surface(s) Assessment (bed, cushion, seat, etc.) INTERVENTIONS - Wound Cleansing /  Measurement []  - 0 Simple Wound Cleansing - one wound X- 2 5 Complex Wound Cleansing - multiple wounds X- 1 5 Wound  Imaging (photographs - any number of wounds) []  - 0 Wound Tracing (instead of photographs) []  - 0 Simple Wound Measurement - one wound X- 2 5 Complex Wound Measurement - multiple wounds INTERVENTIONS - Wound Dressings []  - 0 Small Wound Dressing one or multiple wounds X- 1 15 Medium Wound Dressing one or multiple wounds []  - 0 Large Wound Dressing one or multiple wounds []  - 0 Application of Medications - topical []  - 0 Application of Medications - injection INTERVENTIONS - Miscellaneous []  - 0 External ear exam []  - 0 Specimen Collection (cultures, biopsies, blood, body fluids, etc.) []  - 0 Specimen(s) / Culture(s) sent or taken to Lab for analysis []  - 0 Patient Transfer (multiple staff / Civil Service fast streamer / Similar devices) []  - 0 Simple Staple / Suture removal (25 or less) []  - 0 Complex Staple / Suture removal (26 or more) []  - 0 Hypo / Hyperglycemic Management (close monitor of Blood Glucose) []  - 0 Ankle / Brachial Index (ABI) - do not check if billed separately X- 1 5 Vital Signs Has the patient been seen at the hospital within the last three years: Yes Total Score: 110 Level Of Care: New/Established - Level 3 Electronic Signature(s) Signed: 07/13/2019 5:13:38 PM By: Carlene Coria RN Entered By: Carlene Coria on 07/13/2019 14:46:11 -------------------------------------------------------------------------------- Encounter Discharge Information Details Patient Name: Date of Service: [name] 07/13/2019 1:30 PM Medical Record Number: [id number] Patient Account Number: [id number] Date of Birth/Sex: Treating RN: 1945-05-27 (74 y.o. Clearnce Sorrel Primary Care Troy Kanouse: Royetta Asal, [facility] NDO Other Clinician: Referring Sheyli Horwitz: Treating Van Ehlert/Extender: Redmond Baseman, [facility] NDO Weeks in Treatment: 1 Encounter Discharge Information  Items Discharge Condition: Stable Ambulatory Status: Wheelchair Discharge Destination: [place] Telephoned: No Orders Sent: Yes Transportation: Other Accompanied By: caregiver Schedule Follow-up Appointment: Yes Clinical Summary of Care: Patient Declined Electronic Signature(s) Signed: 07/13/2019 5:47:21 PM By: Kela Millin Entered By: Kela Millin on 07/13/2019 14:58:52 -------------------------------------------------------------------------------- Lower Extremity Assessment Details Patient Name: Date of Service: PAYAL, CHIHUAHUA 07/13/2019 1:30 PM Medical Record Number: [id number] Patient Account Number: [id number] Date of Birth/Sex: Treating RN: 01-31-1946 (74 y.o. Elam Dutch Primary Care Tristian Bouska: Royetta Asal, [facility] NDO Other Clinician: Referring Juanita Devincent: Treating Sevannah Madia/Extender: Redmond Baseman, [facility] NDO Weeks in Treatment: 1 Edema Assessment Assessed: [Left: No] [Right: No] Edema: [Left: Ye] [Right: s] Calf Left: Right: Point of Measurement: 37 cm From Medial Instep 42 cm cm Ankle Left: Right: Point of Measurement: 10 cm From Medial Instep 24.5 cm cm Vascular Assessment Pulses: Dorsalis Pedis Palpable: [Left:Yes] Electronic Signature(s) Signed: 07/13/2019 5:27:28 PM By: Baruch Gouty RN, BSN Entered By: Baruch Gouty on 07/13/2019 13:26:39 -------------------------------------------------------------------------------- Multi Wound Chart Details Patient Name: Date of Service: [name] 07/13/2019 1:30 PM Medical Record Number: [id number] Patient Account Number: [id number] Date of Birth/Sex: Treating RN: 09-22-1945 (74 y.o. Orvan Falconer Primary Care Jonetta Dagley: Royetta Asal, [facility] NDO Other Clinician: Referring Loa Idler: Treating Bobbyjo Marulanda/Extender: Redmond Baseman, [facility] NDO Weeks in Treatment: 1 Vital Signs Height(in): 25 Pulse(bpm): 79 Weight(lbs): 183 Blood Pressure(mmHg): 153/66 Body Mass Index(BMI):  30 Temperature(F): 98.4 Respiratory Rate(breaths/min): 18 Photos: [1:No Photos Left Metatarsal head first] [2:No 39 Left T Third oe] [N/A:N/A N/A] Wound Location: [1:Gradually Appeared] [2:Gradually Appeared] [N/A:N/A] Wounding Event: [1:Diabetic Wound/Ulcer of the Lower] [2:Diabetic Wound/Ulcer of the Lower] [N/A:N/A] Primary Etiology: [1:Extremity Congestive Heart Failure,] [2:Extremity Congestive Heart Failure,] [N/A:N/A] Comorbid History: [1:Hypertension, Peripheral Venous Disease, Type II Diabetes, Dementia 07/02/2019] [2:Hypertension, Peripheral Venous Disease, Type II Diabetes,  Dementia 07/02/2019] [N/A:N/A] Date Acquired: [1:1] [2:1] [N/A:N/A] Weeks of Treatment: [1:Open] [2:Open] [N/A:N/A] Wound Status: [1:0.3x0.1x0.4] [2:0x0x0] [N/A:N/A] Measurements L x W x D (cm) [1:0.024] [2:0] [N/A:N/A] A (cm) : rea [1:0.009] [2:0] [N/A:N/A] Volume (cm) : [1:69.60%] [2:100.00%] [N/A:N/A] % Reduction in A rea: [1:62.50%] [2:100.00%] [N/A:N/A] % Reduction in Volume: [1:12] Starting Position 1 (o'clock): [1:12] Ending Position 1 (o'clock): [1:0.8] Maximum Distance 1 (cm): [1:Yes] [2:No] [N/A:N/A] Undermining: [1:Grade 1] [2:Grade 1] [N/A:N/A] Classification: [1:Small] [2:None Present] [N/A:N/A] Exudate A mount: [1:Purulent] [2:N/A] [N/A:N/A] Exudate Type: [1:yellow, brown, green] [2:N/A] [N/A:N/A] Exudate Color: [1:Thickened] [2:Flat and Intact] [N/A:N/A] Wound Margin: [1:Large (67-100%)] [2:None Present (0%)] [N/A:N/A] Granulation A mount: [1:Red] [2:N/A] [N/A:N/A] Granulation Quality: [1:None Present (0%)] [2:None Present (0%)] [N/A:N/A] Necrotic A mount: [1:Fat Layer (Subcutaneous Tissue)] [2:Fascia: No] [N/A:N/A] Exposed Structures: [1:Exposed: Yes Fascia: No Tendon: No Muscle: No Joint: No Bone: No None] [2:Fat Layer (Subcutaneous Tissue) Exposed: No Tendon: No Muscle: No Joint: No Bone: No Large (67-100%)] [N/A:N/A] Treatment Notes Wound #1 (Left Metatarsal head first) 1.  Cleanse With Wound Cleanser 2. Periwound Care Skin Prep 3. Primary Dressing Applied Calcium Alginate Ag 4. Secondary Dressing Dry Gauze Roll Gauze Foam 5. Secured With Tape Notes netting. foam donut Wound #2 (Left Toe Third) 1. Cleanse With Wound Cleanser 2. Periwound Care Skin Prep 3. Primary Dressing Applied Calcium Alginate Ag 4. Secondary Dressing Dry Gauze Roll Gauze Foam 5. Secured With Tape Notes netting. foam donut Electronic Signature(s) Signed: 07/13/2019 5:13:38 PM By: Carlene Coria RN Signed: 07/15/2019 12:52:55 PM By: Linton Ham MD Entered By: Linton Ham on 07/13/2019 16:02:21 -------------------------------------------------------------------------------- Multi-Disciplinary Care Plan Details Patient Name: Date of Service: DAMITRA, SANTRY 07/13/2019 1:30 PM Medical Record Number: [id number] Patient Account Number: [id number] Date of Birth/Sex: Treating RN: [birth year] (74 y.o. Orvan Falconer Primary Care Hurschel Paynter: Royetta Asal, [facility] NDO Other Clinician: Referring Alann Avey: Treating Bernell Haynie/Extender: Redmond Baseman, [facility] NDO Weeks in Treatment: 1 Active Inactive Abuse / Safety / Falls / Self Care Management Nursing Diagnoses: Potential for falls Goals: Patient/caregiver will verbalize/demonstrate measures taken to prevent injury and/or falls Date Initiated: 07/02/2019 Target Resolution Date: 07/30/2019 Goal Status: Active Interventions: Assess impairment of mobility on admission and as needed per policy Notes: Nutrition Nursing Diagnoses: Impaired glucose control: actual or potential Potential for alteratiion in Nutrition/Potential for imbalanced nutrition Goals: Patient/caregiver will maintain therapeutic glucose control Date Initiated: 07/02/2019 Target Resolution Date: 07/30/2019 Goal Status: Active Interventions: Assess HgA1c results as ordered upon admission and as needed Assess patient nutrition upon admission and as needed per  policy Provide education on elevated blood sugars and impact on wound healing Treatment Activities: Patient referred to Primary Care Physician for further nutritional evaluation : 07/02/2019 Notes: Wound/Skin Impairment Nursing Diagnoses: Impaired tissue integrity Knowledge deficit related to ulceration/compromised skin integrity Goals: Patient/caregiver will verbalize understanding of skin care regimen Date Initiated: 07/02/2019 Target Resolution Date: 07/30/2019 Goal Status: Active Ulcer/skin breakdown will have a volume reduction of 30% by week 4 Date Initiated: 07/02/2019 Target Resolution Date: 07/30/2019 Goal Status: Active Interventions: Assess patient/caregiver ability to obtain necessary supplies Assess patient/caregiver ability to perform ulcer/skin care regimen upon admission and as needed Assess ulceration(s) every visit Provide education on ulcer and skin care Treatment Activities: Skin care regimen initiated : 07/02/2019 Topical wound management initiated : 07/02/2019 Notes: Electronic Signature(s) Signed: 07/13/2019 5:13:38 PM By: Carlene Coria RN Entered By: Carlene Coria on 07/13/2019 14:42:51 -------------------------------------------------------------------------------- Pain Assessment Details Patient Name: Date of Service: SERYNA, GURA 07/13/2019 1:30 PM Medical Record Number:  [id number] Patient Account Number: [id number] Date of Birth/Sex: Treating RN: 1945/03/31 (74 y.o. Elam Dutch Primary Care Markeem Noreen: Royetta Asal, [facility] NDO Other Clinician: Referring Armiyah Capron: Treating Gyselle Matthew/Extender: Redmond Baseman, [facility] NDO Weeks in Treatment: 1 Active Problems Location of Pain Severity and Description of Pain Patient Has Paino No Site Locations Rate the pain. Rate the pain. Current Pain Level: 0 Pain Management and Medication Current Pain Management: Electronic Signature(s) Signed: 07/13/2019 5:27:28 PM By: Baruch Gouty RN, BSN Entered By: Baruch Gouty on 07/13/2019 13:25:56 -------------------------------------------------------------------------------- Patient/Caregiver Education Details Patient Name: Date of Service: [name] 5/18/2021andnbsp1:30 PM Medical Record Number: [id number] Patient Account Number: [id number] Date of Birth/Gender: Treating RN: 1945/11/15 (74 y.o. Orvan Falconer Primary Care Physician: Royetta Asal, [facility] NDO Other Clinician: Referring Physician: Treating Physician/Extender: Redmond Baseman, FERNA NDO Weeks in Treatment: 1 Education Assessment Education Provided To: Patient Education Topics Provided Wound/Skin Impairment: Methods: Explain/Verbal Responses: State content correctly Electronic Signature(s) Signed: 07/13/2019 5:13:38 PM By: Carlene Coria RN Entered By: Carlene Coria on 07/13/2019 14:43:06 -------------------------------------------------------------------------------- Wound Assessment Details Patient Name: Date of Service: JETAUN, STIRN 07/13/2019 1:30 PM Medical Record Number: [id number] Patient Account Number: [id number] Date of Birth/Sex: Treating RN: 02-11-1946 (74 y.o. Elam Dutch Primary Care Deneane Stifter: Royetta Asal, [facility] NDO Other Clinician: Referring Dashay Giesler: Treating Truly Stankiewicz/Extender: Redmond Baseman, FERNA NDO Weeks in Treatment: 1 Wound Status Wound Number: 1 Primary Diabetic Wound/Ulcer of the Lower Extremity Etiology: Wound Location: Left Metatarsal head first Wound Open Wounding Event: Gradually Appeared Status: Date Acquired: 07/02/2019 Comorbid Congestive Heart Failure, Hypertension, Peripheral Venous Weeks Of Treatment: 1 History: Disease, Type II Diabetes, Dementia Clustered Wound: No Wound Measurements Length: (cm) 0.3 Width: (cm) 0.1 Depth: (cm) 0.4 Area: (cm) 0.024 Volume: (cm) 0.009 % Reduction in Area: 69.6% % Reduction in Volume: 62.5% Epithelialization: None Tunneling: No Undermining: Yes Starting Position (o'clock):  12 Ending Position (o'clock): 12 Maximum Distance: (cm) 0.8 Wound Description Classification: Grade 1 Wound Margin: Thickened Exudate Amount: Small Exudate Type: Purulent Exudate Color: yellow, brown, green Foul Odor After Cleansing: No Slough/Fibrino No Wound Bed Granulation Amount: Large (67-100%) Exposed Structure Granulation Quality: Red Fascia Exposed: No Necrotic Amount: None Present (0%) Fat Layer (Subcutaneous Tissue) Exposed: Yes Tendon Exposed: No Muscle Exposed: No Joint Exposed: No Bone Exposed: No Treatment Notes Wound #1 (Left Metatarsal head first) 1. Cleanse With Wound Cleanser 2. Periwound Care Skin Prep 3. Primary Dressing Applied Calcium Alginate Ag 4. Secondary Dressing Dry Gauze Roll Gauze Foam 5. Secured With Tape Notes netting. foam donut Electronic Signature(s) Signed: 07/13/2019 5:27:28 PM By: Baruch Gouty RN, BSN Entered By: Baruch Gouty on 07/13/2019 13:29:05 -------------------------------------------------------------------------------- Wound Assessment Details Patient Name: Date of Service: KALEEYA, MEEHL 07/13/2019 1:30 PM Medical Record Number: [id number] Patient Account Number: [id number] Date of Birth/Sex: Treating RN: 03-06-1945 (74 y.o. Elam Dutch Primary Care Smith Potenza: Other Clinician: Royetta Asal, [facility] NDO Referring Maddy Graham: Treating Jakira Mcfadden/Extender: Redmond Baseman, FERNA NDO Weeks in Treatment: 1 Wound Status Wound Number: 2 Primary Diabetic Wound/Ulcer of the Lower Extremity Etiology: Wound Location: Left T Third oe Wound Open Wounding Event: Gradually Appeared Status: Date Acquired: 07/02/2019 Comorbid Congestive Heart Failure, Hypertension, Peripheral Venous Weeks Of Treatment: 1 History: Disease, Type II Diabetes, Dementia Clustered Wound: No Wound Measurements Length: (cm) Width: (cm) Depth: (cm) Area: (cm) Volume: (cm) 0 % Reduction in Area: 100% 0 % Reduction in Volume: 100% 0  Epithelialization: Large (67-100%) 0 Tunneling: No 0 Undermining: No Wound Description Classification: Grade  1 Wound Margin: Flat and Intact Exudate Amount: None Present Foul Odor After Cleansing: No Slough/Fibrino No Wound Bed Granulation Amount: None Present (0%) Exposed Structure Necrotic Amount: None Present (0%) Fascia Exposed: No Fat Layer (Subcutaneous Tissue) Exposed: No Tendon Exposed: No Muscle Exposed: No Joint Exposed: No Bone Exposed: No Treatment Notes Wound #2 (Left Toe Third) 1. Cleanse With Wound Cleanser 2. Periwound Care Skin Prep 3. Primary Dressing Applied Calcium Alginate Ag 4. Secondary Dressing Dry Gauze Roll Gauze Foam 5. Secured With Tape Notes netting. foam donut Electronic Signature(s) Signed: 07/13/2019 5:27:28 PM By: Baruch Gouty RN, BSN Entered By: Baruch Gouty on 07/13/2019 13:29:17 -------------------------------------------------------------------------------- [place] Details Patient Name: Date of Service: [name] 07/13/2019 1:30 PM Medical Record Number: [id number] Patient Account Number: [id number] Date of Birth/Sex: Treating RN: 1945/04/24 (74 y.o. Elam Dutch Primary Care Aston Lieske: Royetta Asal, [facility] NDO Other Clinician: Referring Promise Bushong: Treating Abir Craine/Extender: Redmond Baseman, FERNA NDO Weeks in Treatment: 1 Vital Signs Time Taken: 13:19 Temperature (F): 98.4 Height (in): 65 Pulse (bpm): 75 Source: Stated Respiratory Rate (breaths/min): 18 Weight (lbs): 183 Blood Pressure (mmHg): 153/66 Source: Stated Reference Range: 80 - 120 mg / dl Body Mass Index (BMI): 30.4 Electronic Signature(s) Signed: 07/13/2019 5:27:28 PM By: Baruch Gouty RN, BSN Entered By: Baruch Gouty on 07/13/2019 13:19:33

## 2019-07-27 ENCOUNTER — Encounter (HOSPITAL_BASED_OUTPATIENT_CLINIC_OR_DEPARTMENT_OTHER): Payer: Medicare Other | Admitting: Internal Medicine

## 2019-08-03 ENCOUNTER — Other Ambulatory Visit: Payer: Self-pay

## 2019-08-03 ENCOUNTER — Encounter (HOSPITAL_BASED_OUTPATIENT_CLINIC_OR_DEPARTMENT_OTHER): Payer: Medicare Other | Attending: Internal Medicine | Admitting: Internal Medicine

## 2019-08-03 DIAGNOSIS — G20 Parkinson's disease: Secondary | ICD-10-CM | POA: Insufficient documentation

## 2019-08-03 DIAGNOSIS — E11621 Type 2 diabetes mellitus with foot ulcer: Secondary | ICD-10-CM | POA: Insufficient documentation

## 2019-08-03 DIAGNOSIS — F0151 Vascular dementia with behavioral disturbance: Secondary | ICD-10-CM | POA: Insufficient documentation

## 2019-08-03 DIAGNOSIS — L97522 Non-pressure chronic ulcer of other part of left foot with fat layer exposed: Secondary | ICD-10-CM | POA: Diagnosis not present

## 2019-08-03 DIAGNOSIS — Z9181 History of falling: Secondary | ICD-10-CM | POA: Diagnosis not present

## 2019-08-03 DIAGNOSIS — I509 Heart failure, unspecified: Secondary | ICD-10-CM | POA: Diagnosis not present

## 2019-08-04 NOTE — Progress Notes (Signed)
[name], [name] ([id number]) Visit Report for 08/03/2019 HPI Details Patient Name: Date of Service: [name], [name] 08/03/2019 12:30 PM Medical Record Number: [id number] Patient Account Number: [id number] Date of Birth/Sex: Treating RN: 03-13-1945 (74 y.o. Orvan Falconer Primary Care Provider: Royetta Asal, [facility] NDO Other Clinician: Referring Provider: Treating Provider/Extender: Redmond Baseman, [facility] NDO Weeks in Treatment: 4 History of Present Illness HPI Description: ADMISSION 07/02/2019. This is a 74 year old woman who was currently living in the locked unit at [facility]. She is listed as having Alzheimer's disease with behavioral disturbances perhaps some parkinsonism. She has a history of falling and probably wandering. She has been wearing gripping socks on both feet, not clear that she is actually wearing footwear. The facility has been dealing with thickly callused areas on the left first plantar met head, left fifth plantar met head, left plantar third toe tip and an area on the right lateral foot. In terms of the major area on the left first met head this is been draining and bleeding apparently she has been on doxycycline. Apparently the in-house podiatrist will not debrided anything that has an open wound. This would be difficult to understand The patient has Alzheimer's disease, type 2 diabetes, she has a history of a left foot ulcer in March 2020, congestive heart failure, parkinsonism and peripheral vascular disease ABI on the left was 1.11 5/18; patient with a wound on the tip of her left third toe and left first met head.. 6/8; the patient's wounds have closed. Both required remove meant of callus but there is no open wound underneath. Electronic Signature(s) Signed: 08/04/2019 4:17:30 PM By: Linton Ham MD Entered By: Linton Ham on 08/03/2019 13:23:40 -------------------------------------------------------------------------------- Physical  Exam Details Patient Name: Date of Service: [name], [name] 08/03/2019 12:30 PM Medical Record Number: [id number] Patient Account Number: [id number] Date of Birth/Sex: Treating RN: 11/04/1945 (74 y.o. Orvan Falconer Primary Care Provider: Royetta Asal, [facility] NDO Other Clinician: Referring Provider: Treating Provider/Extender: Redmond Baseman, [facility] NDO Weeks in Treatment: 4 Constitutional Patient is hypertensive.. Pulse regular and within target range for patient.Marland Kitchen Respirations regular, non-labored and within target range.. Temperature is normal and within the target range for the patient.Marland Kitchen Appears in no distress. Cardiovascular Pedal pulses are palpable. Notes Wound exam; first met head and a area on the tip of the left third toe. Both of these areas had some debris on the surface I gently removed with a #3 curette there is no open area in either area. She had a callus pad on the left fifth met head with a separating callus but no open area. A dressing on the right lateral first met head but underneath this again no open area I think they are simply offloading. Electronic Signature(s) Signed: 08/04/2019 4:17:30 PM By: Linton Ham MD Entered By: Linton Ham on 08/03/2019 13:25:05 -------------------------------------------------------------------------------- Physician Orders Details Patient Name: Date of Service: [name], [name] 08/03/2019 12:30 PM Medical Record Number: [id number] Patient Account Number: [id number] Date of Birth/Sex: Treating RN: 28-Aug-1945 (74 y.o. Orvan Falconer Primary Care Provider: Royetta Asal, [facility] NDO Other Clinician: Referring Provider: Treating Provider/Extender: Redmond Baseman, [facility] NDO Weeks in Treatment: 4 Verbal / Phone Orders: No Diagnosis Coding ICD-10 Coding Code Description E11.621 Type 2 diabetes mellitus with foot ulcer L97.522 Non-pressure chronic ulcer of other part of left foot with fat layer exposed F01.51 Vascular dementia  with behavioral disturbance Discharge From [facility] Services Discharge from [place] - continue callus pads  Electronic Signature(s) Signed: 08/03/2019 4:34:33 PM By: Carlene Coria RN Signed: 08/04/2019 4:17:30 PM By: Linton Ham MD Entered By: Carlene Coria on 08/03/2019 13:18:47 -------------------------------------------------------------------------------- Problem List Details Patient Name: Date of Service: [name], [name] 08/03/2019 12:30 PM Medical Record Number: [id number] Patient Account Number: [id number] Date of Birth/Sex: Treating RN: [birth year] (74 y.o. Orvan Falconer Primary Care Provider: Royetta Asal, [facility] NDO Other Clinician: Referring Provider: Treating Provider/Extender: Redmond Baseman, [facility] NDO Weeks in Treatment: 4 Active Problems ICD-10 Encounter Code Description Active Date MDM Diagnosis E11.621 Type 2 diabetes mellitus with foot ulcer 07/02/2019 No Yes L97.522 Non-pressure chronic ulcer of other part of left foot with fat layer exposed 07/02/2019 No Yes F01.51 Vascular dementia with behavioral disturbance 07/02/2019 No Yes Inactive Problems Resolved Problems Electronic Signature(s) Signed: 08/04/2019 4:17:30 PM By: Linton Ham MD Entered By: Linton Ham on 08/03/2019 13:22:45 -------------------------------------------------------------------------------- Progress Note Details Patient Name: Date of Service: [name] 08/03/2019 12:30 PM Medical Record Number: [id number] Patient Account Number: [id number] Date of Birth/Sex: Treating RN: December 02, 1945 (74 y.o. Orvan Falconer Primary Care Provider: Royetta Asal, [facility] NDO Other Clinician: Referring Provider: Treating Provider/Extender: Redmond Baseman, [facility] NDO Weeks in Treatment: 4 Subjective History of Present Illness (HPI) ADMISSION 07/02/2019. This is a 74 year old woman who was currently living in the locked unit at [facility] skilled [facility]. She is listed as having Alzheimer's  disease with behavioral disturbances perhaps some parkinsonism. She has a history of falling and probably wandering. She has been wearing gripping socks on both feet, not clear that she is actually wearing footwear. The facility has been dealing with thickly callused areas on the left first plantar met head, left fifth plantar met head, left plantar third toe tip and an area on the right lateral foot. In terms of the major area on the left first met head this is been draining and bleeding apparently she has been on doxycycline. Apparently the in-house podiatrist will not debrided anything that has an open wound. This would be difficult to understand The patient has Alzheimer's disease, type 2 diabetes, she has a history of a left foot ulcer in March 2020, congestive heart failure, parkinsonism and peripheral vascular disease ABI on the left was 1.11 5/18; patient with a wound on the tip of her left third toe and left first met head.. 6/8; the patient's wounds have closed. Both required remove meant of callus but there is no open wound underneath. Objective Constitutional Patient is hypertensive.. Pulse regular and within target range for patient.Marland Kitchen Respirations regular, non-labored and within target range.. Temperature is normal and within the target range for the patient.Marland Kitchen Appears in no distress. Vitals Time Taken: 12:51 PM, Height: 65 in, Source: Stated, Weight: 183 lbs, Source: Stated, BMI: 30.4, Temperature: 98.4 F, Pulse: 73 bpm, Respiratory Rate: 18 breaths/min, Blood Pressure: 148/79 mmHg. Cardiovascular Pedal pulses are palpable. General Notes: Wound exam; first met head and a area on the tip of the left third toe. Both of these areas had some debris on the surface I gently removed with a #3 curette there is no open area in either area. She had a callus pad on the left fifth met head with a separating callus but no open area. A dressing on the right lateral first met head but underneath  this again no open area I think they are simply offloading. Integumentary (Hair, Skin) Wound #1 status is Open. Original cause of wound was Gradually Appeared. The wound is located on the Left Metatarsal  head first. The wound measures 0cm length x 0cm width x 0cm depth; 0cm^2 area and 0cm^3 volume. There is no tunneling or undermining noted. There is a none present amount of drainage noted. The wound margin is thickened. There is no granulation within the wound bed. There is no necrotic tissue within the wound bed. Wound #2 status is Open. Original cause of wound was Gradually Appeared. The wound is located on the Left T Third. The wound measures 0cm length x oe 0cm width x 0cm depth; 0cm^2 area and 0cm^3 volume. There is no tunneling or undermining noted. There is a none present amount of drainage noted. The wound margin is flat and intact. There is no granulation within the wound bed. There is no necrotic tissue within the wound bed. Assessment Active Problems ICD-10 Type 2 diabetes mellitus with foot ulcer Non-pressure chronic ulcer of other part of left foot with fat layer exposed Vascular dementia with behavioral disturbance Plan Discharge From [facility] Services: Discharge from [place] - continue callus pads 1. The patient can be discharged from the wound care center 2. I like the callus pad for the left first met head they had on the fifth met head on the left Electronic Signature(s) Signed: 08/04/2019 4:17:30 PM By: Linton Ham MD Entered By: Linton Ham on 08/03/2019 13:25:38 -------------------------------------------------------------------------------- SuperBill Details Patient Name: Date of Service: [name] 08/03/2019 Medical Record Number: [id number] Patient Account Number: [id number] Date of Birth/Sex: Treating RN: February 06, 1946 (74 y.o. Orvan Falconer Primary Care Provider: Royetta Asal, [facility] NDO Other Clinician: Referring Provider: Treating Provider/Extender:  Redmond Baseman, [facility] NDO Weeks in Treatment: 4 Diagnosis Coding ICD-10 Codes Code Description E11.621 Type 2 diabetes mellitus with foot ulcer L97.522 Non-pressure chronic ulcer of other part of left foot with fat layer exposed F01.51 Vascular dementia with behavioral disturbance Facility Procedures CPT4 Code: [id number] Description: 99213 - WOUND CARE VISIT-LEV 3 EST PT Modifier: Quantity: 1 Physician Procedures : CPT4 Code Description Modifier [id number] - WC PHYS LEVEL 2 - EST PT ICD-10 Diagnosis Description E11.621 Type 2 diabetes mellitus with foot ulcer L97.522 Non-pressure chronic ulcer of other part of left foot with fat layer exposed F01.51 Vascular  dementia with behavioral disturbance Quantity: 1 Electronic Signature(s) Signed: 08/04/2019 4:17:30 PM By: Linton Ham MD Entered By: Linton Ham on 08/03/2019 13:25:56

## 2019-08-04 NOTE — Progress Notes (Signed)
[name], [name] ([id number]) Visit Report for 08/03/2019 Arrival Information Details Patient Name: Date of Service: [name], [name] 08/03/2019 12:30 PM Medical Record Number: [id number] Patient Account Number: [id number] Date of Birth/Sex: Treating RN: 1945/09/12 (74 y.o. Martyn Malay, Linda Primary Care Kwane Rohl: Royetta Asal, Doy Hutching NDO Other Clinician: Referring Shynice Sigel: Treating Phyliss Hulick/Extender: Redmond Baseman, FERNA NDO Weeks in Treatment: 4 Visit Information History Since Last Visit Added or deleted any medications: No Patient Arrived: Wheel Chair Any new allergies or adverse reactions: No Arrival Time: 12:49 Had a fall or experienced change in No Accompanied By: POA activities of daily living that may affect Transfer Assistance: None risk of falls: Patient Identification Verified: Yes Signs or symptoms of abuse/neglect since last visito No Secondary Verification Process Completed: Yes Hospitalized since last visit: No Patient Requires Transmission-Based Precautions: No Implantable device outside of the clinic excluding No Patient Has Alerts: No cellular tissue based products placed in the center since last visit: Has Dressing in Place as Prescribed: Yes Pain Present Now: No Electronic Signature(s) Signed: 08/03/2019 5:24:07 PM By: Baruch Gouty RN, BSN Entered By: Baruch Gouty on 08/03/2019 12:50:15 -------------------------------------------------------------------------------- Clinic Level of Care Assessment Details Patient Name: Date of Service: [name], [name] 08/03/2019 12:30 PM Medical Record Number: [id number] Patient Account Number: [id number] Date of Birth/Sex: Treating RN: 1945/03/28 (74 y.o. Orvan Falconer Primary Care Melchizedek Espinola: Royetta Asal, Doy Hutching NDO Other Clinician: Referring Nashae Maudlin: Treating Andrianna Manalang/Extender: Redmond Baseman, FERNA NDO Weeks in Treatment: 4 Clinic Level of Care Assessment Items TOOL 4 Quantity Score X- 1 0 Use when only an  EandM is performed on FOLLOW-UP visit ASSESSMENTS - Nursing Assessment / Reassessment X- 1 10 Reassessment of Co-morbidities (includes updates in patient status) X- 1 5 Reassessment of Adherence to Treatment Plan ASSESSMENTS - Wound and Skin A ssessment / Reassessment []  - 0 Simple Wound Assessment / Reassessment - one wound X- 2 5 Complex Wound Assessment / Reassessment - multiple wounds []  - 0 Dermatologic / Skin Assessment (not related to wound area) ASSESSMENTS - Focused Assessment []  - 0 Circumferential Edema Measurements - multi extremities []  - 0 Nutritional Assessment / Counseling / Intervention []  - 0 Lower Extremity Assessment (monofilament, tuning fork, pulses) []  - 0 Peripheral Arterial Disease Assessment (using hand held doppler) ASSESSMENTS - Ostomy and/or Continence Assessment and Care []  - 0 Incontinence Assessment and Management []  - 0 Ostomy Care Assessment and Management (repouching, etc.) PROCESS - Coordination of Care X - Simple Patient / Family Education for ongoing care 1 15 []  - 0 Complex (extensive) Patient / Family Education for ongoing care X- 1 10 Staff obtains Programmer, systems, Records, T Results / Process Orders est []  - 0 Staff telephones HHA, Nursing Homes / Clarify orders / etc []  - 0 Routine Transfer to another Facility (non-emergent condition) []  - 0 Routine Hospital Admission (non-emergent condition) []  - 0 New Admissions / Biomedical engineer / Ordering NPWT Apligraf, etc. , []  - 0 Emergency Hospital Admission (emergent condition) X- 1 10 Simple Discharge Coordination []  - 0 Complex (extensive) Discharge Coordination PROCESS - Special Needs []  - 0 Pediatric / Minor Patient Management []  - 0 Isolation Patient Management []  - 0 Hearing / Language / Visual special needs []  - 0 Assessment of Community assistance (transportation, D/C planning, etc.) []  - 0 Additional assistance / Altered mentation []  - 0 Support Surface(s)  Assessment (bed, cushion, seat, etc.) INTERVENTIONS - Wound Cleansing / Measurement []  - 0 Simple Wound Cleansing - one wound X- 2 5 Complex  Wound Cleansing - multiple wounds X- 1 5 Wound Imaging (photographs - any number of wounds) []  - 0 Wound Tracing (instead of photographs) []  - 0 Simple Wound Measurement - one wound X- 2 5 Complex Wound Measurement - multiple wounds INTERVENTIONS - Wound Dressings []  - 0 Small Wound Dressing one or multiple wounds []  - 0 Medium Wound Dressing one or multiple wounds []  - 0 Large Wound Dressing one or multiple wounds []  - 0 Application of Medications - topical []  - 0 Application of Medications - injection INTERVENTIONS - Miscellaneous []  - 0 External ear exam []  - 0 Specimen Collection (cultures, biopsies, blood, body fluids, etc.) []  - 0 Specimen(s) / Culture(s) sent or taken to Lab for analysis []  - 0 Patient Transfer (multiple staff / Civil Service fast streamer / Similar devices) []  - 0 Simple Staple / Suture removal (25 or less) []  - 0 Complex Staple / Suture removal (26 or more) []  - 0 Hypo / Hyperglycemic Management (close monitor of Blood Glucose) []  - 0 Ankle / Brachial Index (ABI) - do not check if billed separately X- 1 5 Vital Signs Has the patient been seen at the hospital within the last three years: Yes Total Score: 90 Level Of Care: New/Established - Level 3 Electronic Signature(s) Signed: 08/03/2019 4:34:33 PM By: Carlene Coria RN Entered By: Carlene Coria on 08/03/2019 13:24:43 -------------------------------------------------------------------------------- Lower Extremity Assessment Details Patient Name: Date of Service: [name], [name] 08/03/2019 12:30 PM Medical Record Number: [id number] Patient Account Number: [id number] Date of Birth/Sex: Treating RN: 01-04-1946 (74 y.o. Elam Dutch Primary Care Shaylon Gillean: Royetta Asal, FERNA NDO Other Clinician: Referring Major Santerre: Treating Morris Markham/Extender: Redmond Baseman, FERNA  NDO Weeks in Treatment: 4 Edema Assessment Assessed: [Left: No] [Right: No] Edema: [Left: Yes] [Right: Yes] Calf Left: Right: Point of Measurement: 37 cm From Medial Instep 39 cm cm Ankle Left: Right: Point of Measurement: 10 cm From Medial Instep 23.2 cm cm Vascular Assessment Pulses: Dorsalis Pedis Palpable: [Left:Yes] [Right:Yes] Electronic Signature(s) Signed: 08/03/2019 5:24:07 PM By: Baruch Gouty RN, BSN Entered By: Baruch Gouty on 08/03/2019 12:58:42 -------------------------------------------------------------------------------- Multi Wound Chart Details Patient Name: Date of Service: [name] 08/03/2019 12:30 PM Medical Record Number: [id number] Patient Account Number: [id number] Date of Birth/Sex: Treating RN: Jul 05, 1945 (74 y.o. Orvan Falconer Primary Care Maela Takeda: Royetta Asal, Doy Hutching NDO Other Clinician: Referring Sharday Michl: Treating Kainen Struckman/Extender: Redmond Baseman, FERNA NDO Weeks in Treatment: 4 Vital Signs Height(in): 65 Pulse(bpm): 42 Weight(lbs): 183 Blood Pressure(mmHg): 148/79 Body Mass Index(BMI): 30 Temperature(F): 98.4 Respiratory Rate(breaths/min): 18 Photos: [1:No Photos Left Metatarsal head first] [2:No Photos Left T Third oe] [N/A:N/A N/A] Wound Location: [1:Gradually Appeared] [2:Gradually Appeared] [N/A:N/A] Wounding Event: [1:Diabetic Wound/Ulcer of the Lower] [2:Diabetic Wound/Ulcer of the Lower] [N/A:N/A] Primary Etiology: [1:Extremity Congestive Heart Failure,] [2:Extremity Congestive Heart Failure,] [N/A:N/A] Comorbid History: [1:Hypertension, Peripheral Venous Disease, Type II Diabetes, Dementia 07/02/2019] [2:Hypertension, Peripheral Venous Disease, Type II Diabetes, Dementia 07/02/2019] [N/A:N/A] Date Acquired: [1:4] [2:4] [N/A:N/A] Weeks of Treatment: [1:Open] [2:Open] [N/A:N/A] Wound Status: [1:0x0x0] [2:0x0x0] [N/A:N/A] Measurements L x W x D (cm) [1:0] [2:0] [N/A:N/A] A (cm) : rea [1:0] [2:0] [N/A:N/A] Volume (cm) :  [1:100.00%] [2:100.00%] [N/A:N/A] % Reduction in A rea: [1:100.00%] [2:100.00%] [N/A:N/A] % Reduction in Volume: [1:Grade 1] [2:Grade 1] [N/A:N/A] Classification: [1:None Present] [2:None Present] [N/A:N/A] Exudate A mount: [1:Thickened] [2:Flat and Intact] [N/A:N/A] Wound Margin: [1:None Present (0%)] [2:None Present (0%)] [N/A:N/A] Granulation A mount: [1:None Present (0%)] [2:None Present (0%)] [N/A:N/A] Necrotic A mount: [1:Fascia: No] [2:Fascia: No] [  N/A:N/A] Exposed Structures: [1:Fat Layer (Subcutaneous Tissue) Exposed: No Tendon: No Muscle: No Joint: No Bone: No Large (67-100%)] [2:Fat Layer (Subcutaneous Tissue) Exposed: No Tendon: No Muscle: No Joint: No Bone: No Large (67-100%)] [N/A:N/A] Treatment Notes Electronic Signature(s) Signed: 08/03/2019 4:34:33 PM By: Carlene Coria RN Signed: 08/04/2019 4:17:30 PM By: Linton Ham MD Entered By: Linton Ham on 08/03/2019 13:23:08 -------------------------------------------------------------------------------- Multi-Disciplinary Care Plan Details Patient Name: Date of Service: [name], [name] 08/03/2019 12:30 PM Medical Record Number: [id number] Patient Account Number: [id number] Date of Birth/Sex: Treating RN: [birth year] (74 y.o. Orvan Falconer Primary Care Charnae Lill: Royetta Asal, Doy Hutching NDO Other Clinician: Referring Essex Perry: Treating Seryna Marek/Extender: Redmond Baseman, FERNA NDO Weeks in Treatment: 4 Active Inactive Electronic Signature(s) Signed: 08/03/2019 4:34:33 PM By: Carlene Coria RN Entered By: Carlene Coria on 08/03/2019 13:23:32 -------------------------------------------------------------------------------- Pain Assessment Details Patient Name: Date of Service: [name], [name] 08/03/2019 12:30 PM Medical Record Number: [id number] Patient Account Number: [id number] Date of Birth/Sex: Treating RN: 09-Jan-1946 (74 y.o. Elam Dutch Primary Care Correll Denbow: Royetta Asal, [facility] NDO Other Clinician: Referring  Kess Mcilwain: Treating Charlee Squibb/Extender: Redmond Baseman, FERNA NDO Weeks in Treatment: 4 Active Problems Location of Pain Severity and Description of Pain Patient Has Paino No Site Locations Rate the pain. Current Pain Level: 0 Pain Management and Medication Current Pain Management: Electronic Signature(s) Signed: 08/03/2019 5:24:07 PM By: Baruch Gouty RN, BSN Entered By: Baruch Gouty on 08/03/2019 12:52:37 -------------------------------------------------------------------------------- Patient/Caregiver Education Details Patient Name: Date of Service: [name] 6/8/2021andnbsp12:30 PM Medical Record Number: [id number] Patient Account Number: [id number] Date of Birth/Gender: Treating RN: 04/05/1945 (74 y.o. Orvan Falconer Primary Care Physician: Royetta Asal, [facility] NDO Other Clinician: Referring Physician: Treating Physician/Extender: Redmond Baseman, FERNA NDO Weeks in Treatment: 4 Education Assessment Education Provided To: Patient Education Topics Provided Elevated Blood Sugar/ Impact on Healing: Methods: Explain/Verbal Responses: State content correctly Electronic Signature(s) Signed: 08/03/2019 4:34:33 PM By: Carlene Coria RN Entered By: Carlene Coria on 08/03/2019 13:23:44 -------------------------------------------------------------------------------- Wound Assessment Details Patient Name: Date of Service: [name], [name] 08/03/2019 12:30 PM Medical Record Number: [id number] Patient Account Number: [id number] Date of Birth/Sex: Treating RN: 07-11-1945 (74 y.o. Orvan Falconer Primary Care Raelea Gosse: Royetta Asal, FERNA NDO Other Clinician: Referring Derrek Puff: Treating Vaun Hyndman/Extender: Redmond Baseman, FERNA NDO Weeks in Treatment: 4 Wound Status Wound Number: 1 Primary Diabetic Wound/Ulcer of the Lower Extremity Etiology: Wound Location: Left Metatarsal head first Wound Open Wounding Event: Gradually Appeared Status: Date Acquired:  07/02/2019 Comorbid Congestive Heart Failure, Hypertension, Peripheral Venous Weeks Of Treatment: 4 History: Disease, Type II Diabetes, Dementia Clustered Wound: No Photos Photo Uploaded By: Mikeal Hawthorne on 08/04/2019 14:28:53 Wound Measurements Length: (cm) Width: (cm) Depth: (cm) Area: (cm) Volume: (cm) 0 % Reduction in Area: 100% 0 % Reduction in Volume: 100% 0 Epithelialization: Large (67-100%) 0 Tunneling: No 0 Undermining: No Wound Description Classification: Grade 1 Wound Margin: Thickened Exudate Amount: None Present Foul Odor After Cleansing: No Slough/Fibrino No Wound Bed Granulation Amount: None Present (0%) Exposed Structure Necrotic Amount: None Present (0%) Fascia Exposed: No Fat Layer (Subcutaneous Tissue) Exposed: No Tendon Exposed: No Muscle Exposed: No Joint Exposed: No Bone Exposed: No Electronic Signature(s) Signed: 08/03/2019 4:34:33 PM By: Carlene Coria RN Entered By: Carlene Coria on 08/03/2019 13:18:07 -------------------------------------------------------------------------------- Wound Assessment Details Patient Name: Date of Service: [name], [name] 08/03/2019 12:30 PM Medical Record Number: [id number] Patient Account Number: [id number] Date of Birth/Sex: Treating RN: 14-Feb-1946 (74 y.o. Elam Dutch Primary Care Margarette Vannatter: Royetta Asal,  FERNA NDO Other Clinician: Referring Ceciley Buist: Treating Aribella Vavra/Extender: Redmond Baseman, FERNA NDO Weeks in Treatment: 4 Wound Status Wound Number: 2 Primary Diabetic Wound/Ulcer of the Lower Extremity Etiology: Wound Location: Left T Third oe Wound Open Wounding Event: Gradually Appeared Status: Date Acquired: 07/02/2019 Comorbid Congestive Heart Failure, Hypertension, Peripheral Venous Weeks Of Treatment: 4 History: Disease, Type II Diabetes, Dementia Clustered Wound: No Photos Photo Uploaded By: Mikeal Hawthorne on 08/04/2019 14:28:53 Wound Measurements Length: (cm) Width: (cm) Depth:  (cm) Area: (cm) Volume: (cm) 0 % Reduction in Area: 100% 0 % Reduction in Volume: 100% 0 Epithelialization: Large (67-100%) 0 Tunneling: No 0 Undermining: No Wound Description Classification: Grade 1 Wound Margin: Flat and Intact Exudate Amount: None Present Foul Odor After Cleansing: No Slough/Fibrino No Wound Bed Granulation Amount: None Present (0%) Exposed Structure Necrotic Amount: None Present (0%) Fascia Exposed: No Fat Layer (Subcutaneous Tissue) Exposed: No Tendon Exposed: No Muscle Exposed: No Joint Exposed: No Bone Exposed: No Electronic Signature(s) Signed: 08/03/2019 5:24:07 PM By: Baruch Gouty RN, BSN Entered By: Baruch Gouty on 08/03/2019 13:00:24 -------------------------------------------------------------------------------- [place] Details Patient Name: Date of Service: [name] 08/03/2019 12:30 PM Medical Record Number: [id number] Patient Account Number: [id number] Date of Birth/Sex: Treating RN: May 13, 1945 (74 y.o. Elam Dutch Primary Care Cayle Thunder: Royetta Asal, [facility] NDO Other Clinician: Referring Zuha Dejonge: Treating Kyan Giannone/Extender: Redmond Baseman, FERNA NDO Weeks in Treatment: 4 Vital Signs Time Taken: 12:51 Temperature (F): 98.4 Height (in): 65 Pulse (bpm): 73 Source: Stated Respiratory Rate (breaths/min): 18 Weight (lbs): 183 Blood Pressure (mmHg): 148/79 Source: Stated Reference Range: 80 - 120 mg / dl Body Mass Index (BMI): 30.4 Electronic Signature(s) Signed: 08/03/2019 5:24:07 PM By: Baruch Gouty RN, BSN Entered By: Baruch Gouty on 08/03/2019 12:52:21

## 2021-01-01 IMAGING — DX DG FOOT COMPLETE 3+V*R*
3 series · 3 of 3 positions shown · non-contrast
Comparison: None.

CLINICAL DATA: Diabetic with acute hyperglycemia (blood glucose
501) and acute RIGHT foot swelling and blistering. No room known
injuries.

EXAM:
RIGHT FOOT COMPLETE - 3+ VIEW

[foot ap]
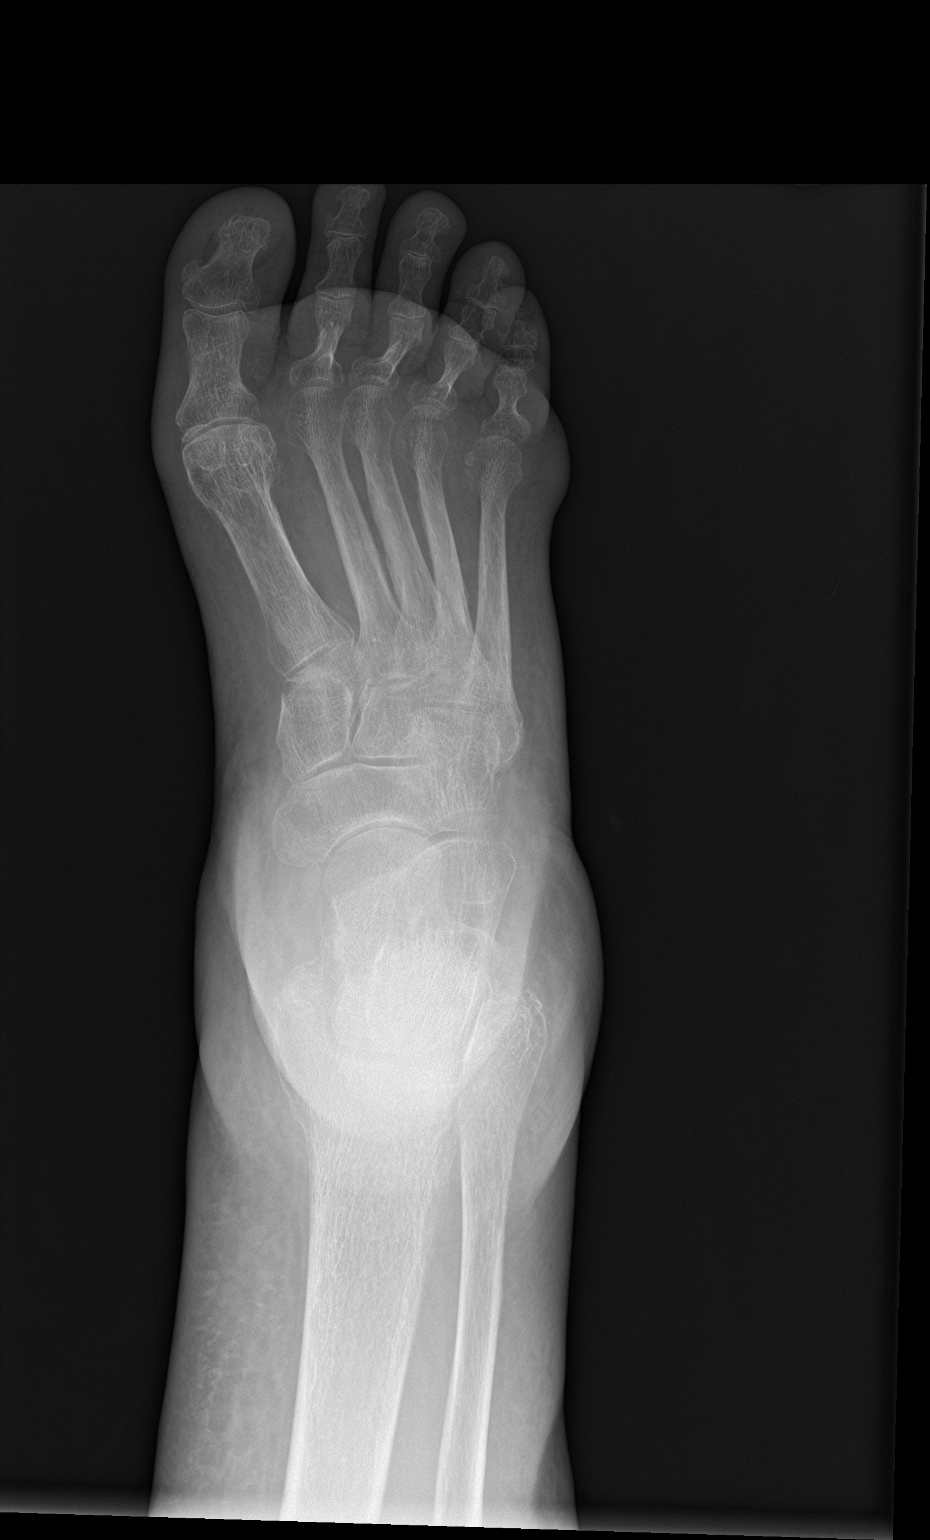

[foot obl]
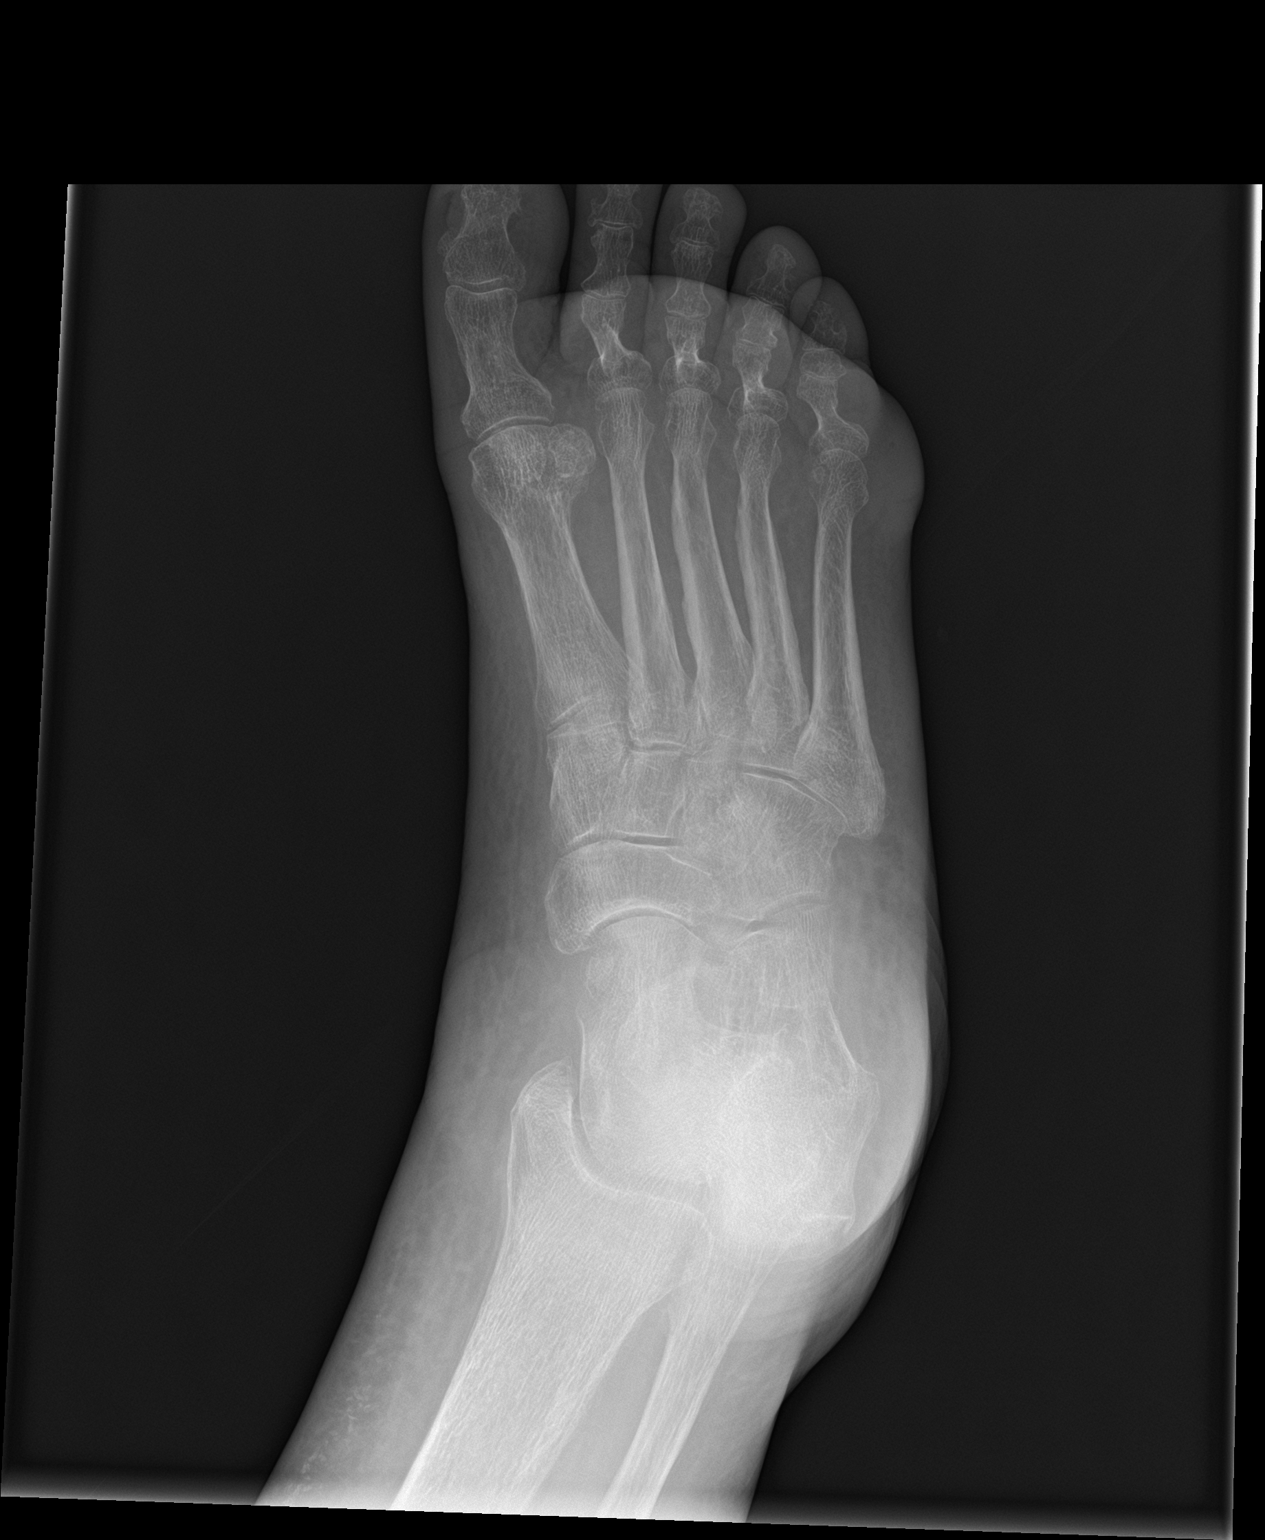

[foot lat]
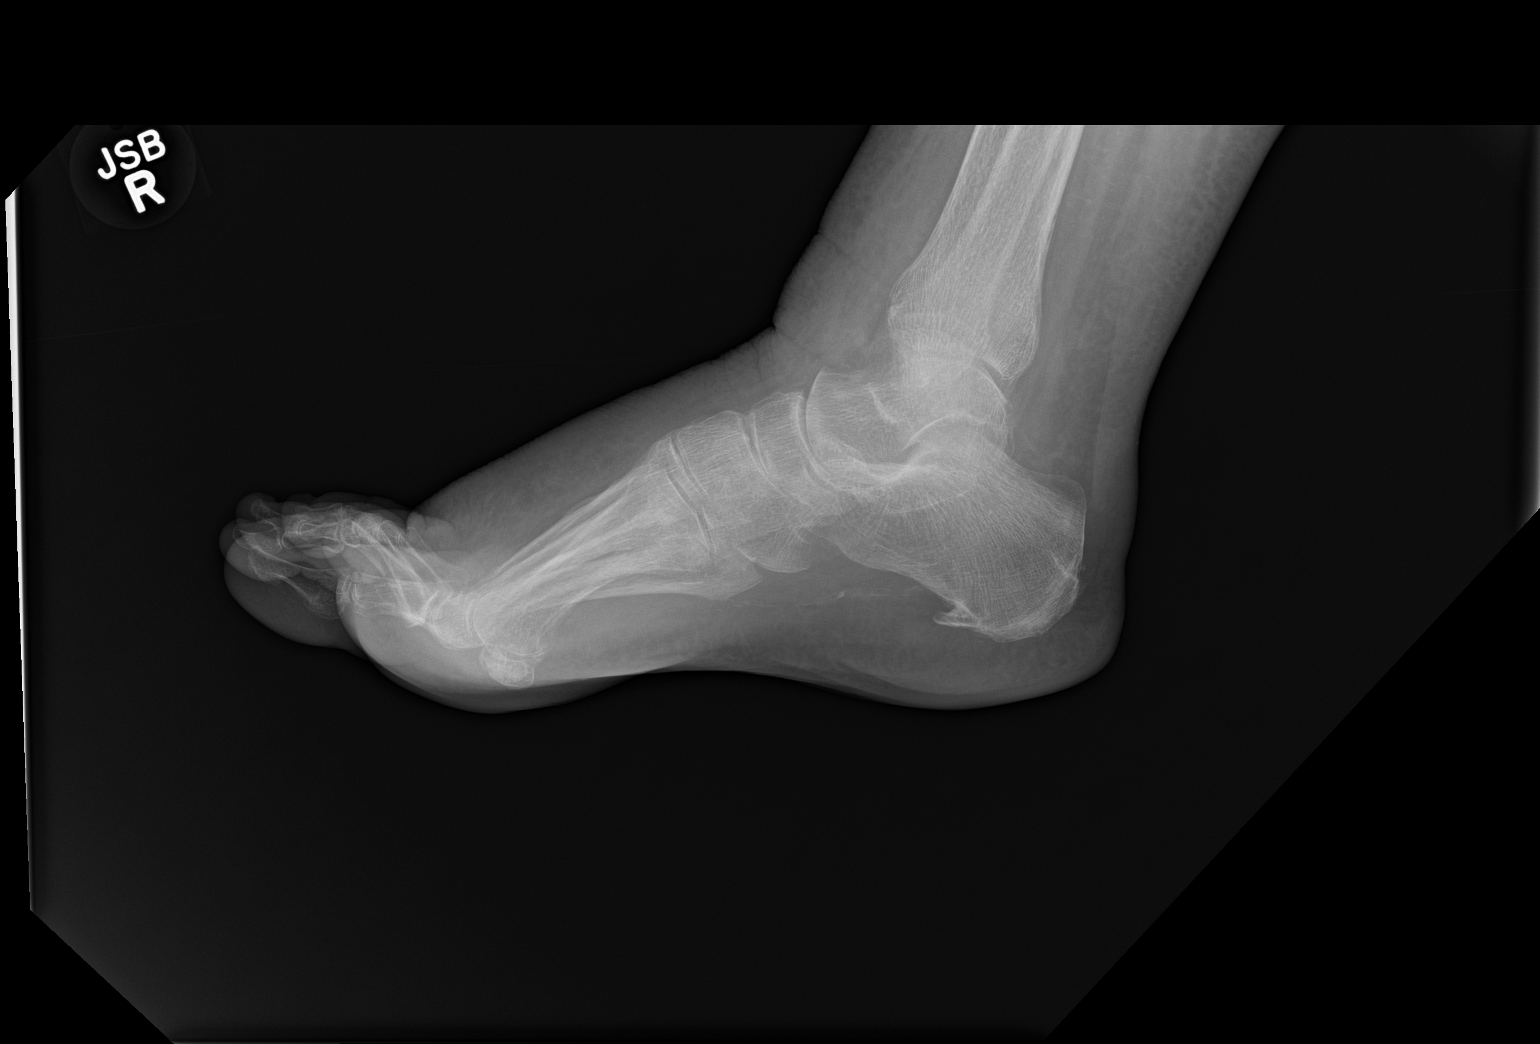

[3 of 3 positions shown; findings below may reference images not displayed]

FINDINGS: Diffuse soft tissue swelling. Osseous demineralization. No evidence
of acute, subacute or healed fractures. No evidence of
osteomyelitis. Mild-to-moderate narrowing of the IP joint spaces of
the toes. Remaining joint spaces well preserved for age. Large
plantar calcaneal spur.
IMPRESSION: No acute or subacute osseous abnormality. Osteoarthritis involving
the IP joints of the toes. Osseous demineralization.

## 2021-06-05 IMAGING — CT CT HEAD WITHOUT CONTRAST
3 of 7 series · 14 of 47 positions shown, 17 images · non-contrast
Comparison: 02/02/2018

CLINICAL DATA: Patient fell at [REDACTED] using hre walker,
fell on left side with left hip and right knee pain. Patient denies
hitting head.

EXAM:
CT HEAD WITHOUT CONTRAST
CT CERVICAL SPINE WITHOUT CONTRAST
TECHNIQUE: Multidetector CT imaging of the head and cervical spine was
performed following the standard protocol without intravenous
contrast. Multiplanar CT image reconstructions of the cervical spine
were also generated.

[Series 4: coronal soft · coronal · 0.29mm/px · 3 of 69 slices shown]
[im 12/69  brain]
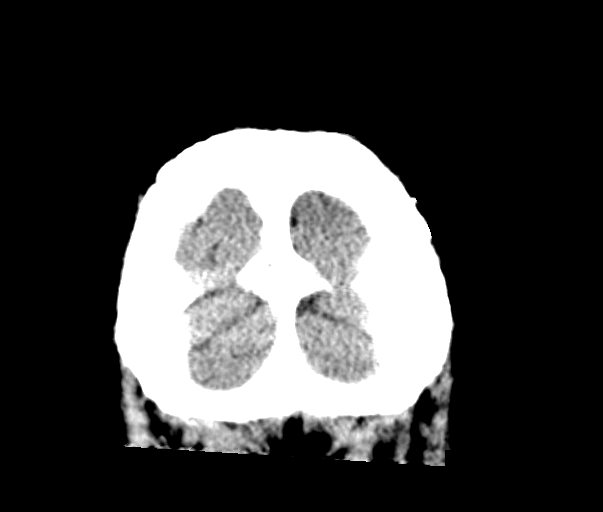
[im 23/69  brain]
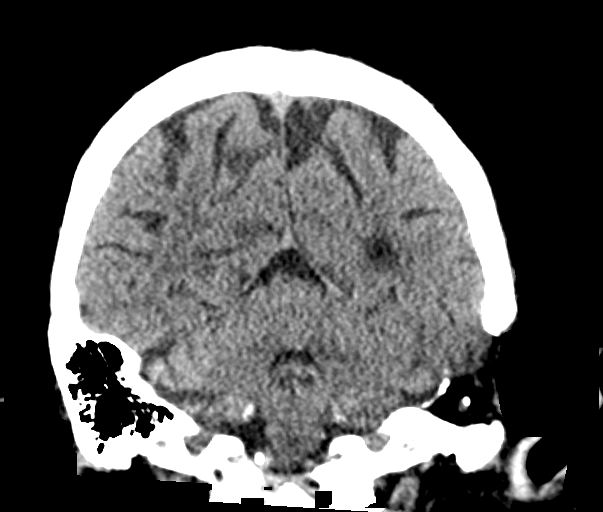
[im 35/69  brain]
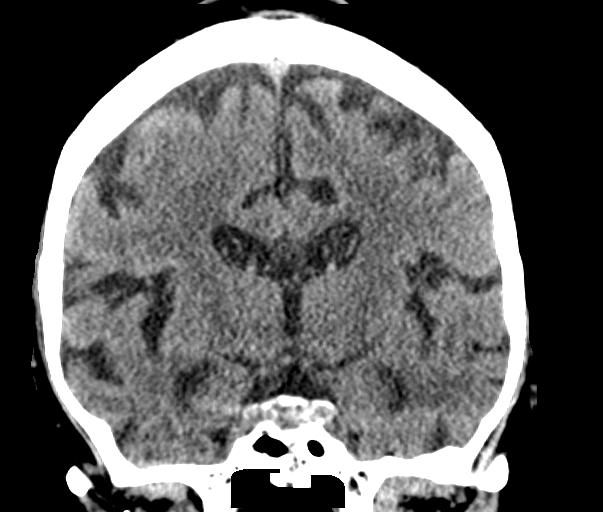

[Series 5: sagittal soft · sagittal · 0.29mm/px · 2 of 58 slices shown]
[im 20/58  brain]
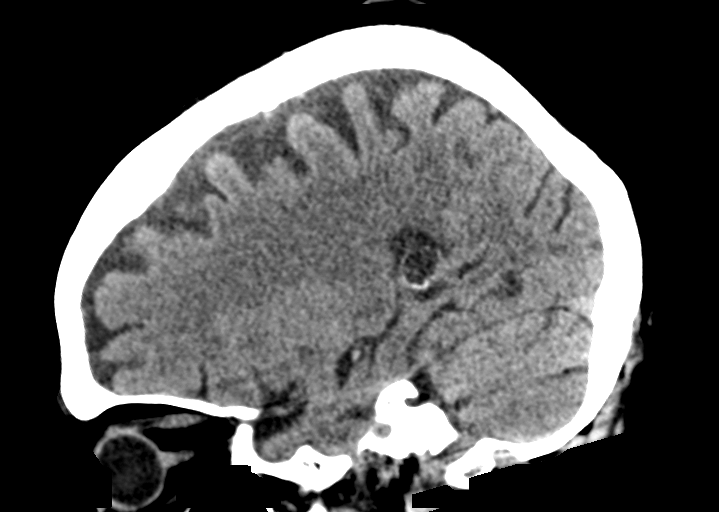
[im 39/58  brain]
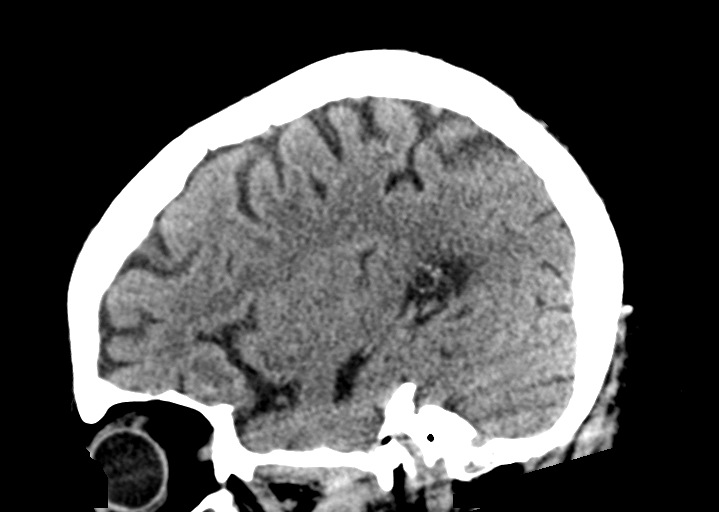

[Series 11: orthogonal axials · axial · 0.21mm/px · z∈[+1196,+1328]mm · 9 of 89 slices shown, 12 images]
[im 8/89  brain]
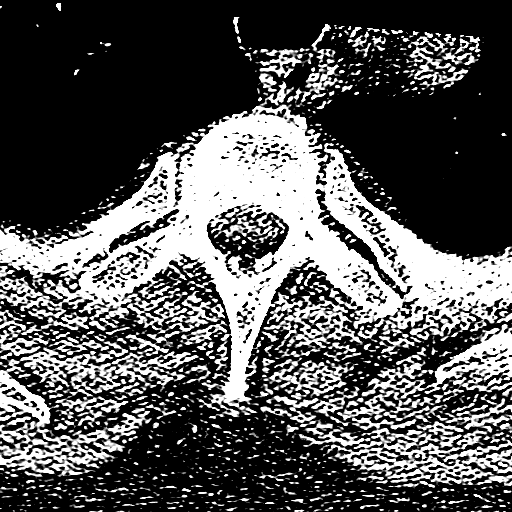
[im 8/89  bone]
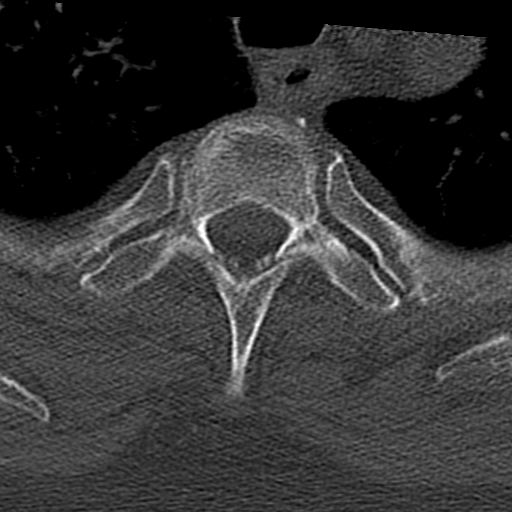
[im 15/89  brain]
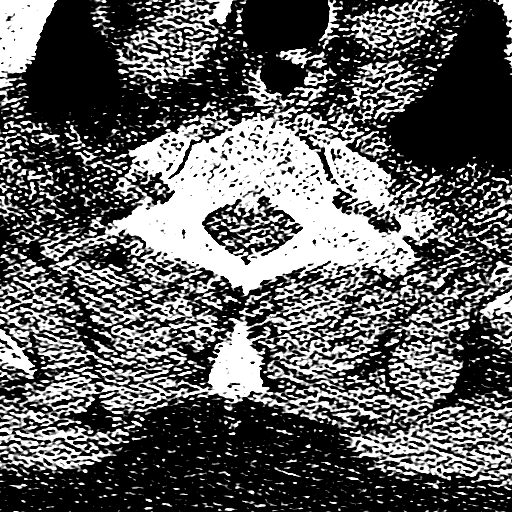
[im 30/89  brain]
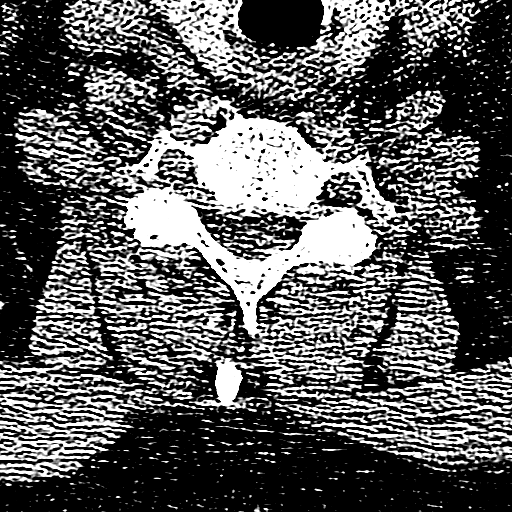
[im 37/89  brain]
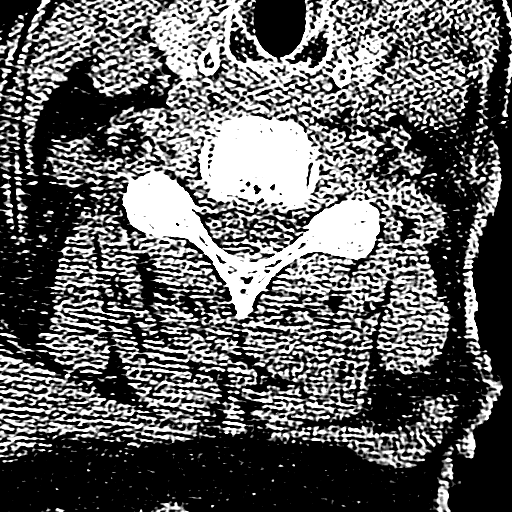
[im 45/89  brain]
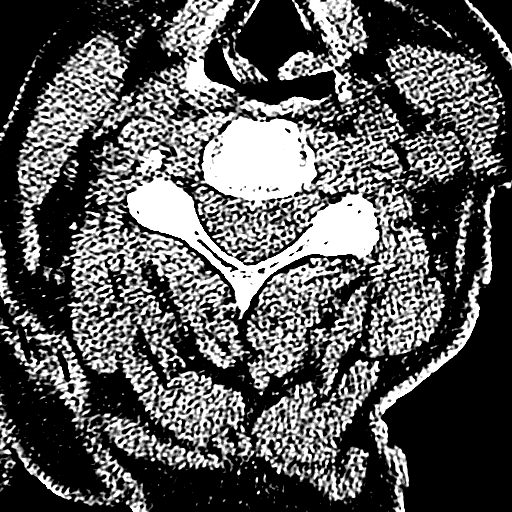
[im 45/89  bone]
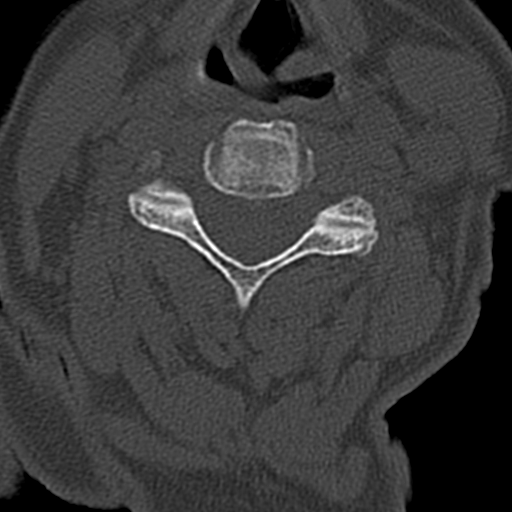
[im 52/89  brain]
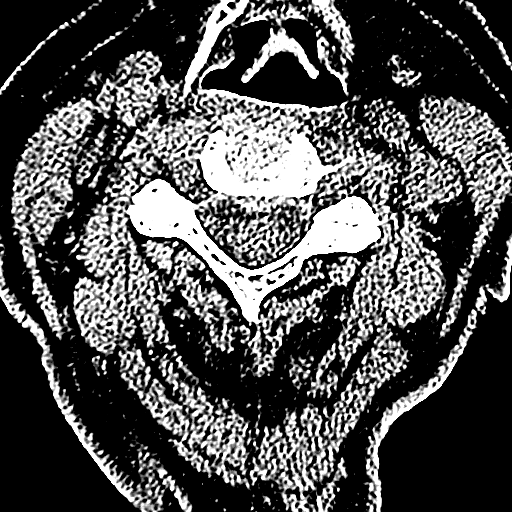
[im 59/89  brain]
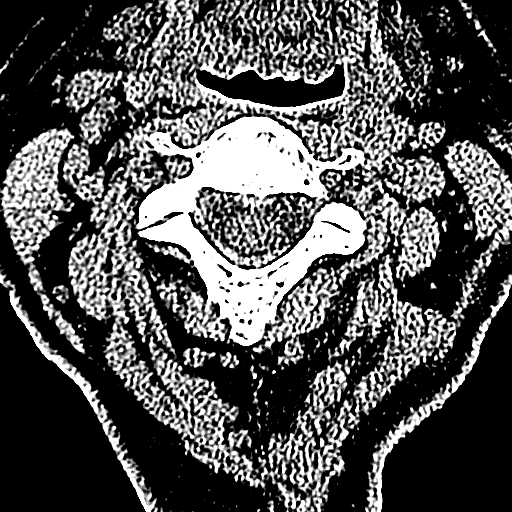
[im 74/89  brain]
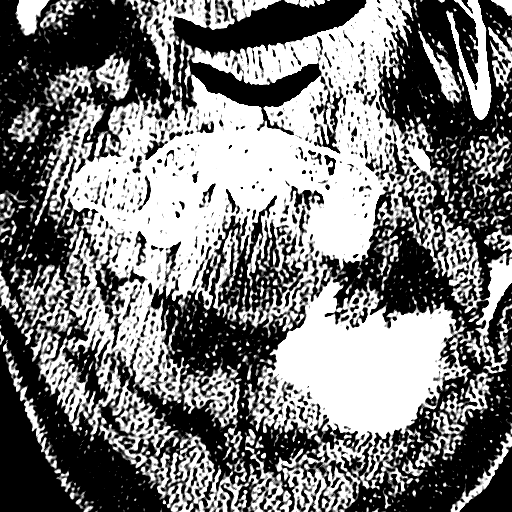
[im 81/89  brain]
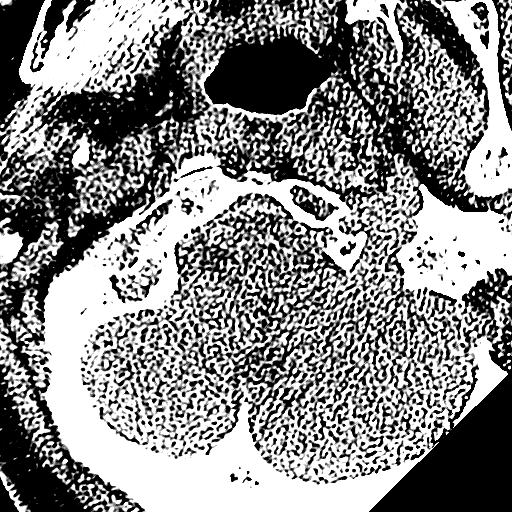
[im 81/89  bone]
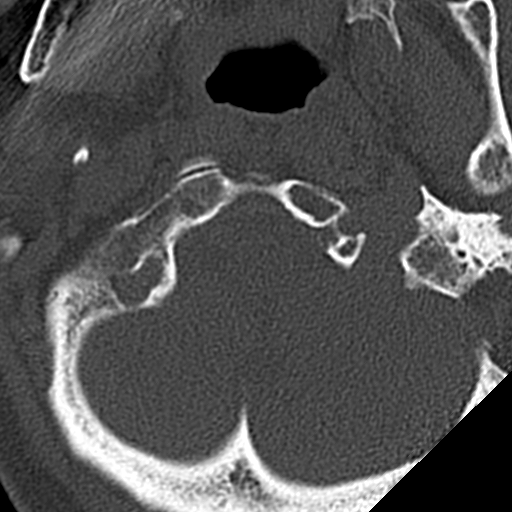

[14 of 47 positions shown; findings below may reference images not displayed]

FINDINGS: CT HEAD FINDINGS

Brain: No evidence of acute infarction, hemorrhage, hydrocephalus,
extra-axial collection or mass lesion/mass effect.

Ventricular and sulcal enlargement reflecting age-appropriate volume
loss. Mild periventricular white matter hypoattenuation is noted
consistent with chronic microvascular ischemic change.

Vascular: No hyperdense vessel or unexpected calcification.

Skull: Normal. Negative for fracture or focal lesion.

Sinuses/Orbits: Globes and orbits are unremarkable there is moderate
mucosal thickening lining the left maxillary sinus. Remaining
sinuses are clear. There changes from prior ORIF of maxillary
fractures. Previous left mastoid resection. These findings are
stable.

Other: None.

CT CERVICAL SPINE FINDINGS

Alignment: Normal.

Skull base and vertebrae: No acute fracture. No primary bone lesion
or focal pathologic process.

Soft tissues and spinal canal: No prevertebral fluid or swelling. No
visible canal hematoma.

Disc levels: Moderate loss of disc height at C6-C7 with endplate
spurring and spondylotic disc bulging. Remaining discs are well
preserved in height. No convincing disc herniation.

Upper chest: No acute findings.  Clear lung apices.

Other: None.
IMPRESSION: HEAD CT

1. No acute intracranial abnormalities.
2. Mild chronic microvascular ischemic change.
3. Chronic moderate mucosal thickening in the left maxillary sinus.

CERVICAL CT

1. No fracture or acute finding.

## 2021-07-31 ENCOUNTER — Emergency Department (HOSPITAL_COMMUNITY)
Admission: EM | Admit: 2021-07-31 | Discharge: 2021-07-31 | Disposition: A | Discharge status: Skilled Nursing Facility | Payer: Medicare Other | Attending: Emergency Medicine | Admitting: Emergency Medicine

## 2021-07-31 ENCOUNTER — Encounter (HOSPITAL_COMMUNITY): Payer: Self-pay | Admitting: *Deleted

## 2021-07-31 ENCOUNTER — Other Ambulatory Visit: Payer: Self-pay

## 2021-07-31 ENCOUNTER — Emergency Department (HOSPITAL_COMMUNITY): Payer: Medicare Other

## 2021-07-31 DIAGNOSIS — Z794 Long term (current) use of insulin: Secondary | ICD-10-CM | POA: Insufficient documentation

## 2021-07-31 DIAGNOSIS — Z79899 Other long term (current) drug therapy: Secondary | ICD-10-CM | POA: Diagnosis not present

## 2021-07-31 DIAGNOSIS — S0083XA Contusion of other part of head, initial encounter: Secondary | ICD-10-CM | POA: Insufficient documentation

## 2021-07-31 DIAGNOSIS — W010XXA Fall on same level from slipping, tripping and stumbling without subsequent striking against object, initial encounter: Secondary | ICD-10-CM | POA: Insufficient documentation

## 2021-07-31 DIAGNOSIS — Z91040 Latex allergy status: Secondary | ICD-10-CM | POA: Diagnosis not present

## 2021-07-31 DIAGNOSIS — W19XXXA Unspecified fall, initial encounter: Secondary | ICD-10-CM

## 2021-07-31 DIAGNOSIS — S0990XA Unspecified injury of head, initial encounter: Secondary | ICD-10-CM | POA: Diagnosis present

## 2021-07-31 LAB — CBC WITH DIFFERENTIAL/PLATELET
Abs Immature Granulocytes: 0.04 10*3/uL (ref 0.00–0.07)
Basophils Absolute: 0.1 10*3/uL (ref 0.0–0.1)
Basophils Relative: 1 %
Eosinophils Absolute: 0.1 10*3/uL (ref 0.0–0.5)
Eosinophils Relative: 2 %
HCT: 35.9 % — ABNORMAL LOW (ref 36.0–46.0)
Hemoglobin: 12.2 g/dL (ref 12.0–15.0)
Immature Granulocytes: 1 %
Lymphocytes Relative: 12 %
Lymphs Abs: 0.9 10*3/uL (ref 0.7–4.0)
MCH: 30.3 pg (ref 26.0–34.0)
MCHC: 34 g/dL (ref 30.0–36.0)
MCV: 89.1 fL (ref 80.0–100.0)
Monocytes Absolute: 0.5 10*3/uL (ref 0.1–1.0)
Monocytes Relative: 6 %
Neutro Abs: 6 10*3/uL (ref 1.7–7.7)
Neutrophils Relative %: 78 %
Platelets: 188 10*3/uL (ref 150–400)
RBC: 4.03 MIL/uL (ref 3.87–5.11)
RDW: 13.7 % (ref 11.5–15.5)
WBC: 7.5 10*3/uL (ref 4.0–10.5)
nRBC: 0 % (ref 0.0–0.2)

## 2021-07-31 LAB — COMPREHENSIVE METABOLIC PANEL
ALT: 13 U/L (ref 0–44)
AST: 14 U/L — ABNORMAL LOW (ref 15–41)
Albumin: 3.6 g/dL (ref 3.5–5.0)
Alkaline Phosphatase: 64 U/L (ref 38–126)
Anion gap: 9 (ref 5–15)
BUN: 32 mg/dL — ABNORMAL HIGH (ref 8–23)
CO2: 28 mmol/L (ref 22–32)
Calcium: 10.2 mg/dL (ref 8.9–10.3)
Chloride: 99 mmol/L (ref 98–111)
Creatinine, Ser: 1.78 mg/dL — ABNORMAL HIGH (ref 0.44–1.00)
GFR, Estimated: 29 mL/min — ABNORMAL LOW (ref >60)
Glucose, Bld: 282 mg/dL — ABNORMAL HIGH (ref 70–99)
Potassium: 5.9 mmol/L — ABNORMAL HIGH (ref 3.5–5.1)
Sodium: 136 mmol/L (ref 135–145)
Total Bilirubin: 0.5 mg/dL (ref 0.3–1.2)
Total Protein: 6.6 g/dL (ref 6.5–8.1)

## 2021-07-31 MED ORDER — SODIUM CHLORIDE 0.9 % IV BOLUS
1000.0000 mL | Freq: Once | INTRAVENOUS | Status: AC
Start: 2021-07-31 — End: 2021-07-31
  Administered 2021-07-31: 1000 mL via INTRAVENOUS

## 2021-07-31 NOTE — ED Notes (Signed)
Pt assisted to bathroom and back to bed via wheelchair 

## 2021-07-31 NOTE — ED Provider Notes (Signed)
[facility] EMERGENCY DEPARTMENT Provider Note   CSN: [id number] Arrival date & time: 07/31/21  1115     History  Chief Complaint  Patient presents with   [name] is a 76 y.o. female.  HPI Patient with dementia, multiple other medical problems who arrives via EMS after a fall at her facility.  Per EMS the patient had a mechanical fall, slipping on feces.  Staff reports no change in baseline interactivity.  Patient moves all extremity spontaneously, does not follow commands cannot provide any details of the history herself, level 5 caveat. Per EMS report the patient's dementia contributes to her picking at her skin and she has multiple scabs, but no new open wounds.  She did sustain a hematoma to the occiput, but has no active bleeding.     Home Medications Prior to Admission medications   Medication Sig Start Date End Date Taking? Authorizing Provider  AMINO ACIDS-PROTEIN HYDROLYS PO Take 30 mLs by mouth daily.    [provider]  Cholecalciferol (VITAMIN D3) 50 MCG (2000 UT) TABS Take 1 tablet by mouth daily.    [provider]  clonazePAM (KLONOPIN) 0.5 MG tablet Take 0.5 mg by mouth 3 (three) times daily as needed for anxiety.    [provider]  divalproex (DEPAKOTE) 250 MG DR tablet Take 250 mg by mouth 3 (three) times daily.    [provider]  donepezil (ARICEPT) 5 MG tablet Take 5 mg by mouth daily.     [provider]  HUMALOG KWIKPEN 100 UNIT/ML KiwkPen Inject 6 Units into the skin 3 (three) times daily.  05/02/16   [provider]  insulin detemir (LEVEMIR) 100 UNIT/ML injection Inject 20 Units into the skin 2 (two) times daily.     [provider]  lisinopril (ZESTRIL) 2.5 MG tablet Take 2.5 mg by mouth daily.    [provider]  Multiple Vitamins-Minerals (MULTIVITAMIN WITH MINERALS) tablet Take 1 tablet by mouth daily.    [provider]  Omega-3 Fatty Acids (FISH OIL) 1000 MG  CAPS Take 1 capsule by mouth 2 (two) times a day.    [provider]  risperiDONE (RISPERDAL) 0.25 MG tablet Take 0.25 mg by mouth 2 (two) times daily.    [provider]  sertraline (ZOLOFT) 100 MG tablet Take 100 mg by mouth daily.    [provider]  simvastatin (ZOCOR) 10 MG tablet Take 10 mg by mouth at bedtime.    [provider]  vitamin B-12 (CYANOCOBALAMIN) 1000 MCG tablet Take 1,000 mcg by mouth daily.    [provider]  vitamin C (ASCORBIC ACID) 500 MG tablet Take 500 mg by mouth daily.    [provider]      Allergies    Latex, Morphine, Adhesive [tape], Codeine, and Penicillins    Review of Systems   Review of Systems  Unable to perform ROS: Dementia   Physical Exam Updated Vital Signs BP (!) 148/68 (BP Location: Left Arm)   Pulse 70   Temp 98.6 F (37 C) (Oral)   Resp 15   SpO2 96%  Physical Exam Vitals and nursing note reviewed.  Constitutional:      General: She is not in acute distress.    Appearance: She is well-developed. She is ill-appearing. She is not toxic-appearing.  HENT:     Head: Normocephalic.   Eyes:     Conjunctiva/sclera: Conjunctivae normal.  Cardiovascular:     Rate  and Rhythm: Normal rate and regular rhythm.  Pulmonary:     Effort: Pulmonary effort is normal. No respiratory distress.     Breath sounds: Normal breath sounds. No stridor.  Abdominal:     General: There is no distension.  Skin:    General: Skin is warm and dry.  Neurological:     Mental Status: She is alert and oriented to person, place, and time.     Cranial Nerves: No cranial nerve deficit.     Comments: Moves all extremities spontaneously, does not follow commands reliably, speech is inconsistent, face is symmetric.  Psychiatric:        Behavior: Behavior is withdrawn.        Cognition and Memory: Cognition is impaired. Memory is impaired.    ED Results / Procedures / Treatments   Labs (all labs ordered are  listed, but only abnormal results are displayed) Labs Reviewed  COMPREHENSIVE METABOLIC PANEL - Abnormal; Notable for the following components:      Result Value   Potassium 5.9 (*)    Glucose, Bld 282 (*)    BUN 32 (*)    Creatinine, Ser 1.78 (*)    AST 14 (*)    GFR, Estimated 29 (*)    All other components within normal limits  CBC WITH DIFFERENTIAL/PLATELET - Abnormal; Notable for the following components:   HCT 35.9 (*)    All other components within normal limits    EKG None  Radiology CT Head Wo Contrast  Result Date: 07/31/2021 CLINICAL DATA:  Patient slipped on floor resulting in blunt trauma. EXAM: CT HEAD WITHOUT CONTRAST CT CERVICAL SPINE WITHOUT CONTRAST TECHNIQUE: Multidetector CT imaging of the head and cervical spine was performed following the standard protocol without intravenous contrast. Multiplanar CT image reconstructions of the cervical spine were also generated. RADIATION DOSE REDUCTION: This exam was performed according to the departmental dose-optimization program which includes automated exposure control, adjustment of the mA and/or kV according to patient size and/or use of iterative reconstruction technique. COMPARISON:  10/19/2018 FINDINGS: CT HEAD FINDINGS Brain: No evidence of acute infarction, hemorrhage, hydrocephalus, extra-axial collection or mass lesion/mass effect. There is mild diffuse low-attenuation within the subcortical and periventricular white matter compatible with chronic microvascular disease. Prominence of the sulci and ventricles compatible with brain atrophy. Vascular: No hyperdense vessel or unexpected calcification. Skull: Previous craniotomy defect in the region of the left mastoid air cells. Negative for fracture or focal lesion. Sinuses/Orbits: Chronic mucosal thickening involving the left maxillary sinus. Other: Left posterior scalp hematoma measures 1.7 cm in maximum thickness, image 19/2. Small right frontal scalp hematoma measures 4 mm,  image 23/2. CT CERVICAL SPINE FINDINGS Alignment: Normal. Skull base and vertebrae: No acute fracture. No primary bone lesion or focal pathologic process. Soft tissues and spinal canal: No prevertebral fluid or swelling. No visible canal hematoma. Disc levels: Disc space narrowing and endplate spurring is identified at C6-7. Upper chest: Negative. Other: None IMPRESSION: 1. No acute intracranial abnormalities. 2. Chronic microvascular disease and brain atrophy. 3. Left posterior scalp hematoma and small right frontal scalp hematoma. 4. No evidence for cervical spine fracture or subluxation. 5. Cervical degenerative disc disease. Electronically Signed   By: Kerby Moors M.D.   On: 07/31/2021 12:02   CT Cervical Spine Wo Contrast  Result Date: 07/31/2021 CLINICAL DATA:  Patient slipped on floor resulting in blunt trauma. EXAM: CT HEAD WITHOUT CONTRAST CT CERVICAL SPINE WITHOUT CONTRAST TECHNIQUE: Multidetector CT imaging of the head and cervical spine  was performed following the standard protocol without intravenous contrast. Multiplanar CT image reconstructions of the cervical spine were also generated. RADIATION DOSE REDUCTION: This exam was performed according to the departmental dose-optimization program which includes automated exposure control, adjustment of the mA and/or kV according to patient size and/or use of iterative reconstruction technique. COMPARISON:  10/19/2018 FINDINGS: CT HEAD FINDINGS Brain: No evidence of acute infarction, hemorrhage, hydrocephalus, extra-axial collection or mass lesion/mass effect. There is mild diffuse low-attenuation within the subcortical and periventricular white matter compatible with chronic microvascular disease. Prominence of the sulci and ventricles compatible with brain atrophy. Vascular: No hyperdense vessel or unexpected calcification. Skull: Previous craniotomy defect in the region of the left mastoid air cells. Negative for fracture or focal lesion.  Sinuses/Orbits: Chronic mucosal thickening involving the left maxillary sinus. Other: Left posterior scalp hematoma measures 1.7 cm in maximum thickness, image 19/2. Small right frontal scalp hematoma measures 4 mm, image 23/2. CT CERVICAL SPINE FINDINGS Alignment: Normal. Skull base and vertebrae: No acute fracture. No primary bone lesion or focal pathologic process. Soft tissues and spinal canal: No prevertebral fluid or swelling. No visible canal hematoma. Disc levels: Disc space narrowing and endplate spurring is identified at C6-7. Upper chest: Negative. Other: None IMPRESSION: 1. No acute intracranial abnormalities. 2. Chronic microvascular disease and brain atrophy. 3. Left posterior scalp hematoma and small right frontal scalp hematoma. 4. No evidence for cervical spine fracture or subluxation. 5. Cervical degenerative disc disease. Electronically Signed   By: Kerby Moors M.D.   On: 07/31/2021 12:02    Procedures Procedures    Medications Ordered in ED Medications  sodium chloride 0.9 % bolus 1,000 mL (has no administration in time range)    ED Course/ Medical Decision Making/ A&P This patient with a Hx of dementia presents to the ED for concern of fall, this involves an extensive number of treatment options, and is a complaint that carries with it a high risk of complications and morbidity.    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   Patient placed on Cardiac and Pulse-Oximetry Monitors. The patient was maintained on a cardiac monitor.  The cardiac monitored showed an rhythm of 70 sinus normal The patient was also maintained on pulse oximetry. The readings were typically 100% room air  normal   On repeat evaluation of the patient stayed the same  Lab Tests:  I personally interpreted labs.  The pertinent results include: Elevated creatinine, BUN, concern for dehydration.  Mild hyperkalemia  Imaging Studies ordered:  I independently visualized and interpreted imaging which showed no acute intracranial abnormalities, no new fracture skull, neck I agree with the radiologist interpretation   Dispostion / Final MDM:  After consideration of the diagnostic results and the patient's response to treatment, patient remains unchanged, hemodynamically unremarkable, though she has lab evidence for dehydration, there is no evidence for hemodynamic instability, there is some suspicion for this developing over a longer time course. After fluid resuscitation, with no other acute changes I discussed the patient's case with department of social services of the county, due to her guardianship, to notify them of discharge.  Final Clinical Impression(s) / ED Diagnoses Final diagnoses:  Fall, initial encounter     Carmin Muskrat, MD 07/31/21 1343

## 2021-07-31 NOTE — Discharge Instructions (Addendum)
As discussed, today's evaluation has been most notable for evidence of dehydration.  Please stay well-hydrated, follow-up with your physician, and do not hesitate to return here for concerning changes.

## 2021-07-31 NOTE — ED Triage Notes (Signed)
Pt brought in by ems for c/o fall from standing position; pt slipped on feces in the floor; pt has hematoma to back of head; pt has severe dementia and is only oriented to self;   Pt denies any other complaints

## 2021-12-19 ENCOUNTER — Emergency Department (HOSPITAL_COMMUNITY)
Admission: EM | Admit: 2021-12-19 | Discharge: 2021-12-20 | Disposition: A | Discharge status: Skilled Nursing Facility | Payer: Medicare Other | Attending: Student | Admitting: Student

## 2021-12-19 ENCOUNTER — Encounter (HOSPITAL_COMMUNITY): Payer: Self-pay

## 2021-12-19 ENCOUNTER — Emergency Department (HOSPITAL_COMMUNITY): Payer: Medicare Other

## 2021-12-19 ENCOUNTER — Other Ambulatory Visit: Payer: Self-pay

## 2021-12-19 DIAGNOSIS — Z79899 Other long term (current) drug therapy: Secondary | ICD-10-CM | POA: Insufficient documentation

## 2021-12-19 DIAGNOSIS — J45909 Unspecified asthma, uncomplicated: Secondary | ICD-10-CM | POA: Diagnosis not present

## 2021-12-19 DIAGNOSIS — Z794 Long term (current) use of insulin: Secondary | ICD-10-CM | POA: Insufficient documentation

## 2021-12-19 DIAGNOSIS — K625 Hemorrhage of anus and rectum: Secondary | ICD-10-CM | POA: Diagnosis not present

## 2021-12-19 DIAGNOSIS — G20C Parkinsonism, unspecified: Secondary | ICD-10-CM | POA: Diagnosis not present

## 2021-12-19 DIAGNOSIS — E119 Type 2 diabetes mellitus without complications: Secondary | ICD-10-CM | POA: Diagnosis not present

## 2021-12-19 DIAGNOSIS — I10 Essential (primary) hypertension: Secondary | ICD-10-CM | POA: Insufficient documentation

## 2021-12-19 DIAGNOSIS — I2510 Atherosclerotic heart disease of native coronary artery without angina pectoris: Secondary | ICD-10-CM | POA: Insufficient documentation

## 2021-12-19 DIAGNOSIS — K5900 Constipation, unspecified: Secondary | ICD-10-CM

## 2021-12-19 DIAGNOSIS — Z951 Presence of aortocoronary bypass graft: Secondary | ICD-10-CM | POA: Insufficient documentation

## 2021-12-19 DIAGNOSIS — F0390 Unspecified dementia without behavioral disturbance: Secondary | ICD-10-CM | POA: Diagnosis not present

## 2021-12-19 DIAGNOSIS — N390 Urinary tract infection, site not specified: Secondary | ICD-10-CM

## 2021-12-19 LAB — COMPREHENSIVE METABOLIC PANEL
ALT: 7 U/L (ref 0–44)
AST: 15 U/L (ref 15–41)
Albumin: 3 g/dL — ABNORMAL LOW (ref 3.5–5.0)
Alkaline Phosphatase: 91 U/L (ref 38–126)
Anion gap: 8 (ref 5–15)
BUN: 61 mg/dL — ABNORMAL HIGH (ref 8–23)
CO2: 31 mmol/L (ref 22–32)
Calcium: 11 mg/dL — ABNORMAL HIGH (ref 8.9–10.3)
Chloride: 95 mmol/L — ABNORMAL LOW (ref 98–111)
Creatinine, Ser: 1.52 mg/dL — ABNORMAL HIGH (ref 0.44–1.00)
GFR, Estimated: 35 mL/min — ABNORMAL LOW (ref >60)
Glucose, Bld: 419 mg/dL — ABNORMAL HIGH (ref 70–99)
Potassium: 4.5 mmol/L (ref 3.5–5.1)
Sodium: 134 mmol/L — ABNORMAL LOW (ref 135–145)
Total Bilirubin: 0.3 mg/dL (ref 0.3–1.2)
Total Protein: 7.4 g/dL (ref 6.5–8.1)

## 2021-12-19 LAB — CBC WITH DIFFERENTIAL/PLATELET
Abs Immature Granulocytes: 0.05 10*3/uL (ref 0.00–0.07)
Basophils Absolute: 0 10*3/uL (ref 0.0–0.1)
Basophils Relative: 0 %
Eosinophils Absolute: 0.1 10*3/uL (ref 0.0–0.5)
Eosinophils Relative: 1 %
HCT: 39.8 % (ref 36.0–46.0)
Hemoglobin: 13.4 g/dL (ref 12.0–15.0)
Immature Granulocytes: 1 %
Lymphocytes Relative: 11 %
Lymphs Abs: 1.1 10*3/uL (ref 0.7–4.0)
MCH: 30.2 pg (ref 26.0–34.0)
MCHC: 33.7 g/dL (ref 30.0–36.0)
MCV: 89.8 fL (ref 80.0–100.0)
Monocytes Absolute: 0.7 10*3/uL (ref 0.1–1.0)
Monocytes Relative: 7 %
Neutro Abs: 8.1 10*3/uL — ABNORMAL HIGH (ref 1.7–7.7)
Neutrophils Relative %: 80 %
Platelets: 272 10*3/uL (ref 150–400)
RBC: 4.43 MIL/uL (ref 3.87–5.11)
RDW: 13.7 % (ref 11.5–15.5)
WBC: 10.1 10*3/uL (ref 4.0–10.5)
nRBC: 0 % (ref 0.0–0.2)

## 2021-12-19 LAB — URINALYSIS, ROUTINE W REFLEX MICROSCOPIC
Bilirubin Urine: NEGATIVE
Glucose, UA: >=500 mg/dL — AB
Ketones, ur: NEGATIVE mg/dL
Nitrite: NEGATIVE
Protein, ur: 30 mg/dL — AB
Specific Gravity, Urine: 1.013 (ref 1.005–1.030)
WBC, UA: >50 WBC/hpf — ABNORMAL HIGH (ref 0–5)
pH: 5 (ref 5.0–8.0)

## 2021-12-19 LAB — TYPE AND SCREEN
ABO/RH(D): O POS
Antibody Screen: NEGATIVE

## 2021-12-19 LAB — PROTIME-INR
INR: 1 (ref 0.8–1.2)
Prothrombin Time: 13.1 seconds (ref 11.4–15.2)

## 2021-12-19 MED ORDER — SODIUM CHLORIDE 0.9 % IV SOLN
2.0000 g | Freq: Once | INTRAVENOUS | Status: AC
  Administered 2021-12-19: 2 g via INTRAVENOUS
  Filled 2021-12-19 (×2): qty 20

## 2021-12-19 MED ORDER — CEFADROXIL 500 MG PO CAPS: 500.0000 mg | ORAL_CAPSULE | Freq: Two times a day (BID) | ORAL | 0 refills | Status: AC

## 2021-12-19 MED ORDER — IOHEXOL 350 MG/ML SOLN
80.0000 mL | Freq: Once | INTRAVENOUS | Status: AC | PRN
  Administered 2021-12-19: 80 mL via INTRAVENOUS

## 2021-12-19 NOTE — ED Notes (Signed)
Pt's DSS worker, Chance, called to state pt would be coming to hospital and she could be reached at [phone] when her disposition has been set

## 2021-12-19 NOTE — ED Provider Notes (Signed)
[facility] EMERGENCY DEPARTMENT Provider Note  CSN: [id number] Arrival date & time: 12/19/21 1555  Chief Complaint(s) Rectal Bleeding  HPI [name] is a 76 y.o. female with PMH adenocarcinoma status post right hemicolectomy, dementia, CAD, Parkinson's disease who presents emergency department for evaluation of rectal bleeding.  Patient is demented and unable to provide her own history but I did speak with [place] staff member at the patient's nursing home who states that the patient's family member does not want aggressive treatment pursued if necessary, but the rectal bleeding is new.  Additional history unable to be obtained.   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 Sonny Dandy)   Anginal pain [facility])    DR Theola Sequin     Anxiety    Asthma    AS CHILD    Blurred vision    Coronary artery disease    a. s/p stent to lad 2006 w subsequent kissing balloon pci to diagonal;  b.  pci of the lad w a drug-eluting stent July 2010;  c.  LHC 11/13/11: p-mLAD stent with prox 50% ISR, 80% beyond dist edge of stent, m+dLAD beyond stent 40-50%, mod caliber pD1 80%, mRCA 70%, m-dRCA 20-30%, EF 55-65% => med Rx (consider CABG)   Depressed    Diabetes mellitus    Dyslipidemia    Fibromyalgia    Gastroparesis    GERD (gastroesophageal reflux disease)    Headache(784.0)    Hiatal hernia    Hypertension    Morbid obesity ([place])    Osteoarthritis    Parkinson disease    ambulates with cane   Sleep apnea    12+ YRS NO MACHINE GOTTEN    Varicose vein    Patient Active Problem List   Diagnosis Date Noted   Delirium 02/14/2016   Lactic acid increased 02/14/2016   Cellulitis 03/26/2013   Vomiting 02/08/2012   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/24/2012   Recurrent ventral incisional hernia 09/26/2010   Obesity, Class III, BMI 40-49.9 (morbid obesity)  ([place]) 09/26/2010   HYPERTENSION, BENIGN 02/06/2009   PERSONAL HISTORY MALIG NEOPLASM LARGE INTESTINE 12/14/2008   COUGH 29/92/4268   DIASTOLIC HEART FAILURE, CHRONIC 09/27/2008   CAD, NATIVE VESSEL 09/14/2008   CHEST PAIN UNSPECIFIED 09/13/2008   Malignant neoplasm of ascending colon ([place]) 12/07/2007   BENIGN NEOPLASM OF COLON 09/23/2007   Diabetes ([place]) 09/23/2007   IRON DEFICIENCY 09/23/2007   GASTROPARESIS 09/23/2007   FIBROMYALGIA 09/23/2007   FECAL OCCULT BLOOD 09/23/2007   Home Medication(s) Prior to Admission medications   Medication Sig Start Date End Date Taking? Authorizing Provider  AMINO ACIDS-PROTEIN HYDROLYS PO Take 30 mLs by mouth daily.    [provider]  Cholecalciferol (VITAMIN D3) 50 MCG (2000 UT) TABS Take 1 tablet by mouth daily.    [provider]  clonazePAM (KLONOPIN) 0.5 MG tablet Take 0.5 mg by mouth 3 (three) times daily as needed for anxiety.    [provider]  divalproex (DEPAKOTE) 250 MG DR tablet Take 250 mg by mouth 3 (three) times daily.    [provider]  donepezil (ARICEPT) 5 MG tablet Take 5 mg by mouth daily.     [provider]  HUMALOG KWIKPEN 100 UNIT/ML KiwkPen Inject 6 Units into the skin 3 (three) times daily.  05/02/16   [provider]  insulin detemir (LEVEMIR) 100 UNIT/ML injection Inject 20 Units into the skin 2 (two) times daily.     [provider]  lisinopril (ZESTRIL) 2.5 MG tablet Take 2.5 mg by mouth daily.    [provider]  Multiple Vitamins-Minerals (MULTIVITAMIN WITH MINERALS) tablet Take 1 tablet by mouth daily.    [provider]  Omega-3 Fatty Acids (FISH OIL) 1000 MG CAPS Take 1 capsule by mouth 2 (two) times a day.    [provider]  risperiDONE (RISPERDAL) 0.25 MG tablet Take 0.25 mg by mouth 2 (two) times daily.    [provider]  sertraline (ZOLOFT) 100 MG tablet Take 100 mg by mouth daily.    [provider]   simvastatin (ZOCOR) 10 MG tablet Take 10 mg by mouth at bedtime.    [provider]  vitamin B-12 (CYANOCOBALAMIN) 1000 MCG tablet Take 1,000 mcg by mouth daily.    [provider]  vitamin C (ASCORBIC ACID) 500 MG tablet Take 500 mg by mouth daily.    [provider]                                                                                                                                    Past Surgical History Past Surgical History:  Procedure Laterality Date   ABDOMINAL HYSTERECTOMY  1993   APPENDECTOMY     APPLICATION OF WOUND VAC N/A 03/29/2013   Procedure: APPLICATION OF WOUND VAC;  Surgeon: Jamesetta So, MD;  Location: [facility];  Service: General;  Laterality: N/A;   [place] N/A 09/21/2013   Procedure: COLONOSCOPY;  Surgeon: Inda Castle, MD;  Location: [facility] END[facility];  Service: Endoscopy;  Laterality: N/A;   CORONARY ARTERY BYPASS GRAFT  01/20/2012   Procedure: CORONARY ARTERY BYPASS GRAFTING (CABG);  Surgeon: Gaye Pollack, MD;  Location: [facility];  Service: Open Heart Surgery;  Laterality: N/A;  Coronary artery bypass graft times three utilizing the left internal mammary artery and the right greater saphenous vein harvested endoscopically   ENDOVEIN HARVEST OF GREATER SAPHENOUS VEIN  01/20/2012   Procedure: ENDOVEIN HARVEST OF GREATER SAPHENOUS VEIN;  Surgeon: Gaye Pollack, MD;  Location: [facility];  Service: Open Heart Surgery;  Laterality: Right;   ESOPHAGOGASTRODUODENOSCOPY N/A 04/19/2014   Procedure: ESOPHAGOGASTRODUODENOSCOPY (EGD);  Surgeon: Jamesetta So, MD;  Location[facility];  Service: Gastroenterology;  Laterality: N/A;   EXCISION MORTON'S NEUROMA     EYE SURGERY     cataract on right 2005, left 2008   FETAL SURGERY FOR CONGENITAL HERNIA     FINGER REPLANTATION     RECENTLY    HEMICOLECTOMY  12/28/07   right for  adenocarcinoma of ascending colon   HERNIA REPAIR  1990, 2002, 2007   open x1, lap x2    herniaorraphy  01/22/06   for recurrent ventral hernia   [place] N/A 03/29/2013   Procedure: INCISION AND DRAINAGE ABDOMINAL WALL ABSCESS;  Surgeon: Jamesetta So, MD;  Location: [facility];  Service: General;  Laterality: N/A;   JOINT REPLACEMENT  2005 and 2006   right 2005, left 2006   KNEE ARTHROSCOPY     LAPAROTOMY     for small bowl obstruction   LEFT HEART CATHETERIZATION WITH CORONARY ANGIOGRAM N/A 11/13/2011   Procedure: LEFT HEART CATHETERIZATION WITH CORONARY ANGIOGRAM;  Surgeon: Sherren Mocha, MD;  Location: [facility] CATH LAB;  Service: Cardiovascular;  Laterality: N/A;   LESION REMOVAL  10/08/2011   Procedure: LESION REMOVAL;  Surgeon: Sanjuana Kava, MD;  Location: [facility];  Service: Orthopedics;  Laterality: Right;  Removal Lesion Right Long Finger   MANDIBLE SURGERY     PORT-A-CATH REMOVAL Right 06/17/2014   Procedure: REMOVAL PORT-A-CATH;  Surgeon: Aviva Signs Md, MD;  Location: [facility];  Service: General;  Laterality: Right;   PORTACATH PLACEMENT     REPLACEMENT TOTAL KNEE     resecton of acoustic neuroma     stent surgery     TONSILLECTOMY     Family History Family History  Problem Relation Age of Onset   Diabetes Maternal Grandmother        unspecif if Maternal or Paternal   Heart failure Mother    Cirrhosis Father    Coronary artery disease Other        family history   Asthma Other        family history-grandfather   Colon cancer Other        family hx    Social History Social History   Tobacco Use   Smoking status: Never   Smokeless tobacco: Never  Vaping Use   Vaping Use: Never used  Substance Use Topics   Alcohol use: No    Comment: occasional   Drug use: No   Allergies Latex, Morphine, Adhesive [tape], Codeine, and Penicillins  Review of Systems Review of Systems  Unable to perform ROS: Dementia   *** Physical  Exam Vital Signs  I have reviewed the triage vital signs BP (!) 155/48   Pulse 78   Temp 98.1 F (36.7 C) (Axillary)   Resp 17   Ht '5\' 5"'$  (1.651 m)   Wt 81.6 kg   SpO2 98%   BMI 29.94 kg/m  *** Physical Exam Vitals and nursing note reviewed.  Constitutional:      General: She is not in acute distress.    Appearance: She is well-developed.  HENT:     Head: Normocephalic and atraumatic.  Eyes:     Conjunctiva/sclera: Conjunctivae normal.  Cardiovascular:     Rate and Rhythm: Normal rate and regular rhythm.     Heart sounds: No murmur heard. Pulmonary:     Effort: Pulmonary effort is normal. No respiratory distress.     Breath sounds: Normal breath sounds.  Abdominal:     Palpations: Abdomen is soft.     Tenderness: There is no abdominal tenderness.  Genitourinary:    Comments: Significant stool burden seen at the rectum, bright red blood seen Musculoskeletal:        General: No swelling.     Cervical back: Neck supple.  Skin:    General: Skin is warm and dry.     Capillary Refill: Capillary  refill takes less than 2 seconds.  Neurological:     Mental Status: She is alert. Mental status is at baseline. She is disoriented.     ED Results and Treatments Labs (all labs ordered are listed, but only abnormal results are displayed) Labs Reviewed  COMPREHENSIVE METABOLIC PANEL - Abnormal; Notable for the following components:      Result Value   Sodium 134 (*)    Chloride 95 (*)    Glucose, Bld 419 (*)    BUN 61 (*)    Creatinine, Ser 1.52 (*)    Calcium 11.0 (*)    Albumin 3.0 (*)    GFR, Estimated 35 (*)    All other components within normal limits  CBC WITH DIFFERENTIAL/PLATELET - Abnormal; Notable for the following components:   Neutro Abs 8.1 (*)    All other components within normal limits  PROTIME-INR  TYPE AND SCREEN                                                                                                                          Radiology No  results found.  Pertinent labs & imaging results that were available during my care of the patient were reviewed by me and considered in my medical decision making (see MDM for details).  Medications Ordered in ED Medications  iohexol (OMNIPAQUE) 350 MG/ML injection 80 mL (80 mLs Intravenous Contrast Given 12/19/21 1746)                                                                                                                                     Procedures Procedures  (including critical care time)  Medical Decision Making / ED Course   This patient presents to the ED for concern of ***, this involves an extensive number of treatment options, and is a complaint that carries with it a high risk of complications and morbidity.  The differential diagnosis includes ***  MDM: ***   Additional history obtained: -Additional history obtained from *** -External records from outside source obtained and reviewed including: Chart review including previous notes, labs, imaging, consultation notes   Lab Tests: -I ordered, reviewed, and interpreted labs.   The pertinent results include:   Labs Reviewed  COMPREHENSIVE METABOLIC PANEL - Abnormal; Notable for the following components:      Result Value   Sodium 134 (*)    Chloride 95 (*)  Glucose, Bld 419 (*)    BUN 61 (*)    Creatinine, Ser 1.52 (*)    Calcium 11.0 (*)    Albumin 3.0 (*)    GFR, Estimated 35 (*)    All other components within normal limits  CBC WITH DIFFERENTIAL/PLATELET - Abnormal; Notable for the following components:   Neutro Abs 8.1 (*)    All other components within normal limits  PROTIME-INR  TYPE AND SCREEN      EKG ***  EKG Interpretation  Date/Time:    Ventricular Rate:    PR Interval:    QRS Duration:   QT Interval:    QTC Calculation:   R Axis:     Text Interpretation:           Imaging Studies ordered: I ordered imaging studies including *** I independently visualized and  interpreted imaging. I agree with the radiologist interpretation   Medicines ordered and prescription drug management: Meds ordered this encounter  Medications   iohexol (OMNIPAQUE) 350 MG/ML injection 80 mL    -I have reviewed the patients home medicines and have made adjustments as needed  Critical interventions ***  Consultations Obtained: I requested consultation with the ***,  and discussed lab and imaging findings as well as pertinent plan - they recommend: ***   Cardiac Monitoring: The patient was maintained on a cardiac monitor.  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 Sonny Dandy)   Anginal pain ([place])    DR Theola Sequin     Anxiety    Asthma    AS CHILD    Blurred vision    Coronary artery disease    a. s/p stent to lad 2006 w subsequent kissing balloon pci to diagonal;  b.  pci of the lad w a drug-eluting stent July 2010;  c.  LHC 11/13/11: p-mLAD stent with prox 50% ISR, 80% beyond dist edge of stent, m+dLAD beyond stent 40-50%, mod caliber pD1 80%, mRCA 70%, m-dRCA 20-30%, EF 55-65% => med Rx (consider CABG)   Depressed    Diabetes mellitus    Dyslipidemia    Fibromyalgia    Gastroparesis    GERD (gastroesophageal reflux disease)    Headache(784.0)    Hiatal hernia    Hypertension    Morbid obesity ([place])    Osteoarthritis    Parkinson disease    ambulates with cane   Sleep apnea    12+ YRS NO MACHINE GOTTEN    Varicose vein        Dispostion: I considered admission for this patient, ***     Final Clinical Impression(s) / ED Diagnoses Final diagnoses:  None     '@PCDICTATION'$ @

## 2021-12-19 NOTE — ED Triage Notes (Signed)
Patient from [facility] with reports of rectal bleeding which started 30 minutes prior to arrival to ED. Noted with such in brief. Patient with advanced Dementia and unable to answer questions appropriately. DSS is legal guardian.

## 2021-12-19 NOTE — ED Notes (Signed)
Mittens placed on patient at this time due to fidgeting and pulling at cords.

## 2021-12-19 NOTE — ED Notes (Signed)
Incontinent care provided at this time. [place] placed.

## 2021-12-20 ENCOUNTER — Encounter: Payer: Self-pay | Admitting: Gastroenterology

## 2022-01-21 ENCOUNTER — Ambulatory Visit: Payer: Medicare Other | Admitting: Gastroenterology

## 2022-01-23 ENCOUNTER — Ambulatory Visit: Payer: Medicare Other | Admitting: Gastroenterology

## 2022-01-25 DEATH — deceased
